# Patient Record
Sex: Female | Born: 1965 | Race: White | Hispanic: No | Marital: Married | Smoking: Never smoker
Health system: Southern US, Community
[De-identification: ages and names within clinical notes are randomized; demographics above are authoritative.]

## PROBLEM LIST (undated history)

## (undated) DIAGNOSIS — N946 Dysmenorrhea, unspecified: Secondary | ICD-10-CM

## (undated) DIAGNOSIS — F988 Other specified behavioral and emotional disorders with onset usually occurring in childhood and adolescence: Secondary | ICD-10-CM

## (undated) DIAGNOSIS — T7840XA Allergy, unspecified, initial encounter: Secondary | ICD-10-CM

## (undated) DIAGNOSIS — F419 Anxiety disorder, unspecified: Secondary | ICD-10-CM

## (undated) HISTORY — DX: Allergy, unspecified, initial encounter: T78.40XA

## (undated) HISTORY — DX: Other specified behavioral and emotional disorders with onset usually occurring in childhood and adolescence: F98.8

## (undated) HISTORY — DX: Anxiety disorder, unspecified: F41.9

## (undated) HISTORY — PX: BREAST BIOPSY: SHX20

## (undated) HISTORY — DX: Dysmenorrhea, unspecified: N94.6

---

## 1997-11-30 ENCOUNTER — Other Ambulatory Visit: Admission: RE | Admit: 1997-11-30 | Discharge: 1997-11-30 | Payer: Self-pay | Admitting: Obstetrics and Gynecology

## 1998-12-20 ENCOUNTER — Other Ambulatory Visit: Admission: RE | Admit: 1998-12-20 | Discharge: 1998-12-20 | Payer: Self-pay | Admitting: Obstetrics and Gynecology

## 1999-12-23 ENCOUNTER — Other Ambulatory Visit: Admission: RE | Admit: 1999-12-23 | Discharge: 1999-12-23 | Payer: Self-pay | Admitting: Obstetrics and Gynecology

## 2001-02-11 ENCOUNTER — Other Ambulatory Visit: Admission: RE | Admit: 2001-02-11 | Discharge: 2001-02-11 | Payer: Self-pay | Admitting: Obstetrics and Gynecology

## 2002-11-04 ENCOUNTER — Other Ambulatory Visit: Admission: RE | Admit: 2002-11-04 | Discharge: 2002-11-04 | Payer: Self-pay | Admitting: Obstetrics and Gynecology

## 2003-12-12 ENCOUNTER — Ambulatory Visit: Payer: Self-pay | Admitting: Internal Medicine

## 2004-03-11 ENCOUNTER — Ambulatory Visit: Payer: Self-pay | Admitting: Internal Medicine

## 2004-03-11 ENCOUNTER — Ambulatory Visit (HOSPITAL_COMMUNITY): Admission: RE | Admit: 2004-03-11 | Discharge: 2004-03-11 | Payer: Self-pay | Admitting: Internal Medicine

## 2004-04-10 ENCOUNTER — Ambulatory Visit: Payer: Self-pay | Admitting: Internal Medicine

## 2004-08-13 ENCOUNTER — Other Ambulatory Visit: Admission: RE | Admit: 2004-08-13 | Discharge: 2004-08-13 | Payer: Self-pay | Admitting: Obstetrics and Gynecology

## 2004-08-14 ENCOUNTER — Ambulatory Visit: Payer: Self-pay | Admitting: Internal Medicine

## 2004-11-08 ENCOUNTER — Ambulatory Visit (HOSPITAL_COMMUNITY): Admission: RE | Admit: 2004-11-08 | Discharge: 2004-11-08 | Payer: Self-pay | Admitting: Obstetrics and Gynecology

## 2004-11-15 ENCOUNTER — Ambulatory Visit: Payer: Self-pay | Admitting: Internal Medicine

## 2005-01-08 ENCOUNTER — Ambulatory Visit: Payer: Self-pay | Admitting: Internal Medicine

## 2005-05-14 ENCOUNTER — Ambulatory Visit: Payer: Self-pay | Admitting: Internal Medicine

## 2005-09-25 ENCOUNTER — Ambulatory Visit: Payer: Self-pay | Admitting: Internal Medicine

## 2005-11-13 ENCOUNTER — Other Ambulatory Visit: Admission: RE | Admit: 2005-11-13 | Discharge: 2005-11-13 | Payer: Self-pay | Admitting: Obstetrics and Gynecology

## 2006-01-13 HISTORY — PX: BREAST BIOPSY: SHX20

## 2006-02-17 ENCOUNTER — Ambulatory Visit: Payer: Self-pay | Admitting: Internal Medicine

## 2006-03-24 ENCOUNTER — Ambulatory Visit (HOSPITAL_COMMUNITY): Admission: RE | Admit: 2006-03-24 | Discharge: 2006-03-24 | Payer: Self-pay | Admitting: Obstetrics and Gynecology

## 2006-03-31 ENCOUNTER — Encounter: Admission: RE | Admit: 2006-03-31 | Discharge: 2006-03-31 | Payer: Self-pay | Admitting: Obstetrics and Gynecology

## 2006-04-01 ENCOUNTER — Encounter: Admission: RE | Admit: 2006-04-01 | Discharge: 2006-04-01 | Payer: Self-pay | Admitting: Obstetrics and Gynecology

## 2006-04-01 ENCOUNTER — Encounter (INDEPENDENT_AMBULATORY_CARE_PROVIDER_SITE_OTHER): Payer: Self-pay | Admitting: *Deleted

## 2006-06-29 ENCOUNTER — Ambulatory Visit: Payer: Self-pay | Admitting: Internal Medicine

## 2006-11-18 ENCOUNTER — Encounter: Payer: Self-pay | Admitting: Internal Medicine

## 2006-11-18 DIAGNOSIS — F988 Other specified behavioral and emotional disorders with onset usually occurring in childhood and adolescence: Secondary | ICD-10-CM | POA: Insufficient documentation

## 2006-11-18 DIAGNOSIS — J309 Allergic rhinitis, unspecified: Secondary | ICD-10-CM | POA: Insufficient documentation

## 2006-12-22 ENCOUNTER — Ambulatory Visit: Payer: Self-pay | Admitting: Internal Medicine

## 2007-03-23 ENCOUNTER — Ambulatory Visit: Payer: Self-pay | Admitting: Internal Medicine

## 2007-07-08 ENCOUNTER — Telehealth (INDEPENDENT_AMBULATORY_CARE_PROVIDER_SITE_OTHER): Payer: Self-pay | Admitting: *Deleted

## 2007-07-23 ENCOUNTER — Ambulatory Visit: Payer: Self-pay | Admitting: Internal Medicine

## 2007-10-19 ENCOUNTER — Ambulatory Visit: Payer: Self-pay | Admitting: Internal Medicine

## 2007-10-20 LAB — CONVERTED CEMR LAB
ALT: 14 units/L (ref 0–35)
AST: 22 units/L (ref 0–37)
Albumin: 4.3 g/dL (ref 3.5–5.2)
Alkaline Phosphatase: 39 units/L (ref 39–117)
BUN: 8 mg/dL (ref 6–23)
Basophils Absolute: 0.1 10*3/uL (ref 0.0–0.1)
Basophils Relative: 1.3 % (ref 0.0–3.0)
Bilirubin, Direct: 0.2 mg/dL (ref 0.0–0.3)
CO2: 31 meq/L (ref 19–32)
Calcium: 9.2 mg/dL (ref 8.4–10.5)
Chloride: 103 meq/L (ref 96–112)
Cholesterol: 201 mg/dL (ref 0–200)
Creatinine, Ser: 0.8 mg/dL (ref 0.4–1.2)
Direct LDL: 89.9 mg/dL
Eosinophils Absolute: 0.1 10*3/uL (ref 0.0–0.7)
Eosinophils Relative: 0.8 % (ref 0.0–5.0)
GFR calc Af Amer: 100 mL/min
GFR calc non Af Amer: 83 mL/min
Glucose, Bld: 105 mg/dL — ABNORMAL HIGH (ref 70–99)
HCT: 37.8 % (ref 36.0–46.0)
HDL: 88.5 mg/dL (ref >39.0)
Hemoglobin: 13.4 g/dL (ref 12.0–15.0)
Lymphocytes Relative: 35.9 % (ref 12.0–46.0)
MCHC: 35.6 g/dL (ref 30.0–36.0)
MCV: 92.4 fL (ref 78.0–100.0)
Monocytes Absolute: 0.4 10*3/uL (ref 0.1–1.0)
Monocytes Relative: 5.7 % (ref 3.0–12.0)
Neutro Abs: 4 10*3/uL (ref 1.4–7.7)
Neutrophils Relative %: 56.3 % (ref 43.0–77.0)
Platelets: 395 10*3/uL (ref 150–400)
Potassium: 4.2 meq/L (ref 3.5–5.1)
RBC: 4.08 M/uL (ref 3.87–5.11)
RDW: 11.3 % — ABNORMAL LOW (ref 11.5–14.6)
Sodium: 139 meq/L (ref 135–145)
TSH: 1.94 microintl units/mL (ref 0.35–5.50)
Total Bilirubin: 0.8 mg/dL (ref 0.3–1.2)
Total CHOL/HDL Ratio: 2.3
Total Protein: 7 g/dL (ref 6.0–8.3)
Triglycerides: 20 mg/dL (ref 0–149)
VLDL: 4 mg/dL (ref 0–40)
WBC: 7.1 10*3/uL (ref 4.5–10.5)

## 2007-10-25 ENCOUNTER — Ambulatory Visit: Payer: Self-pay | Admitting: Internal Medicine

## 2008-03-13 ENCOUNTER — Ambulatory Visit: Payer: Self-pay | Admitting: Internal Medicine

## 2008-03-13 DIAGNOSIS — L719 Rosacea, unspecified: Secondary | ICD-10-CM | POA: Insufficient documentation

## 2008-03-13 DIAGNOSIS — R21 Rash and other nonspecific skin eruption: Secondary | ICD-10-CM | POA: Insufficient documentation

## 2008-06-26 ENCOUNTER — Ambulatory Visit: Payer: Self-pay | Admitting: Internal Medicine

## 2008-06-30 ENCOUNTER — Telehealth: Payer: Self-pay | Admitting: Internal Medicine

## 2008-07-04 ENCOUNTER — Telehealth: Payer: Self-pay | Admitting: Internal Medicine

## 2008-10-24 ENCOUNTER — Ambulatory Visit: Payer: Self-pay | Admitting: Internal Medicine

## 2009-01-13 HISTORY — PX: LASER ABLATION: SHX1947

## 2009-02-05 ENCOUNTER — Ambulatory Visit: Payer: Self-pay | Admitting: Internal Medicine

## 2009-05-09 ENCOUNTER — Ambulatory Visit: Payer: Self-pay | Admitting: Internal Medicine

## 2009-05-10 ENCOUNTER — Ambulatory Visit: Payer: Self-pay | Admitting: Obstetrics and Gynecology

## 2009-05-10 ENCOUNTER — Other Ambulatory Visit: Admission: RE | Admit: 2009-05-10 | Discharge: 2009-05-10 | Payer: Self-pay | Admitting: Obstetrics and Gynecology

## 2009-05-22 ENCOUNTER — Ambulatory Visit: Payer: Self-pay | Admitting: Obstetrics and Gynecology

## 2009-06-08 ENCOUNTER — Ambulatory Visit: Payer: Self-pay | Admitting: Obstetrics and Gynecology

## 2009-06-29 ENCOUNTER — Ambulatory Visit: Payer: Self-pay | Admitting: Obstetrics and Gynecology

## 2009-07-04 ENCOUNTER — Ambulatory Visit: Payer: Self-pay | Admitting: Obstetrics and Gynecology

## 2009-07-04 HISTORY — PX: ENDOMETRIAL ABLATION: SHX621

## 2009-07-19 ENCOUNTER — Ambulatory Visit: Payer: Self-pay | Admitting: Obstetrics and Gynecology

## 2009-09-11 ENCOUNTER — Ambulatory Visit: Payer: Self-pay | Admitting: Internal Medicine

## 2009-09-11 LAB — CONVERTED CEMR LAB
Cholesterol, target level: 200 mg/dL
HDL goal, serum: 40 mg/dL
LDL Goal: 160 mg/dL

## 2009-12-13 ENCOUNTER — Ambulatory Visit: Payer: Self-pay | Admitting: Internal Medicine

## 2010-02-12 NOTE — Assessment & Plan Note (Signed)
Summary: 3 MO ROV /NWS #   Vital Signs:  Patient profile:   45 year old female Weight:      144 pounds Temp:     98.4 degrees F oral Pulse rate:   83 / minute BP sitting:   116 / 72  (left arm)  Vitals Entered By: Tora Perches (February 05, 2009 2:55 PM) CC: f/u Is Patient Diabetic? No   CC:  f/u.  History of Present Illness: The patient presents for a follow up of ADD.   Preventive Screening-Counseling & Management  Alcohol-Tobacco     Smoking Status: never  Current Medications (verified): 1)  Adderall 20 Mg  Tabs (Amphetamine-Dextroamphetamine) .... Take 1 By Mouth Two Times A Day Please Fill On or After 12/24/08 2)  Prozac 20 Mg  Caps (Fluoxetine Hcl) .Marland Kitchen.. 1 By Mouth Once Daily Prn 3)  Tramadol Hcl 50 Mg  Tabs (Tramadol Hcl) .Marland Kitchen.. 1or2 Two Times A Day  Prn 4)  Loratadine 10 Mg  Tabs (Loratadine) .... Once Daily As Needed Allergies 5)  Vitamin D3 1000 Unit  Tabs (Cholecalciferol) .Marland Kitchen.. 1 Qd 6)  Triamcinolone Acetonide 0.5 % Crea (Triamcinolone Acetonide) .... Apply Bid To Affected Area 7)  Clindamycin Phosphate 1 % Lotn (Clindamycin Phosphate) .... Use Two Times A Day On Face 8)  Singulair 10 Mg Tabs (Montelukast Sodium) .Marland Kitchen.. 1 By Mouth Daily  Allergies (verified): No Known Drug Allergies  Past History:  Past Medical History: Last updated: 12/22/2006 Allergic rhinitis Osteoporosis ADD Menstrual cramps  Social History: Last updated: 12/22/2006 Occupation: Insurance Married Former Smoker  Past Surgical History: Denies surgical history  Review of Systems  The patient denies fever, chest pain, syncope, dyspnea on exertion, and abdominal pain.    Physical Exam  General:  Well-developed,well-nourished,in no acute distress; alert,appropriate and cooperative throughout examination Eyes:  No corneal or conjunctival inflammation noted. EOMI. Perrla.  Nose:  External nasal examination shows no deformity or inflammation. Nasal mucosa are pink and moist without  lesions or exudates. Mouth:  WNL Lungs:  Normal respiratory effort, chest expands symmetrically. Lungs are clear to auscultation, no crackles or wheezes. Heart:  Slight tachy Abdomen:  Bowel sounds positive,abdomen soft and non-tender without masses, organomegaly or hernias noted. Msk:  No deformity or scoliosis noted of thoracic or lumbar spine.   Neurologic:  No cranial nerve deficits noted. Station and gait are normal. Plantar reflexes are down-going bilaterally. DTRs are symmetrical throughout. Sensory, motor and coordinative functions appear intact. Skin:  WNL Psych:  Cognition and judgment appear intact. Alert and cooperative with normal attention span and concentration. No apparent delusions, illusions, hallucinations   Impression & Recommendations:  Problem # 1:  ATTENTION DEFICIT DISORDER (ICD-314.00) Assessment Unchanged On prescription therapy   Problem # 2:  MENSTRUAL PAIN (ICD-625.3) Assessment: Comment Only On prescription therapy   Complete Medication List: 1)  Adderall 20 Mg Tabs (Amphetamine-dextroamphetamine) .... Take 1 by mouth two times a day please fill on or after 03/24/09 2)  Prozac 20 Mg Caps (Fluoxetine hcl) .Marland Kitchen.. 1 by mouth once daily prn 3)  Tramadol Hcl 50 Mg Tabs (Tramadol hcl) .Marland Kitchen.. 1or2 two times a day  prn 4)  Loratadine 10 Mg Tabs (Loratadine) .... Once daily as needed allergies 5)  Vitamin D3 1000 Unit Tabs (Cholecalciferol) .Marland Kitchen.. 1 qd 6)  Triamcinolone Acetonide 0.5 % Crea (Triamcinolone acetonide) .... Apply bid to affected area 7)  Clindamycin Phosphate 1 % Lotn (Clindamycin phosphate) .... Use two times a day on face 8)  Singulair  10 Mg Tabs (Montelukast sodium) .Marland Kitchen.. 1 by mouth daily  Patient Instructions: 1)  Please schedule a follow-up appointment in 3 months. Prescriptions: ADDERALL 20 MG  TABS (AMPHETAMINE-DEXTROAMPHETAMINE) take 1 by mouth two times a day Please fill on or after 03/24/09  #60 x 0   Entered and Authorized by:   Tresa Garter MD   Signed by:   Tresa Garter MD on 02/05/2009   Method used:   Print then Give to Patient   RxID:   [id number] ADDERALL 20 MG  TABS (AMPHETAMINE-DEXTROAMPHETAMINE) take 1 by mouth two times a day Please fill on or after 04/24/09  #60 x 0   Entered and Authorized by:   Tresa Garter MD   Signed by:   Tresa Garter MD on 02/05/2009   Method used:   Print then Give to Patient   RxID:   [id number] ADDERALL 20 MG  TABS (AMPHETAMINE-DEXTROAMPHETAMINE) take 1 by mouth two times a day Please fill on or after 02/24/09  #60 x 0   Entered and Authorized by:   Tresa Garter MD   Signed by:   Tresa Garter MD on 02/05/2009   Method used:   Print then Give to Patient   RxID:   [id number] ADDERALL 20 MG  TABS (AMPHETAMINE-DEXTROAMPHETAMINE) take 1 by mouth two times a day Please fill on or after 02/25/08  #60 x 0   Entered and Authorized by:   Tresa Garter MD   Signed by:   Tresa Garter MD on 02/05/2009   Method used:   Print then Give to Patient   RxID:   [id number]

## 2010-02-12 NOTE — Assessment & Plan Note (Signed)
Summary: 3 MO ROV /NWS  #   Vital Signs:  Patient profile:   45 year old female Height:      67 inches Weight:      145.38 pounds BMI:     22.85 O2 Sat:      97 % on Room air Temp:     97.8 degrees F oral Pulse rate:   95 / minute BP sitting:   90 / 56  (left arm)  Vitals Entered By: Lucious Groves (May 09, 2009 9:41 AM)  O2 Flow:  Room air CC: 3 mo rtn ov./kb Is Patient Diabetic? No Pain Assessment Patient in pain? no        CC:  3 mo rtn ov./kb.  History of Present Illness: The patient presents for a follow up of ADD, depression C/o allergies - bad   Current Medications (verified): 1)  Adderall 20 Mg  Tabs (Amphetamine-Dextroamphetamine) .... Take 1 By Mouth Two Times A Day Please Fill On or After 03/24/09 2)  Prozac 20 Mg  Caps (Fluoxetine Hcl) .Marland Kitchen.. 1 By Mouth Once Daily Prn 3)  Tramadol Hcl 50 Mg  Tabs (Tramadol Hcl) .Marland Kitchen.. 1or2 Two Times A Day  Prn 4)  Loratadine 10 Mg  Tabs (Loratadine) .... Once Daily As Needed Allergies 5)  Vitamin D3 1000 Unit  Tabs (Cholecalciferol) .Marland Kitchen.. 1 Qd 6)  Triamcinolone Acetonide 0.5 % Crea (Triamcinolone Acetonide) .... Apply Bid To Affected Area 7)  Clindamycin Phosphate 1 % Lotn (Clindamycin Phosphate) .... Use Two Times A Day On Face 8)  Singulair 10 Mg Tabs (Montelukast Sodium) .Marland Kitchen.. 1 By Mouth Daily  Allergies (verified): No Known Drug Allergies  Past History:  Past Medical History: Last updated: 12/22/2006 Allergic rhinitis Osteoporosis ADD Menstrual cramps  Social History: Last updated: 12/22/2006 Occupation: Insurance Married Former Smoker  Review of Systems  The patient denies chest pain and dyspnea on exertion.    Physical Exam  General:  Well-developed,well-nourished,in no acute distress; alert,appropriate and cooperative throughout examination Nose:  External nasal examination shows no deformity or inflammation. Nasal mucosa are pink and moist without lesions or exudates. Mouth:  WNL Lungs:  Normal  respiratory effort, chest expands symmetrically. Lungs are clear to auscultation, no crackles or wheezes. Heart:  Slight tachy Abdomen:  Bowel sounds positive,abdomen soft and non-tender without masses, organomegaly or hernias noted. Msk:  No deformity or scoliosis noted of thoracic or lumbar spine.   Neurologic:  No cranial nerve deficits noted. Station and gait are normal. Plantar reflexes are down-going bilaterally. DTRs are symmetrical throughout. Sensory, motor and coordinative functions appear intact. Skin:  WNL Psych:  Cognition and judgment appear intact. Alert and cooperative with normal attention span and concentration. No apparent delusions, illusions, hallucinations   Impression & Recommendations:  Problem # 1:  ALLERGIC RHINITIS (ICD-477.9) Assessment Deteriorated  Her updated medication list for this problem includes:    Loratadine 10 Mg Tabs (Loratadine) ..... Once daily as needed allergies  Problem # 2:  ATTENTION DEFICIT DISORDER (ICD-314.00) Assessment: Unchanged Labs w/GYN Rx given  Problem # 3:  MENSTRUAL PAIN (ICD-625.3) Assessment: Unchanged On Rx PRN  Complete Medication List: 1)  Adderall 20 Mg Tabs (Amphetamine-dextroamphetamine) .... Take 1 by mouth two times a day please fill on or after 07/24/09 2)  Prozac 20 Mg Caps (Fluoxetine hcl) .Marland Kitchen.. 1 by mouth once daily prn 3)  Tramadol Hcl 50 Mg Tabs (Tramadol hcl) .Marland Kitchen.. 1or2 two times a day  prn 4)  Loratadine 10 Mg Tabs (Loratadine) .... Once  daily as needed allergies 5)  Vitamin D3 1000 Unit Tabs (Cholecalciferol) .Marland Kitchen.. 1 qd 6)  Triamcinolone Acetonide 0.5 % Crea (Triamcinolone acetonide) .... Apply bid to affected area 7)  Clindamycin Phosphate 1 % Lotn (Clindamycin phosphate) .... Use two times a day on face 8)  Singulair 10 Mg Tabs (Montelukast sodium) .Marland Kitchen.. 1 by mouth daily  Patient Instructions: 1)  Please schedule a follow-up appointment in 3 months. Prescriptions: SINGULAIR 10 MG TABS (MONTELUKAST  SODIUM) 1 by mouth daily  #30 x 6   Entered and Authorized by:   Tresa Garter MD   Signed by:   Tresa Garter MD on 05/09/2009   Method used:   Print then Give to Patient   RxID:   [id number] TRAMADOL HCL 50 MG  TABS (TRAMADOL HCL) 1or2 two times a day  prn  #120 x 3   Entered and Authorized by:   Tresa Garter MD   Signed by:   Tresa Garter MD on 05/09/2009   Method used:   Print then Give to Patient   RxID:   [id number] PROZAC 20 MG  CAPS (FLUOXETINE HCL) 1 by mouth once daily prn  #90 x 0   Entered and Authorized by:   Tresa Garter MD   Signed by:   Tresa Garter MD on 05/09/2009   Method used:   Print then Give to Patient   RxID:   [id number] ADDERALL 20 MG  TABS (AMPHETAMINE-DEXTROAMPHETAMINE) take 1 by mouth two times a day Please fill on or after 07/24/09  #60 x 0   Entered and Authorized by:   Tresa Garter MD   Signed by:   Tresa Garter MD on 05/09/2009   Method used:   Print then Give to Patient   RxID:   [id number] ADDERALL 20 MG  TABS (AMPHETAMINE-DEXTROAMPHETAMINE) take 1 by mouth two times a day Please fill on or after 06/24/09  #60 x 0   Entered and Authorized by:   Tresa Garter MD   Signed by:   Tresa Garter MD on 05/09/2009   Method used:   Print then Give to Patient   RxID:   [id number] ADDERALL 20 MG  TABS (AMPHETAMINE-DEXTROAMPHETAMINE) take 1 by mouth two times a day Please fill on or after 05/24/09  #60 x 0   Entered and Authorized by:   Tresa Garter MD   Signed by:   Tresa Garter MD on 05/09/2009   Method used:   Print then Give to Patient   RxID:   [id number]

## 2010-02-12 NOTE — Assessment & Plan Note (Signed)
Summary: 3 mos f/u #/ cd   Vital Signs:  Patient profile:   45 year old female Height:      67 inches (170.18 cm) Weight:      147.50 pounds (67.05 kg) BMI:     23.19 O2 Sat:      98 % on Room air Temp:     98.4 degrees F (36.89 degrees C) oral Pulse rate:   93 / minute BP sitting:   110 / 70  (left arm) Cuff size:   regular  Vitals Entered By: Lucious Groves CMA (September 11, 2009 9:23 AM)  O2 Flow:  Room air CC: 3 mo fu./kb, Lipid Management Is Patient Diabetic? No Pain Assessment Patient in pain? no        CC:  3 mo fu./kb and Lipid Management.  History of Present Illness: F/u ADD  Lipid Management History:      Negative NCEP/ATP III risk factors include female age less than 36 years old, HDL cholesterol greater than 60, and non-tobacco-user status.    Current Medications (verified): 1)  Adderall 20 Mg  Tabs (Amphetamine-Dextroamphetamine) .... Take 1 By Mouth Two Times A Day Please Fill On or After 07/24/09 2)  Prozac 20 Mg  Caps (Fluoxetine Hcl) .Marland Kitchen.. 1 By Mouth Once Daily Prn 3)  Tramadol Hcl 50 Mg  Tabs (Tramadol Hcl) .Marland Kitchen.. 1or2 Two Times A Day  Prn 4)  Loratadine 10 Mg  Tabs (Loratadine) .... Once Daily As Needed Allergies 5)  Vitamin D3 1000 Unit  Tabs (Cholecalciferol) .Marland Kitchen.. 1 Qd 6)  Triamcinolone Acetonide 0.5 % Crea (Triamcinolone Acetonide) .... Apply Bid To Affected Area 7)  Clindamycin Phosphate 1 % Lotn (Clindamycin Phosphate) .... Use Two Times A Day On Face 8)  Singulair 10 Mg Tabs (Montelukast Sodium) .Marland Kitchen.. 1 By Mouth Daily  Allergies (verified): No Known Drug Allergies  Past History:  Past Medical History: Last updated: 12/22/2006 Allergic rhinitis Osteoporosis ADD Menstrual cramps  Social History: Last updated: 12/22/2006 Occupation: Insurance Married Former Smoker  Past Surgical History: Uterine ablation 2011 Dr Arnette Norris  Physical Exam  General:  Well-developed,well-nourished,in no acute distress; alert,appropriate and cooperative  throughout examination Mouth:  WNL Lungs:  Normal respiratory effort, chest expands symmetrically. Lungs are clear to auscultation, no crackles or wheezes. Heart:  Slight tachy Abdomen:  Bowel sounds positive,abdomen soft and non-tender without masses, organomegaly or hernias noted. Msk:  No deformity or scoliosis noted of thoracic or lumbar spine.   Neurologic:  No cranial nerve deficits noted. Station and gait are normal. Plantar reflexes are down-going bilaterally. DTRs are symmetrical throughout. Sensory, motor and coordinative functions appear intact.   Impression & Recommendations:  Problem # 1:  ATTENTION DEFICIT DISORDER (ICD-314.00) Assessment Unchanged On the regimen of medicine(s) reflected in the chart    Problem # 2:  MENSTRUAL PAIN (ICD-625.3) Assessment: Improved Had ablation and PAP and labs w/Dr Eda Paschal  Complete Medication List: 1)  Adderall 20 Mg Tabs (Amphetamine-dextroamphetamine) .... Take 1 by mouth two times a day please fill on or after 10/24/09 2)  Prozac 20 Mg Caps (Fluoxetine hcl) .Marland Kitchen.. 1 by mouth once daily prn 3)  Tramadol Hcl 50 Mg Tabs (Tramadol hcl) .Marland Kitchen.. 1or2 two times a day  prn 4)  Loratadine 10 Mg Tabs (Loratadine) .... Once daily as needed allergies 5)  Vitamin D3 1000 Unit Tabs (Cholecalciferol) .Marland Kitchen.. 1 qd 6)  Triamcinolone Acetonide 0.5 % Crea (Triamcinolone acetonide) .... Apply bid to affected area 7)  Clindamycin Phosphate 1 % Lotn (Clindamycin  phosphate) .... Use two times a day on face 8)  Singulair 10 Mg Tabs (Montelukast sodium) .Marland Kitchen.. 1 by mouth daily  Lipid Assessment/Plan:      Based on NCEP/ATP III, the patient's risk factor category is "0-1 risk factors".  The patient's lipid goals are as follows: Total cholesterol goal is 200; LDL cholesterol goal is 160; HDL cholesterol goal is 40; Triglyceride goal is 150.     Patient Instructions: 1)  Please schedule a follow-up appointment in 3 months. Prescriptions: ADDERALL 20 MG  TABS  (AMPHETAMINE-DEXTROAMPHETAMINE) take 1 by mouth two times a day Please fill on or after 10/24/09  #60 x 0   Entered and Authorized by:   Tresa Garter MD   Signed by:   Tresa Garter MD on 09/11/2009   Method used:   Print then Give to Patient   RxID:   [id number] ADDERALL 20 MG  TABS (AMPHETAMINE-DEXTROAMPHETAMINE) take 1 by mouth two times a day Please fill on or after 09/24/09  #60 x 0   Entered and Authorized by:   Tresa Garter MD   Signed by:   Tresa Garter MD on 09/11/2009   Method used:   Print then Give to Patient   RxID:   [id number] ADDERALL 20 MG  TABS (AMPHETAMINE-DEXTROAMPHETAMINE) take 1 by mouth two times a day Please fill on or after 08/24/09  #60 x 0   Entered and Authorized by:   Tresa Garter MD   Signed by:   Tresa Garter MD on 09/11/2009   Method used:   Print then Give to Patient   RxID:   [id number]     Contraindications/Deferment of Procedures/Staging:    Test/Procedure: FLU VAX    Reason for deferment: patient declined

## 2010-02-12 NOTE — Assessment & Plan Note (Signed)
Summary: 3 MO ROV /NWS #   Vital Signs:  Patient profile:   45 year old female Height:      67 inches Weight:      150 pounds BMI:     23.58 Temp:     98.5 degrees F oral Pulse rate:   80 / minute Pulse rhythm:   regular Resp:     16 per minute BP sitting:   100 / 70  (left arm) Cuff size:   regular  Vitals Entered By: Lanier Prude, CMA(AAMA) (December 13, 2009 10:13 AM) CC: 3 mo f/u    CC:  3 mo f/u .  History of Present Illness: The patient presents for a follow up of ADD  Current Medications (verified): 1)  Adderall 20 Mg  Tabs (Amphetamine-Dextroamphetamine) .... Take 1 By Mouth Two Times A Day Please Fill On or After 10/24/09 2)  Prozac 20 Mg  Caps (Fluoxetine Hcl) .Marland Kitchen.. 1 By Mouth Once Daily Prn 3)  Tramadol Hcl 50 Mg  Tabs (Tramadol Hcl) .Marland Kitchen.. 1or2 Two Times A Day  Prn 4)  Loratadine 10 Mg  Tabs (Loratadine) .... Once Daily As Needed Allergies 5)  Vitamin D3 1000 Unit  Tabs (Cholecalciferol) .Marland Kitchen.. 1 Qd 6)  Triamcinolone Acetonide 0.5 % Crea (Triamcinolone Acetonide) .... Apply Bid To Affected Area 7)  Clindamycin Phosphate 1 % Lotn (Clindamycin Phosphate) .... Use Two Times A Day On Face 8)  Singulair 10 Mg Tabs (Montelukast Sodium) .Marland Kitchen.. 1 By Mouth Daily  Allergies (verified): No Known Drug Allergies  Past History:  Past Medical History: Last updated: 12/22/2006 Allergic rhinitis Osteoporosis ADD Menstrual cramps  Social History: Last updated: 12/22/2006 Occupation: Insurance Married Former Smoker  Review of Systems  The patient denies fever, chest pain, syncope, dyspnea on exertion, and depression.    Physical Exam  General:  Well-developed,well-nourished,in no acute distress; alert,appropriate and cooperative throughout examination Mouth:  WNL Lungs:  Normal respiratory effort, chest expands symmetrically. Lungs are clear to auscultation, no crackles or wheezes. Heart:  Slight tachy Abdomen:  Bowel sounds positive,abdomen soft and non-tender  without masses, organomegaly or hernias noted. Msk:  No deformity or scoliosis noted of thoracic or lumbar spine.   Neurologic:  No cranial nerve deficits noted. Station and gait are normal. Plantar reflexes are down-going bilaterally. DTRs are symmetrical throughout. Sensory, motor and coordinative functions appear intact. Skin:  WNL Psych:  Cognition and judgment appear intact. Alert and cooperative with normal attention span and concentration. No apparent delusions, illusions, hallucinations   Impression & Recommendations:  Problem # 1:  ATTENTION DEFICIT DISORDER (ICD-314.00) Assessment Unchanged She had well visit and labs w/her GYN  Complete Medication List: 1)  Adderall 20 Mg Tabs (Amphetamine-dextroamphetamine) .... Take 1 by mouth two times a day please fill on or after 01/24/10 2)  Prozac 20 Mg Caps (Fluoxetine hcl) .Marland Kitchen.. 1 by mouth once daily prn 3)  Tramadol Hcl 50 Mg Tabs (Tramadol hcl) .Marland Kitchen.. 1or2 two times a day  prn 4)  Loratadine 10 Mg Tabs (Loratadine) .... Once daily as needed allergies 5)  Vitamin D3 1000 Unit Tabs (Cholecalciferol) .Marland Kitchen.. 1 qd 6)  Triamcinolone Acetonide 0.5 % Crea (Triamcinolone acetonide) .... Apply bid to affected area 7)  Clindamycin Phosphate 1 % Lotn (Clindamycin phosphate) .... Use two times a day on face 8)  Singulair 10 Mg Tabs (Montelukast sodium) .Marland Kitchen.. 1 by mouth daily  Patient Instructions: 1)  Please schedule a follow-up appointment in 3 months. Prescriptions: ADDERALL 20 MG  TABS (  AMPHETAMINE-DEXTROAMPHETAMINE) take 1 by mouth two times a day Please fill on or after 01/24/10  #60 x 0   Entered and Authorized by:   Tresa Garter MD   Signed by:   Tresa Garter MD on 12/13/2009   Method used:   Print then Give to Patient   RxID:   [id number] ADDERALL 20 MG  TABS (AMPHETAMINE-DEXTROAMPHETAMINE) take 1 by mouth two times a day Please fill on or after 12/24/09  #60 x 0   Entered and Authorized by:   Tresa Garter MD    Signed by:   Tresa Garter MD on 12/13/2009   Method used:   Print then Give to Patient   RxID:   [id number] ADDERALL 20 MG  TABS (AMPHETAMINE-DEXTROAMPHETAMINE) take 1 by mouth two times a day Please fill on or after 11/24/09  #60 x 0   Entered and Authorized by:   Tresa Garter MD   Signed by:   Tresa Garter MD on 12/13/2009   Method used:   Print then Give to Patient   RxID:   [id number]    Orders Added: 1)  Est. Patient Level III [76283]

## 2010-03-19 ENCOUNTER — Ambulatory Visit: Payer: Self-pay | Admitting: Internal Medicine

## 2010-04-08 ENCOUNTER — Encounter: Payer: Self-pay | Admitting: Internal Medicine

## 2010-04-08 ENCOUNTER — Ambulatory Visit (INDEPENDENT_AMBULATORY_CARE_PROVIDER_SITE_OTHER): Payer: PRIVATE HEALTH INSURANCE | Admitting: Internal Medicine

## 2010-04-08 DIAGNOSIS — J309 Allergic rhinitis, unspecified: Secondary | ICD-10-CM

## 2010-04-08 DIAGNOSIS — N946 Dysmenorrhea, unspecified: Secondary | ICD-10-CM | POA: Insufficient documentation

## 2010-04-08 DIAGNOSIS — F988 Other specified behavioral and emotional disorders with onset usually occurring in childhood and adolescence: Secondary | ICD-10-CM

## 2010-04-08 MED ORDER — FLUOXETINE HCL 20 MG PO CAPS: 20.0000 mg | ORAL_CAPSULE | Freq: Every day | ORAL | Status: DC

## 2010-04-08 MED ORDER — AMPHETAMINE-DEXTROAMPHETAMINE 20 MG PO TABS: 1.0000 | ORAL_TABLET | Freq: Two times a day (BID) | ORAL | Status: DC

## 2010-04-08 NOTE — Assessment & Plan Note (Signed)
Cont Rx 

## 2010-04-08 NOTE — Progress Notes (Signed)
  Subjective:    Patient ID: Heather Freeman, female    DOB: Aug 05, 1965, 45 y.o.   MRN: [id number]  HPI  F/u ADD, osteopenia, allergies and cramps  Review of Systems  Constitutional: Negative.  Negative for fever, chills, diaphoresis, activity change, appetite change, fatigue and unexpected weight change.  HENT: Negative for hearing loss, ear pain, nosebleeds, congestion, sore throat, facial swelling, rhinorrhea, sneezing, mouth sores, trouble swallowing, neck pain, neck stiffness, postnasal drip, sinus pressure and tinnitus.   Eyes: Negative for pain, discharge, redness, itching and visual disturbance.  Respiratory: Negative for cough, chest tightness, shortness of breath, wheezing and stridor.   Cardiovascular: Negative for chest pain, palpitations and leg swelling.       Tachycardia  Gastrointestinal: Negative for nausea, diarrhea, constipation, blood in stool, abdominal distention, anal bleeding and rectal pain.  Genitourinary: Negative for dysuria, urgency, frequency, hematuria, flank pain, vaginal bleeding, vaginal discharge, difficulty urinating, genital sores and pelvic pain.  Musculoskeletal: Negative for back pain, joint swelling, arthralgias and gait problem.  Skin: Negative.  Negative for rash.  Neurological: Negative for dizziness, tremors, seizures, syncope, speech difficulty, weakness, numbness and headaches.  Hematological: Negative for adenopathy. Does not bruise/bleed easily.  Psychiatric/Behavioral: Negative for suicidal ideas, behavioral problems, sleep disturbance, dysphoric mood and decreased concentration. The patient is not nervous/anxious.        Objective:   Physical Exam  Constitutional: She appears well-developed and well-nourished. No distress.  HENT:  Head: Normocephalic.  Right Ear: External ear normal.  Left Ear: External ear normal.  Nose: Nose normal.  Mouth/Throat: Oropharynx is clear and moist.  Eyes: Conjunctivae are normal. Pupils are equal,  round, and reactive to light. Right eye exhibits no discharge. Left eye exhibits no discharge.  Neck: Normal range of motion. Neck supple. No JVD present. No tracheal deviation present. No thyromegaly present.  Cardiovascular: Normal rate, regular rhythm and normal heart sounds.   Pulmonary/Chest: No stridor. No respiratory distress. She has no wheezes.  Abdominal: Soft. Bowel sounds are normal. She exhibits no distension and no mass. There is no tenderness. There is no rebound and no guarding.  Musculoskeletal: She exhibits no edema and no tenderness.  Lymphadenopathy:    She has no cervical adenopathy.  Neurological: She displays normal reflexes. No cranial nerve deficit. She exhibits normal muscle tone. Coordination normal.  Skin: No rash noted. No erythema.  Psychiatric: Her behavior is normal. Judgment and thought content normal. She does not express impulsivity or inappropriate judgment. She does not exhibit a depressed mood.          Assessment & Plan:  MENSTRUAL PAIN Cont Rx  ALLERGIC RHINITIS On Loratidine 10 mg daily  ATTENTION DEFICIT DISORDER Meds filled x 3 months    BP Readings from Last 3 Encounters:  04/08/10 104/70  12/13/09 100/70  09/11/09 110/70   Wt Readings from Last 3 Encounters:  04/08/10 153 lb (69.4 kg)  12/13/09 150 lb (68.04 kg)  09/11/09 147 lb 8 oz (66.906 kg)   .fu

## 2010-04-08 NOTE — Assessment & Plan Note (Signed)
On Loratidine 10 mg daily

## 2010-04-08 NOTE — Assessment & Plan Note (Signed)
Meds filled x 3 months

## 2010-07-09 ENCOUNTER — Ambulatory Visit (INDEPENDENT_AMBULATORY_CARE_PROVIDER_SITE_OTHER): Payer: PRIVATE HEALTH INSURANCE | Admitting: Internal Medicine

## 2010-07-09 ENCOUNTER — Encounter: Payer: Self-pay | Admitting: Internal Medicine

## 2010-07-09 DIAGNOSIS — S0003XA Contusion of scalp, initial encounter: Secondary | ICD-10-CM

## 2010-07-09 DIAGNOSIS — S0083XA Contusion of other part of head, initial encounter: Secondary | ICD-10-CM | POA: Insufficient documentation

## 2010-07-09 DIAGNOSIS — L719 Rosacea, unspecified: Secondary | ICD-10-CM

## 2010-07-09 DIAGNOSIS — H0019 Chalazion unspecified eye, unspecified eyelid: Secondary | ICD-10-CM

## 2010-07-09 DIAGNOSIS — N946 Dysmenorrhea, unspecified: Secondary | ICD-10-CM

## 2010-07-09 DIAGNOSIS — F988 Other specified behavioral and emotional disorders with onset usually occurring in childhood and adolescence: Secondary | ICD-10-CM

## 2010-07-09 DIAGNOSIS — Z23 Encounter for immunization: Secondary | ICD-10-CM

## 2010-07-09 DIAGNOSIS — H0011 Chalazion right upper eyelid: Secondary | ICD-10-CM | POA: Insufficient documentation

## 2010-07-09 MED ORDER — NEOMYCIN-POLYMYXIN-HC 3.5-10000-1 OT SOLN: 3.0000 [drp] | Freq: Three times a day (TID) | OTIC | Status: AC

## 2010-07-09 MED ORDER — AMPHETAMINE-DEXTROAMPHETAMINE 20 MG PO TABS: 1.0000 | ORAL_TABLET | Freq: Two times a day (BID) | ORAL | Status: DC

## 2010-07-09 NOTE — Assessment & Plan Note (Signed)
On Rx 

## 2010-07-09 NOTE — Assessment & Plan Note (Signed)
Use heat 

## 2010-07-09 NOTE — Assessment & Plan Note (Signed)
Doing well 

## 2010-07-09 NOTE — Assessment & Plan Note (Signed)
ophth consult

## 2010-07-09 NOTE — Progress Notes (Signed)
  Subjective:    Patient ID: Heather Freeman, female    DOB: 26-Jan-1965, 45 y.o.   MRN: [id number]  HPI  The patient is here for a wellness exam. The patient has been doing well overall without major physical or psychological issues going on lately. C/o R eyelid lesion C/o hit L jaw with a branch  Review of Systems  Constitutional: Negative for chills, activity change, appetite change, fatigue and unexpected weight change.  HENT: Negative for congestion, mouth sores and sinus pressure.   Eyes: Negative for visual disturbance.  Respiratory: Negative for cough and chest tightness.   Gastrointestinal: Negative for nausea and abdominal pain.  Genitourinary: Negative for frequency, difficulty urinating and vaginal pain.  Musculoskeletal: Negative for back pain and gait problem.  Skin: Negative for pallor and rash.  Neurological: Negative for dizziness, tremors, weakness, numbness and headaches.  Psychiatric/Behavioral: Negative for confusion and sleep disturbance.       Objective:   Physical Exam  Constitutional: She appears well-developed and well-nourished. No distress.  HENT:  Head: Normocephalic.  Right Ear: External ear normal.  Left Ear: External ear normal.  Nose: Nose normal.  Mouth/Throat: Oropharynx is clear and moist.  Eyes: Conjunctivae are normal. Pupils are equal, round, and reactive to light. Right eye exhibits no discharge. Left eye exhibits no discharge.    Neck: Normal range of motion. Neck supple. No JVD present. No tracheal deviation present. No thyromegaly present.  Cardiovascular: Normal rate, regular rhythm and normal heart sounds.   Pulmonary/Chest: No stridor. No respiratory distress. She has no wheezes.  Abdominal: Soft. Bowel sounds are normal. She exhibits no distension and no mass. There is no tenderness. There is no rebound and no guarding.  Musculoskeletal: She exhibits no edema and no tenderness.  Lymphadenopathy:    She has no cervical adenopathy.    Neurological: She displays normal reflexes. No cranial nerve deficit. She exhibits normal muscle tone. Coordination normal.  Skin: No rash noted. No erythema.  Psychiatric: She has a normal mood and affect. Her behavior is normal. Judgment and thought content normal.   1 cm hematoma at L jaw angle 1 mm chalazion R upper eyelid       Assessment & Plan:

## 2010-07-09 NOTE — Assessment & Plan Note (Signed)
On Rx. Some better

## 2010-07-10 NOTE — Progress Notes (Signed)
Addended by: Merrilyn Puma on: 07/10/2010 11:58 AM   Modules accepted: Orders

## 2010-10-09 ENCOUNTER — Encounter: Payer: Self-pay | Admitting: Internal Medicine

## 2010-10-09 ENCOUNTER — Ambulatory Visit (INDEPENDENT_AMBULATORY_CARE_PROVIDER_SITE_OTHER): Payer: PRIVATE HEALTH INSURANCE | Admitting: Internal Medicine

## 2010-10-09 DIAGNOSIS — F988 Other specified behavioral and emotional disorders with onset usually occurring in childhood and adolescence: Secondary | ICD-10-CM

## 2010-10-09 DIAGNOSIS — N946 Dysmenorrhea, unspecified: Secondary | ICD-10-CM

## 2010-10-09 DIAGNOSIS — J309 Allergic rhinitis, unspecified: Secondary | ICD-10-CM

## 2010-10-09 MED ORDER — MONTELUKAST SODIUM 10 MG PO TABS: 10.0000 mg | ORAL_TABLET | Freq: Every day | ORAL | Status: DC

## 2010-10-09 MED ORDER — AMPHETAMINE-DEXTROAMPHETAMINE 20 MG PO TABS: 1.0000 | ORAL_TABLET | Freq: Two times a day (BID) | ORAL | Status: DC

## 2010-10-09 NOTE — Assessment & Plan Note (Signed)
Continue with current prescription therapy as reflected on the Med list.  

## 2010-10-09 NOTE — Progress Notes (Signed)
  Subjective:    Patient ID: Heather Freeman, female    DOB: 1965-03-29, 45 y.o.   MRN: [id number]  HPI   The patient is here to follow up on chronic ADD, occasional menstrual symptoms controlled well after ablation C/o allergies  Review of Systems  Constitutional: Negative for chills, activity change, appetite change, fatigue and unexpected weight change.  HENT: Negative for congestion, mouth sores and sinus pressure.   Eyes: Negative for visual disturbance.  Respiratory: Negative for cough and chest tightness.   Gastrointestinal: Negative for nausea and abdominal pain.  Genitourinary: Negative for frequency, difficulty urinating and vaginal pain.  Musculoskeletal: Negative for back pain and gait problem.  Skin: Negative for pallor and rash.  Neurological: Negative for dizziness, tremors, weakness, numbness and headaches.  Psychiatric/Behavioral: Negative for confusion, sleep disturbance and decreased concentration.       Objective:   Physical Exam  Constitutional: She appears well-developed and well-nourished. No distress.  HENT:  Head: Normocephalic.  Right Ear: External ear normal.  Left Ear: External ear normal.  Nose: Nose normal.  Mouth/Throat: Oropharynx is clear and moist.  Eyes: Conjunctivae are normal. Pupils are equal, round, and reactive to light. Right eye exhibits no discharge. Left eye exhibits no discharge.  Neck: Normal range of motion. Neck supple. No JVD present. No tracheal deviation present. No thyromegaly present.  Cardiovascular: Normal rate, regular rhythm and normal heart sounds.   Pulmonary/Chest: No stridor. No respiratory distress. She has no wheezes.  Abdominal: Soft. Bowel sounds are normal. She exhibits no distension and no mass. There is no tenderness. There is no rebound and no guarding.  Musculoskeletal: She exhibits no edema and no tenderness.  Lymphadenopathy:    She has no cervical adenopathy.  Neurological: She displays normal reflexes.  No cranial nerve deficit. She exhibits normal muscle tone. Coordination normal.  Skin: No rash noted. No erythema.  Psychiatric: She has a normal mood and affect. Her behavior is normal. Judgment and thought content normal.          Assessment & Plan:

## 2010-10-09 NOTE — Assessment & Plan Note (Addendum)
Continue with current prescription therapy as reflected on the Med list. Singulair.

## 2011-01-22 ENCOUNTER — Ambulatory Visit (INDEPENDENT_AMBULATORY_CARE_PROVIDER_SITE_OTHER): Payer: PRIVATE HEALTH INSURANCE | Admitting: Internal Medicine

## 2011-01-22 ENCOUNTER — Encounter: Payer: Self-pay | Admitting: Internal Medicine

## 2011-01-22 VITALS — BP 108/66 | HR 92 | Temp 99.7°F | Resp 16 | Wt 157.0 lb

## 2011-01-22 DIAGNOSIS — F988 Other specified behavioral and emotional disorders with onset usually occurring in childhood and adolescence: Secondary | ICD-10-CM

## 2011-01-22 DIAGNOSIS — Z0000 Encounter for general adult medical examination without abnormal findings: Secondary | ICD-10-CM

## 2011-01-22 MED ORDER — AMPHETAMINE-DEXTROAMPHETAMINE 20 MG PO TABS: 1.0000 | ORAL_TABLET | Freq: Two times a day (BID) | ORAL | Status: DC

## 2011-01-22 NOTE — Progress Notes (Signed)
  Subjective:    Patient ID: Heather Freeman, female    DOB: Sep 25, 1965, 46 y.o.   MRN: [id number]  HPI  F/u ADD  Review of Systems  Constitutional: Negative for chills and diaphoresis.  Eyes: Negative for visual disturbance.  Respiratory: Negative for cough.   Cardiovascular: Negative for chest pain, palpitations and leg swelling.  Psychiatric/Behavioral: Negative for behavioral problems and sleep disturbance. The patient is not nervous/anxious.        Objective:   Physical Exam  Constitutional: She appears well-developed. No distress.  HENT:  Head: Normocephalic.  Right Ear: External ear normal.  Left Ear: External ear normal.  Nose: Nose normal.  Mouth/Throat: Oropharynx is clear and moist.  Eyes: Conjunctivae are normal. Pupils are equal, round, and reactive to light. Right eye exhibits no discharge. Left eye exhibits no discharge.  Neck: Normal range of motion. Neck supple. No JVD present. No tracheal deviation present. No thyromegaly present.  Cardiovascular: Normal rate, regular rhythm and normal heart sounds.   Pulmonary/Chest: No stridor. No respiratory distress. She has no wheezes.  Abdominal: Soft. Bowel sounds are normal. She exhibits no distension and no mass. There is no tenderness. There is no rebound and no guarding.  Musculoskeletal: She exhibits no edema and no tenderness.  Lymphadenopathy:    She has no cervical adenopathy.  Neurological: She displays normal reflexes. No cranial nerve deficit. She exhibits normal muscle tone. Coordination normal.  Skin: No rash noted. No erythema.  Psychiatric: She has a normal mood and affect. Her behavior is normal. Judgment and thought content normal.          Assessment & Plan:

## 2011-01-22 NOTE — Assessment & Plan Note (Signed)
Continue with current prescription therapy as reflected on the Med list.  

## 2011-01-23 ENCOUNTER — Ambulatory Visit: Payer: PRIVATE HEALTH INSURANCE | Admitting: Internal Medicine

## 2011-04-23 ENCOUNTER — Other Ambulatory Visit (INDEPENDENT_AMBULATORY_CARE_PROVIDER_SITE_OTHER): Payer: PRIVATE HEALTH INSURANCE

## 2011-04-23 DIAGNOSIS — Z0000 Encounter for general adult medical examination without abnormal findings: Secondary | ICD-10-CM

## 2011-04-23 DIAGNOSIS — F988 Other specified behavioral and emotional disorders with onset usually occurring in childhood and adolescence: Secondary | ICD-10-CM

## 2011-04-23 LAB — COMPREHENSIVE METABOLIC PANEL
CO2: 27 mEq/L (ref 19–32)
Calcium: 8.8 mg/dL (ref 8.4–10.5)
Creatinine, Ser: 0.7 mg/dL (ref 0.4–1.2)
GFR: 101.69 mL/min (ref >60.00)
Glucose, Bld: 85 mg/dL (ref 70–99)
Total Bilirubin: 0.9 mg/dL (ref 0.3–1.2)
Total Protein: 7.2 g/dL (ref 6.0–8.3)

## 2011-04-23 LAB — URINALYSIS, ROUTINE W REFLEX MICROSCOPIC
Bilirubin Urine: NEGATIVE
Leukocytes, UA: NEGATIVE
Nitrite: NEGATIVE
Urobilinogen, UA: 0.2 (ref 0.0–1.0)
pH: 6 (ref 5.0–8.0)

## 2011-04-23 LAB — CBC WITH DIFFERENTIAL/PLATELET
Basophils Absolute: 0 10*3/uL (ref 0.0–0.1)
Eosinophils Absolute: 0 10*3/uL (ref 0.0–0.7)
Lymphocytes Relative: 30.4 % (ref 12.0–46.0)
MCHC: 33.8 g/dL (ref 30.0–36.0)
Neutrophils Relative %: 63.5 % (ref 43.0–77.0)
Platelets: 334 10*3/uL (ref 150.0–400.0)
RDW: 12.4 % (ref 11.5–14.6)

## 2011-04-23 LAB — LIPID PANEL
HDL: 90.7 mg/dL (ref >39.00)
Triglycerides: 26 mg/dL (ref 0.0–149.0)

## 2011-04-25 ENCOUNTER — Encounter: Payer: Self-pay | Admitting: Internal Medicine

## 2011-04-25 ENCOUNTER — Ambulatory Visit (INDEPENDENT_AMBULATORY_CARE_PROVIDER_SITE_OTHER): Payer: PRIVATE HEALTH INSURANCE | Admitting: Internal Medicine

## 2011-04-25 VITALS — BP 118/70 | HR 84 | Temp 98.3°F | Resp 16 | Wt 158.0 lb

## 2011-04-25 DIAGNOSIS — N946 Dysmenorrhea, unspecified: Secondary | ICD-10-CM

## 2011-04-25 MED ORDER — AMPHETAMINE-DEXTROAMPHETAMINE 20 MG PO TABS: 1.0000 | ORAL_TABLET | Freq: Two times a day (BID) | ORAL | Status: DC

## 2011-04-25 MED ORDER — FLUOXETINE HCL 20 MG PO CAPS: 20.0000 mg | ORAL_CAPSULE | Freq: Every day | ORAL | Status: DC

## 2011-04-25 MED ORDER — TRIAMCINOLONE ACETONIDE 0.5 % EX CREA: 1.0000 "application " | TOPICAL_CREAM | Freq: Two times a day (BID) | CUTANEOUS | Status: DC

## 2011-04-25 NOTE — Progress Notes (Signed)
Patient ID: Heather Freeman, female   DOB: [birth year], 46 y.o.   MRN: [id number]  Subjective:    Patient ID: Heather Freeman, female    DOB: 1966/01/05, 46 y.o.   MRN: [id number]  HPI  F/u ADD C/o itching and rash C/o hot flashes  BP Readings from Last 3 Encounters:  04/25/11 118/70  01/22/11 108/66  10/09/10 102/62   Wt Readings from Last 3 Encounters:  04/25/11 158 lb (71.668 kg)  01/22/11 157 lb (71.215 kg)  10/09/10 152 lb (68.947 kg)      Review of Systems  Constitutional: Negative for chills and diaphoresis.  Eyes: Negative for visual disturbance.  Respiratory: Negative for cough.   Cardiovascular: Negative for chest pain, palpitations and leg swelling.  Psychiatric/Behavioral: Negative for behavioral problems and sleep disturbance. The patient is not nervous/anxious.        Objective:   Physical Exam  Constitutional: She appears well-developed. No distress.  HENT:  Head: Normocephalic.  Right Ear: External ear normal.  Left Ear: External ear normal.  Nose: Nose normal.  Mouth/Throat: Oropharynx is clear and moist.  Eyes: Conjunctivae are normal. Pupils are equal, round, and reactive to light. Right eye exhibits no discharge. Left eye exhibits no discharge.  Neck: Normal range of motion. Neck supple. No JVD present. No tracheal deviation present. No thyromegaly present.  Cardiovascular: Normal rate, regular rhythm and normal heart sounds.   Pulmonary/Chest: No stridor. No respiratory distress. She has no wheezes.  Abdominal: Soft. Bowel sounds are normal. She exhibits no distension and no mass. There is no tenderness. There is no rebound and no guarding.  Musculoskeletal: She exhibits no edema and no tenderness.  Lymphadenopathy:    She has no cervical adenopathy.  Neurological: She displays normal reflexes. No cranial nerve deficit. She exhibits normal muscle tone. Coordination normal.  Skin: Rash (UE and LEs excoriations) noted. No erythema.  Psychiatric:  She has a normal mood and affect. Her behavior is normal. Judgment and thought content normal.  papules and excoriations on UE and LE B  Lab Results  Component Value Date   WBC 7.3 04/23/2011   HGB 12.8 04/23/2011   HCT 37.8 04/23/2011   PLT 334.0 04/23/2011   GLUCOSE 85 04/23/2011   CHOL 194 04/23/2011   TRIG 26.0 04/23/2011   HDL 90.70 04/23/2011   LDLDIRECT 89.9 10/19/2007   LDLCALC 98 04/23/2011   ALT 17 04/23/2011   AST 26 04/23/2011   NA 136 04/23/2011   K 3.5 04/23/2011   CL 100 04/23/2011   CREATININE 0.7 04/23/2011   BUN 10 04/23/2011   CO2 27 04/23/2011   TSH 0.95 04/23/2011         Assessment & Plan:

## 2011-08-01 ENCOUNTER — Ambulatory Visit (INDEPENDENT_AMBULATORY_CARE_PROVIDER_SITE_OTHER): Payer: PRIVATE HEALTH INSURANCE | Admitting: Internal Medicine

## 2011-08-01 ENCOUNTER — Encounter: Payer: Self-pay | Admitting: Internal Medicine

## 2011-08-01 VITALS — BP 108/70 | HR 84 | Temp 98.3°F | Resp 16 | Wt 151.0 lb

## 2011-08-01 DIAGNOSIS — N946 Dysmenorrhea, unspecified: Secondary | ICD-10-CM

## 2011-08-01 DIAGNOSIS — J309 Allergic rhinitis, unspecified: Secondary | ICD-10-CM

## 2011-08-01 DIAGNOSIS — F988 Other specified behavioral and emotional disorders with onset usually occurring in childhood and adolescence: Secondary | ICD-10-CM

## 2011-08-01 MED ORDER — AMPHETAMINE-DEXTROAMPHETAMINE 20 MG PO TABS: 1.0000 | ORAL_TABLET | Freq: Two times a day (BID) | ORAL | Status: DC

## 2011-08-01 MED ORDER — TRAMADOL HCL 50 MG PO TABS: 50.0000 mg | ORAL_TABLET | Freq: Two times a day (BID) | ORAL | Status: DC | PRN

## 2011-08-01 NOTE — Progress Notes (Signed)
   Subjective:    Patient ID: Heather Freeman, female    DOB: 07/24/1965, 46 y.o.   MRN: [id number]  HPI  F/u ADD, cramps   BP Readings from Last 3 Encounters:  08/01/11 108/70  04/25/11 118/70  01/22/11 108/66   Wt Readings from Last 3 Encounters:  08/01/11 151 lb (68.493 kg)  04/25/11 158 lb (71.668 kg)  01/22/11 157 lb (71.215 kg)      Review of Systems  Constitutional: Negative for chills and diaphoresis.  Eyes: Negative for visual disturbance.  Respiratory: Negative for cough.   Cardiovascular: Negative for chest pain, palpitations and leg swelling.  Psychiatric/Behavioral: Negative for behavioral problems and disturbed wake/sleep cycle. The patient is not nervous/anxious.        Objective:   Physical Exam  Constitutional: She appears well-developed. No distress.  HENT:  Head: Normocephalic.  Right Ear: External ear normal.  Left Ear: External ear normal.  Nose: Nose normal.  Mouth/Throat: Oropharynx is clear and moist.  Eyes: Conjunctivae are normal. Pupils are equal, round, and reactive to light. Right eye exhibits no discharge. Left eye exhibits no discharge.  Neck: Normal range of motion. Neck supple. No JVD present. No tracheal deviation present. No thyromegaly present.  Cardiovascular: Normal rate, regular rhythm and normal heart sounds.   Pulmonary/Chest: No stridor. No respiratory distress. She has no wheezes.  Abdominal: Soft. Bowel sounds are normal. She exhibits no distension and no mass. There is no tenderness. There is no rebound and no guarding.  Musculoskeletal: She exhibits no edema and no tenderness.  Lymphadenopathy:    She has no cervical adenopathy.  Neurological: She displays normal reflexes. No cranial nerve deficit. She exhibits normal muscle tone. Coordination normal.  Skin: Rash (UE and LEs excoriations) noted. No erythema.  Psychiatric: She has a normal mood and affect. Her behavior is normal. Judgment and thought content normal.     Lab Results  Component Value Date   WBC 7.3 04/23/2011   HGB 12.8 04/23/2011   HCT 37.8 04/23/2011   PLT 334.0 04/23/2011   GLUCOSE 85 04/23/2011   CHOL 194 04/23/2011   TRIG 26.0 04/23/2011   HDL 90.70 04/23/2011   LDLDIRECT 89.9 10/19/2007   LDLCALC 98 04/23/2011   ALT 17 04/23/2011   AST 26 04/23/2011   NA 136 04/23/2011   K 3.5 04/23/2011   CL 100 04/23/2011   CREATININE 0.7 04/23/2011   BUN 10 04/23/2011   CO2 27 04/23/2011   TSH 0.95 04/23/2011         Assessment & Plan:

## 2011-08-02 NOTE — Assessment & Plan Note (Signed)
Continue with current prescription therapy as reflected on the Med list.  

## 2011-11-12 ENCOUNTER — Ambulatory Visit (INDEPENDENT_AMBULATORY_CARE_PROVIDER_SITE_OTHER): Payer: PRIVATE HEALTH INSURANCE | Admitting: Internal Medicine

## 2011-11-12 ENCOUNTER — Encounter: Payer: Self-pay | Admitting: Internal Medicine

## 2011-11-12 VITALS — BP 92/60 | HR 80 | Temp 98.1°F | Resp 16 | Wt 152.0 lb

## 2011-11-12 DIAGNOSIS — N946 Dysmenorrhea, unspecified: Secondary | ICD-10-CM

## 2011-11-12 DIAGNOSIS — R21 Rash and other nonspecific skin eruption: Secondary | ICD-10-CM

## 2011-11-12 DIAGNOSIS — F988 Other specified behavioral and emotional disorders with onset usually occurring in childhood and adolescence: Secondary | ICD-10-CM

## 2011-11-12 DIAGNOSIS — L719 Rosacea, unspecified: Secondary | ICD-10-CM

## 2011-11-12 MED ORDER — MONTELUKAST SODIUM 10 MG PO TABS: 10.0000 mg | ORAL_TABLET | Freq: Every day | ORAL | Status: DC

## 2011-11-12 MED ORDER — AMPHETAMINE-DEXTROAMPHETAMINE 20 MG PO TABS: 1.0000 | ORAL_TABLET | Freq: Two times a day (BID) | ORAL | Status: DC

## 2011-11-12 MED ORDER — TRIAMCINOLONE ACETONIDE 0.5 % EX CREA: 1.0000 "application " | TOPICAL_CREAM | Freq: Three times a day (TID) | CUTANEOUS | Status: DC

## 2011-11-12 NOTE — Assessment & Plan Note (Signed)
L eye eyelids 10/13 Triamc cream bid

## 2011-11-12 NOTE — Assessment & Plan Note (Signed)
Continue with current prescription therapy as reflected on the Med list.  

## 2011-11-23 ENCOUNTER — Encounter: Payer: Self-pay | Admitting: Internal Medicine

## 2011-11-23 NOTE — Progress Notes (Signed)
   Subjective:    Patient ID: Heather Freeman, female    DOB: September 22, 1965, 46 y.o.   MRN: [id number]  HPI  F/u ADD F/u cramps F/u hot flashes  BP Readings from Last 3 Encounters:  11/12/11 92/60  08/01/11 108/70  04/25/11 118/70   Wt Readings from Last 3 Encounters:  11/12/11 152 lb (68.947 kg)  08/01/11 151 lb (68.493 kg)  04/25/11 158 lb (71.668 kg)      Review of Systems  Constitutional: Negative for chills and diaphoresis.  Eyes: Negative for visual disturbance.  Respiratory: Negative for cough.   Cardiovascular: Negative for chest pain, palpitations and leg swelling.  Psychiatric/Behavioral: Negative for behavioral problems and sleep disturbance. The patient is not nervous/anxious.        Objective:   Physical Exam  Constitutional: She appears well-developed. No distress.  HENT:  Head: Normocephalic.  Right Ear: External ear normal.  Left Ear: External ear normal.  Nose: Nose normal.  Mouth/Throat: Oropharynx is clear and moist.  Eyes: Conjunctivae normal are normal. Pupils are equal, round, and reactive to light. Right eye exhibits no discharge. Left eye exhibits no discharge.  Neck: Normal range of motion. Neck supple. No JVD present. No tracheal deviation present. No thyromegaly present.  Cardiovascular: Normal rate, regular rhythm and normal heart sounds.   Pulmonary/Chest: No stridor. No respiratory distress. She has no wheezes.  Abdominal: Soft. Bowel sounds are normal. She exhibits no distension and no mass. There is no tenderness. There is no rebound and no guarding.  Musculoskeletal: She exhibits no edema and no tenderness.  Lymphadenopathy:    She has no cervical adenopathy.  Neurological: She displays normal reflexes. No cranial nerve deficit. She exhibits normal muscle tone. Coordination normal.  Skin: No rash noted. No erythema.  Psychiatric: She has a normal mood and affect. Her behavior is normal. Judgment and thought content normal.    Lab  Results  Component Value Date   WBC 7.3 04/23/2011   HGB 12.8 04/23/2011   HCT 37.8 04/23/2011   PLT 334.0 04/23/2011   GLUCOSE 85 04/23/2011   CHOL 194 04/23/2011   TRIG 26.0 04/23/2011   HDL 90.70 04/23/2011   LDLDIRECT 89.9 10/19/2007   LDLCALC 98 04/23/2011   ALT 17 04/23/2011   AST 26 04/23/2011   NA 136 04/23/2011   K 3.5 04/23/2011   CL 100 04/23/2011   CREATININE 0.7 04/23/2011   BUN 10 04/23/2011   CO2 27 04/23/2011   TSH 0.95 04/23/2011         Assessment & Plan:

## 2012-02-18 ENCOUNTER — Encounter: Payer: Self-pay | Admitting: Internal Medicine

## 2012-02-18 ENCOUNTER — Ambulatory Visit (INDEPENDENT_AMBULATORY_CARE_PROVIDER_SITE_OTHER): Payer: BC Managed Care – PPO | Admitting: Internal Medicine

## 2012-02-18 VITALS — BP 130/86 | HR 80 | Temp 97.2°F | Resp 16 | Wt 155.0 lb

## 2012-02-18 DIAGNOSIS — F988 Other specified behavioral and emotional disorders with onset usually occurring in childhood and adolescence: Secondary | ICD-10-CM

## 2012-02-18 MED ORDER — AMPHETAMINE-DEXTROAMPHETAMINE 20 MG PO TABS: 1.0000 | ORAL_TABLET | Freq: Two times a day (BID) | ORAL | Status: DC

## 2012-02-18 NOTE — Progress Notes (Signed)
   Subjective:    HPI  F/u ADD F/u cramps F/u hot flashes  BP Readings from Last 3 Encounters:  02/18/12 130/86  11/12/11 92/60  08/01/11 108/70   Wt Readings from Last 3 Encounters:  02/18/12 155 lb (70.308 kg)  11/12/11 152 lb (68.947 kg)  08/01/11 151 lb (68.493 kg)      Review of Systems  Constitutional: Negative for chills and diaphoresis.  Eyes: Negative for visual disturbance.  Respiratory: Negative for cough.   Cardiovascular: Negative for chest pain, palpitations and leg swelling.  Psychiatric/Behavioral: Negative for behavioral problems and sleep disturbance. The patient is not nervous/anxious.        Objective:   Physical Exam  Constitutional: She appears well-developed. No distress.  HENT:  Head: Normocephalic.  Right Ear: External ear normal.  Left Ear: External ear normal.  Nose: Nose normal.  Mouth/Throat: Oropharynx is clear and moist.  Eyes: Conjunctivae normal are normal. Pupils are equal, round, and reactive to light. Right eye exhibits no discharge. Left eye exhibits no discharge.  Neck: Normal range of motion. Neck supple. No JVD present. No tracheal deviation present. No thyromegaly present.  Cardiovascular: Normal rate, regular rhythm and normal heart sounds.   Pulmonary/Chest: No stridor. No respiratory distress. She has no wheezes.  Abdominal: Soft. Bowel sounds are normal. She exhibits no distension and no mass. There is no tenderness. There is no rebound and no guarding.  Musculoskeletal: She exhibits no edema and no tenderness.  Lymphadenopathy:    She has no cervical adenopathy.  Neurological: She displays normal reflexes. No cranial nerve deficit. She exhibits normal muscle tone. Coordination normal.  Skin: No rash noted. No erythema.  Psychiatric: She has a normal mood and affect. Her behavior is normal. Judgment and thought content normal.    Lab Results  Component Value Date   WBC 7.3 04/23/2011   HGB 12.8 04/23/2011   HCT 37.8  04/23/2011   PLT 334.0 04/23/2011   GLUCOSE 85 04/23/2011   CHOL 194 04/23/2011   TRIG 26.0 04/23/2011   HDL 90.70 04/23/2011   LDLDIRECT 89.9 10/19/2007   LDLCALC 98 04/23/2011   ALT 17 04/23/2011   AST 26 04/23/2011   NA 136 04/23/2011   K 3.5 04/23/2011   CL 100 04/23/2011   CREATININE 0.7 04/23/2011   BUN 10 04/23/2011   CO2 27 04/23/2011   TSH 0.95 04/23/2011         Assessment & Plan:

## 2012-02-18 NOTE — Assessment & Plan Note (Signed)
Potential benefits of a long term amphetamines  use as well as potential risks  and complications were explained to the patient and were aknowledged. Continue with current prescription therapy as reflected on the Med list.  

## 2012-05-07 ENCOUNTER — Ambulatory Visit (INDEPENDENT_AMBULATORY_CARE_PROVIDER_SITE_OTHER): Payer: BC Managed Care – PPO | Admitting: Internal Medicine

## 2012-05-07 ENCOUNTER — Encounter: Payer: Self-pay | Admitting: Internal Medicine

## 2012-05-07 VITALS — BP 118/86 | HR 80 | Temp 98.6°F | Resp 16 | Wt 154.0 lb

## 2012-05-07 DIAGNOSIS — J309 Allergic rhinitis, unspecified: Secondary | ICD-10-CM

## 2012-05-07 DIAGNOSIS — F988 Other specified behavioral and emotional disorders with onset usually occurring in childhood and adolescence: Secondary | ICD-10-CM

## 2012-05-07 DIAGNOSIS — N946 Dysmenorrhea, unspecified: Secondary | ICD-10-CM

## 2012-05-07 MED ORDER — AMPHETAMINE-DEXTROAMPHETAMINE 20 MG PO TABS: 1.0000 | ORAL_TABLET | Freq: Two times a day (BID) | ORAL | Status: DC

## 2012-05-07 MED ORDER — FLUOXETINE HCL 20 MG PO CAPS: 20.0000 mg | ORAL_CAPSULE | Freq: Every day | ORAL | Status: DC

## 2012-05-07 NOTE — Assessment & Plan Note (Signed)
Continue with current prescription therapy as reflected on the Med list.  

## 2012-05-07 NOTE — Progress Notes (Signed)
   Subjective:    HPI  F/u ADD F/u cramps F/u hot flashes  BP Readings from Last 3 Encounters:  05/07/12 118/86  02/18/12 130/86  11/12/11 92/60   Wt Readings from Last 3 Encounters:  05/07/12 154 lb (69.854 kg)  02/18/12 155 lb (70.308 kg)  11/12/11 152 lb (68.947 kg)      Review of Systems  Constitutional: Negative for chills and diaphoresis.  Eyes: Negative for visual disturbance.  Respiratory: Negative for cough.   Cardiovascular: Negative for chest pain, palpitations and leg swelling.  Psychiatric/Behavioral: Negative for behavioral problems and sleep disturbance. The patient is not nervous/anxious.        Objective:   Physical Exam  Constitutional: She appears well-developed. No distress.  HENT:  Head: Normocephalic.  Right Ear: External ear normal.  Left Ear: External ear normal.  Nose: Nose normal.  Mouth/Throat: Oropharynx is clear and moist.  Eyes: Conjunctivae are normal. Pupils are equal, round, and reactive to light. Right eye exhibits no discharge. Left eye exhibits no discharge.  Neck: Normal range of motion. Neck supple. No JVD present. No tracheal deviation present. No thyromegaly present.  Cardiovascular: Normal rate, regular rhythm and normal heart sounds.   Pulmonary/Chest: No stridor. No respiratory distress. She has no wheezes.  Abdominal: Soft. Bowel sounds are normal. She exhibits no distension and no mass. There is no tenderness. There is no rebound and no guarding.  Musculoskeletal: She exhibits no edema and no tenderness.  Lymphadenopathy:    She has no cervical adenopathy.  Neurological: She displays normal reflexes. No cranial nerve deficit. She exhibits normal muscle tone. Coordination normal.  Skin: No rash noted. No erythema.  Psychiatric: She has a normal mood and affect. Her behavior is normal. Judgment and thought content normal.    Lab Results  Component Value Date   WBC 7.3 04/23/2011   HGB 12.8 04/23/2011   HCT 37.8  04/23/2011   PLT 334.0 04/23/2011   GLUCOSE 85 04/23/2011   CHOL 194 04/23/2011   TRIG 26.0 04/23/2011   HDL 90.70 04/23/2011   LDLDIRECT 89.9 10/19/2007   LDLCALC 98 04/23/2011   ALT 17 04/23/2011   AST 26 04/23/2011   NA 136 04/23/2011   K 3.5 04/23/2011   CL 100 04/23/2011   CREATININE 0.7 04/23/2011   BUN 10 04/23/2011   CO2 27 04/23/2011   TSH 0.95 04/23/2011         Assessment & Plan:

## 2012-05-18 ENCOUNTER — Ambulatory Visit: Payer: BC Managed Care – PPO | Admitting: Internal Medicine

## 2012-07-23 ENCOUNTER — Other Ambulatory Visit: Payer: Self-pay

## 2012-07-23 DIAGNOSIS — Z1231 Encounter for screening mammogram for malignant neoplasm of breast: Secondary | ICD-10-CM

## 2012-08-06 ENCOUNTER — Ambulatory Visit (INDEPENDENT_AMBULATORY_CARE_PROVIDER_SITE_OTHER): Payer: BC Managed Care – PPO | Admitting: Internal Medicine

## 2012-08-06 ENCOUNTER — Encounter: Payer: Self-pay | Admitting: Internal Medicine

## 2012-08-06 VITALS — BP 114/80 | HR 80 | Temp 98.0°F | Resp 16 | Wt 154.0 lb

## 2012-08-06 DIAGNOSIS — N946 Dysmenorrhea, unspecified: Secondary | ICD-10-CM

## 2012-08-06 DIAGNOSIS — Z0000 Encounter for general adult medical examination without abnormal findings: Secondary | ICD-10-CM

## 2012-08-06 DIAGNOSIS — F988 Other specified behavioral and emotional disorders with onset usually occurring in childhood and adolescence: Secondary | ICD-10-CM

## 2012-08-06 MED ORDER — AMPHETAMINE-DEXTROAMPHETAMINE 20 MG PO TABS: 1.0000 | ORAL_TABLET | Freq: Two times a day (BID) | ORAL | Status: DC

## 2012-08-06 NOTE — Progress Notes (Signed)
   Subjective:    HPI  F/u ADD F/u cramps F/u hot flashes  BP Readings from Last 3 Encounters:  08/06/12 114/80  05/07/12 118/86  02/18/12 130/86   Wt Readings from Last 3 Encounters:  08/06/12 154 lb (69.854 kg)  05/07/12 154 lb (69.854 kg)  02/18/12 155 lb (70.308 kg)      Review of Systems  Constitutional: Negative for chills and diaphoresis.  Eyes: Negative for visual disturbance.  Respiratory: Negative for cough.   Cardiovascular: Negative for chest pain, palpitations and leg swelling.  Psychiatric/Behavioral: Negative for behavioral problems and sleep disturbance. The patient is not nervous/anxious.        Objective:   Physical Exam  Constitutional: She appears well-developed. No distress.  HENT:  Head: Normocephalic.  Right Ear: External ear normal.  Left Ear: External ear normal.  Nose: Nose normal.  Mouth/Throat: Oropharynx is clear and moist.  Eyes: Conjunctivae are normal. Pupils are equal, round, and reactive to light. Right eye exhibits no discharge. Left eye exhibits no discharge.  Neck: Normal range of motion. Neck supple. No JVD present. No tracheal deviation present. No thyromegaly present.  Cardiovascular: Normal rate, regular rhythm and normal heart sounds.   Pulmonary/Chest: No stridor. No respiratory distress. She has no wheezes.  Abdominal: Soft. Bowel sounds are normal. She exhibits no distension and no mass. There is no tenderness. There is no rebound and no guarding.  Musculoskeletal: She exhibits no edema and no tenderness.  Lymphadenopathy:    She has no cervical adenopathy.  Neurological: She displays normal reflexes. No cranial nerve deficit. She exhibits normal muscle tone. Coordination normal.  Skin: No rash noted. No erythema.  Psychiatric: She has a normal mood and affect. Her behavior is normal. Judgment and thought content normal.    Lab Results  Component Value Date   WBC 7.3 04/23/2011   HGB 12.8 04/23/2011   HCT 37.8  04/23/2011   PLT 334.0 04/23/2011   GLUCOSE 85 04/23/2011   CHOL 194 04/23/2011   TRIG 26.0 04/23/2011   HDL 90.70 04/23/2011   LDLDIRECT 89.9 10/19/2007   LDLCALC 98 04/23/2011   ALT 17 04/23/2011   AST 26 04/23/2011   NA 136 04/23/2011   K 3.5 04/23/2011   CL 100 04/23/2011   CREATININE 0.7 04/23/2011   BUN 10 04/23/2011   CO2 27 04/23/2011   TSH 0.95 04/23/2011         Assessment & Plan:

## 2012-08-06 NOTE — Assessment & Plan Note (Signed)
See meds Labs w/gyn

## 2012-08-08 NOTE — Assessment & Plan Note (Signed)
Continue with current prescription therapy as reflected on the Med list.  

## 2012-08-12 ENCOUNTER — Ambulatory Visit
Admission: RE | Admit: 2012-08-12 | Discharge: 2012-08-12 | Disposition: A | Discharge status: Home / Self Care | Payer: BC Managed Care – PPO | Source: Ambulatory Visit

## 2012-08-12 DIAGNOSIS — Z1231 Encounter for screening mammogram for malignant neoplasm of breast: Secondary | ICD-10-CM

## 2012-08-16 ENCOUNTER — Other Ambulatory Visit (HOSPITAL_COMMUNITY)
Admission: RE | Admit: 2012-08-16 | Discharge: 2012-08-16 | Disposition: A | Discharge status: Home / Self Care | Payer: BC Managed Care – PPO | Source: Ambulatory Visit | Attending: Gynecology | Admitting: Gynecology

## 2012-08-16 ENCOUNTER — Encounter: Payer: Self-pay | Admitting: Gynecology

## 2012-08-16 ENCOUNTER — Ambulatory Visit (INDEPENDENT_AMBULATORY_CARE_PROVIDER_SITE_OTHER): Payer: BC Managed Care – PPO | Admitting: Gynecology

## 2012-08-16 VITALS — BP 124/78 | Ht 64.75 in | Wt 150.0 lb

## 2012-08-16 DIAGNOSIS — Z01419 Encounter for gynecological examination (general) (routine) without abnormal findings: Secondary | ICD-10-CM

## 2012-08-16 DIAGNOSIS — N951 Menopausal and female climacteric states: Secondary | ICD-10-CM

## 2012-08-16 DIAGNOSIS — Z1151 Encounter for screening for human papillomavirus (HPV): Secondary | ICD-10-CM | POA: Insufficient documentation

## 2012-08-16 DIAGNOSIS — Z1159 Encounter for screening for other viral diseases: Secondary | ICD-10-CM

## 2012-08-16 NOTE — Addendum Note (Signed)
Addended by: Bertram Savin A on: 08/16/2012 03:42 PM   Modules accepted: Orders

## 2012-08-16 NOTE — Patient Instructions (Signed)
Hormone Therapy At menopause, your body begins making less estrogen and progesterone hormones. This causes the body to stop having menstrual periods. This is because estrogen and progesterone hormones control your periods and menstrual cycle. A lack of estrogen may cause symptoms such as:  Hot flushes (or hot flashes).  Vaginal dryness.  Dry skin.  Loss of sex drive.  Risk of bone loss (osteoporosis). When this happens, you may choose to take hormone therapy to get back the estrogen lost during menopause. When the hormone estrogen is given alone, it is usually referred to as ET (Estrogen Therapy). When the hormone progestin is combined with estrogen, it is generally called HT (Hormone Therapy). This was formerly known as hormone replacement therapy (HRT). Your caregiver can help you make a decision on what will be best for you. The decision to use HT seems to change often as new studies are done. Many studies do not agree on the benefits of hormone replacement therapy. LIKELY BENEFITS OF HT INCLUDE PROTECTION FROM:  Hot Flushes (also called hot flashes) - A hot flush is a sudden feeling of heat that spreads over the face and body. The skin may redden like a blush. It is connected with sweats and sleep disturbance. Women going through menopause may have hot flushes a few times a month or several times per day depending on the woman.  Osteoporosis (bone loss)- Estrogen helps guard against bone loss. After menopause, a woman's bones slowly lose calcium and become weak and brittle. As a result, bones are more likely to break. The hip, wrist, and spine are affected most often. Hormone therapy can help slow bone loss after menopause. Weight bearing exercise and taking calcium with vitamin D also can help prevent bone loss. There are also medications that your caregiver can prescribe that can help prevent osteoporosis.  Vaginal Dryness - Loss of estrogen causes changes in the vagina. Its lining may  become thin and dry. These changes can cause pain and bleeding during sexual intercourse. Dryness can also lead to infections. This can cause burning and itching. (Vaginal estrogen treatment can help relieve pain, itching, and dryness.)  Urinary Tract Infections are more common after menopause because of lack of estrogen. Some women also develop urinary incontinence because of low estrogen levels in the vagina and bladder.  Possible other benefits of estrogen include a positive effect on mood and short-term memory in women. RISKS AND COMPLICATIONS  Using estrogen alone without progesterone causes the lining of the uterus to grow. This increases the risk of lining of the uterus (endometrial) cancer. Your caregiver should give another hormone called progestin if you have a uterus.  Women who take combined (estrogen and progestin) HT appear to have an increased risk of breast cancer. The risk appears to be small, but increases throughout the time that HT is taken.  Combined therapy also makes the breast tissue slightly denser which makes it harder to read mammograms (breast X-rays).  Combined, estrogen and progesterone therapy can be taken together every day, in which case there may be spotting of blood. HT therapy can be taken cyclically in which case you will have menstrual periods. Cyclically means HT is taken for a set amount of days, then not taken, then this process is repeated.  HT may increase the risk of stroke, heart attack, breast cancer and forming blood clots in your leg.  Transdermal estrogen (estrogen that is absorbed through the skin with a patch or a cream) may have more positive results with:    Cholesterol.  Blood pressure.  Blood clots. Having the following conditions may indicate you should not have HT:  Endometrial cancer.  Liver disease.  Breast cancer.  Heart disease.  History of blood clots.  Stroke. TREATMENT   If you choose to take HT and have a uterus,  usually estrogen and progestin are prescribed.  Your caregiver will help you decide the best way to take the medications.  Possible ways to take estrogen include:  Pills.  Patches.  Gels.  Sprays.  Vaginal estrogen cream, rings and tablets.  It is best to take the lowest dose possible that will help your symptoms and take them for the shortest period of time that you can.  Hormone therapy can help relieve some of the problems (symptoms) that affect women at menopause. Before making a decision about HT, talk to your caregiver about what is best for you. Be well informed and comfortable with your decisions. HOME CARE INSTRUCTIONS   Follow your caregivers advice when taking the medications.  A Pap test is done to screen for cervical cancer.  The first Pap test should be done at age 21.  Between ages 21 and 29, Pap tests are repeated every 2 years.  Beginning at age 30, you are advised to have a Pap test every 3 years as long as your past 3 Pap tests have been normal.  Some women have medical problems that increase the chance of getting cervical cancer. Talk to your caregiver about these problems. It is especially important to talk to your caregiver if a new problem develops soon after your last Pap test. In these cases, your caregiver may recommend more frequent screening and Pap tests.  The above recommendations are the same for women who have or have not gotten the vaccine for HPV (Human Papillomavirus).  If you had a hysterectomy for a problem that was not a cancer or a condition that could lead to cancer, then you no longer need Pap tests. However, even if you no longer need a Pap test, a regular exam is a good idea to make sure no other problems are starting.   If you are between ages 65 and 70, and you have had normal Pap tests going back 10 years, you no longer need Pap tests. However, even if you no longer need a Pap test, a regular exam is a good idea to make sure no  other problems are starting.   If you have had past treatment for cervical cancer or a condition that could lead to cancer, you need Pap tests and screening for cancer for at least 20 years after your treatment.  If Pap tests have been discontinued, risk factors (such as a new sexual partner) need to be re-assessed to determine if screening should be resumed.  Some women may need screenings more often if they are at high risk for cervical cancer.  Get mammograms done as per the advice of your caregiver. SEEK IMMEDIATE MEDICAL CARE IF:  You develop abnormal vaginal bleeding.  You have pain or swelling in your legs, shortness of breath, or chest pain.  You develop dizziness or headaches.  You have lumps or changes in your breasts or armpits.  You have slurred speech.  You develop weakness or numbness of your arms or legs.  You have pain, burning, or bleeding when urinating.  You develop abdominal pain. Document Released: 09/28/2002 Document Revised: 03/24/2011 Document Reviewed: 01/16/2010 Exit[employer] Patient Information 2014 [employer]. Perimenopause Perimenopause is the time when   your body begins to move into the menopause (no menstrual period for 12 straight months). It is a natural process. Perimenopause can begin 2 to 8 years before the menopause and usually lasts for one year after the menopause. During this time, your ovaries may or may not produce an egg. The ovaries vary in their production of estrogen and progesterone hormones each month. This can cause irregular menstrual periods, difficulty in getting pregnant, vaginal bleeding between periods and uncomfortable symptoms. CAUSES  Irregular production of the ovarian hormones, estrogen and progesterone, and not ovulating every month.  Other causes include:  Tumor of the pituitary gland in the brain.  Medical disease that affects the ovaries.  Radiation treatment.  Chemotherapy.  Unknown causes.  Heavy  smoking and excessive alcohol intake can bring on perimenopause sooner. SYMPTOMS   Hot flashes.  Night sweats.  Irregular menstrual periods.  Decrease sex drive.  Vaginal dryness.  Headaches.  Mood swings.  Depression.  Memory problems.  Irritability.  Tiredness.  Weight gain.  Trouble getting pregnant.  The beginning of losing bone cells (osteoporosis).  The beginning of hardening of the arteries (atherosclerosis). DIAGNOSIS  Your caregiver will make a diagnosis by analyzing your age, menstrual history and your symptoms. They will do a physical exam noting any changes in your body, especially your female organs. Female hormone tests may or may not be helpful depending on the amount and when you produce the female hormones. However, other hormone tests may be helpful (ex. thyroid hormone) to rule out other problems. TREATMENT  The decision to treat during the perimenopause should be made by you and your caregiver depending on how the symptoms are affecting you and your life style. There are various treatments available such as:  Treating individual symptoms with a specific medication for that symptom (ex. tranquilizer for depression).  Herbal medications that can help specific symptoms.  Counseling.  Group therapy.  No treatment. HOME CARE INSTRUCTIONS   Before seeing your caregiver, make a list of your menstrual periods (when the occur, how heavy they are, how long between periods and how long they last), your symptoms and when they started.  Take the medication as recommended by your caregiver.  Sleep and rest.  Exercise.  Eat a diet that contains calcium (good for your bones) and soy (acts like estrogen hormone).  Do not smoke.  Avoid alcoholic beverages.  Taking vitamin E may help in certain cases.  Take calcium and vitamin D supplements to help prevent bone loss.  Group therapy is sometimes helpful.  Acupuncture may help in some cases. SEEK  MEDICAL CARE IF:   You have any of the above and want to know if it is perimenopause.  You want advice and treatment for any of your symptoms mentioned above.  You need a referral to a specialist (gynecologist, psychiatrist or psychologist). SEEK IMMEDIATE MEDICAL CARE IF:   You have vaginal bleeding.  Your period lasts longer than 8 days.  You periods are recurring sooner than 21 days.  You have bleeding after intercourse.  You have severe depression.  You have pain when you urinate.  You have severe headaches.  You develop vision problems. Document Released: 02/07/2004 Document Revised: 03/24/2011 Document Reviewed: 10/28/2007 Exit[employer] Patient Information 2014 [employer].  

## 2012-08-16 NOTE — Progress Notes (Signed)
Heather Freeman [birth year] [id number]   History:    47 y.o.  for annual gyn exam who has not been seen in the office was 2011. Patient had questions in reference to the menopause. Her primary physician Dr. Posey Rea had started her on Prozac for PMDD in the past which she takes 2 weeks on and 2 weeks off which has helped some of her irritability during the time of her cycle. Patient states that occasionally she has skipped menses. She denies any nipple discharge or any visual disturbances or headaches. Her PCP has drawn her lab work and she scheduled her for labs October this year. 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 She also had endometrial ablation via her option technique in 2011. She had a normal bone density study in 2006. Patient also has had a previous tubal sterilization procedure.   Past medical history,surgical history, family history and social history were all reviewed and documented in the EPIC chart.  Gynecologic History Patient's last menstrual period was 06/13/2012. Contraception: tubal ligation Last Pap: 2011 low-grade squamous intraepithelial lesion. Results were: low-grade squamous intraepithelial lesion Last mammogram: 2014. Results were: normal but dense  Obstetric History OB History   Grav Para Term Preterm Abortions TAB SAB Ect Mult Living   2 2        2      # Outc Date GA Lbr Len/2nd Wgt Sex Del Anes PTL Lv   1 PAR            2 PAR                ROS: A ROS was performed and pertinent positives and negatives are included in the history.  GENERAL: No fevers or chills. HEENT: No change in vision, no earache, sore throat or sinus congestion. NECK: No pain or stiffness. CARDIOVASCULAR: No chest pain or pressure. No palpitations. PULMONARY: No shortness of breath, cough or wheeze. GASTROINTESTINAL: No abdominal pain, nausea, vomiting or diarrhea, melena or bright red  blood per rectum. GENITOURINARY: No urinary frequency, urgency, hesitancy or dysuria. MUSCULOSKELETAL: No joint or muscle pain, no back pain, no recent trauma. DERMATOLOGIC: No rash, no itching, no lesions. ENDOCRINE: No polyuria, polydipsia, no heat or cold intolerance. No recent change in weight. HEMATOLOGICAL: No anemia or easy bruising or bleeding. NEUROLOGIC: No headache, seizures, numbness, tingling or weakness. PSYCHIATRIC: No depression, no loss of interest in normal activity or change in sleep pattern.     Exam: chaperone present  BP 124/78  Ht 5' 4.75" (1.645 m)  Wt 150 lb (68.04 kg)  BMI 25.14 kg/m2  LMP 06/13/2012  Body mass index is 25.14 kg/(m^2).  General appearance : Well developed well nourished female. No acute distress HEENT: Neck supple, trachea midline, no carotid bruits, no thyroidmegaly Lungs: Clear to auscultation, no rhonchi or wheezes, or rib retractions  Heart: Regular rate and rhythm, no murmurs or gallops Breast:Examined in sitting and supine position were symmetrical in appearance, no palpable masses or tenderness,  no skin retraction, no nipple inversion, no nipple discharge, no skin discoloration, no axillary or supraclavicular lymphadenopathy Abdomen: no palpable masses or tenderness, no rebound or guarding Extremities: no edema or skin discoloration or tenderness  Pelvic:  Bartholin, Urethra, Skene Glands: Within normal limits             Vagina: No gross lesions or discharge  Cervix: No gross lesions or discharge  Uterus  anteverted, normal size,  shape and consistency, non-tender and mobile  Adnexa  Without masses or tenderness  Anus and perineum  normal   Rectovaginal  normal sphincter tone without palpated masses or tenderness             Hemoccult that indicated     Assessment/Plan:  47 y.o. female for annual exam we will have her blood drawn by her primary physician in October this year. But due to her perimenopausal like symptoms we are going  to check her [facility] today.   New CDC guidelines is recommending patients be tested once in her lifetime for hepatitis C antibody who were born between [birth year] through 1967. This was discussed with the patient today and has agreed to be tested today.  Urinalysis and Pap smear was done today. The new guidelines were discussed as well. Patient was reminded to do her monthly breast exam. She will need colonoscopy next year. Literature formation of the perimenopause and menopause as well as hormone replacement therapy was provided. Patient's Tdap vaccine as of today.    Ok Edwards MD, 3:34 PM 08/16/2012

## 2012-08-17 ENCOUNTER — Encounter: Payer: Self-pay | Admitting: Obstetrics and Gynecology

## 2012-08-17 ENCOUNTER — Other Ambulatory Visit: Payer: Self-pay | Admitting: Gynecology

## 2012-08-17 DIAGNOSIS — R3129 Other microscopic hematuria: Secondary | ICD-10-CM

## 2012-08-17 LAB — URINALYSIS W MICROSCOPIC + REFLEX CULTURE
Glucose, UA: NEGATIVE mg/dL
Leukocytes, UA: NEGATIVE
Nitrite: NEGATIVE
Protein, ur: NEGATIVE mg/dL
Urobilinogen, UA: 0.2 mg/dL (ref 0.0–1.0)

## 2012-08-17 LAB — HEPATITIS C ANTIBODY: HCV Ab: NEGATIVE

## 2012-08-17 LAB — FOLLICLE STIMULATING HORMONE: FSH: 27.6 m[IU]/mL

## 2012-11-05 ENCOUNTER — Encounter: Payer: BC Managed Care – PPO | Admitting: Internal Medicine

## 2012-11-09 ENCOUNTER — Other Ambulatory Visit (INDEPENDENT_AMBULATORY_CARE_PROVIDER_SITE_OTHER): Payer: BC Managed Care – PPO

## 2012-11-09 DIAGNOSIS — Z0000 Encounter for general adult medical examination without abnormal findings: Secondary | ICD-10-CM

## 2012-11-09 LAB — URINALYSIS
Bilirubin Urine: NEGATIVE
Leukocytes, UA: NEGATIVE
Nitrite: NEGATIVE
Specific Gravity, Urine: <=1.005 (ref 1.000–1.030)
Urobilinogen, UA: 0.2 (ref 0.0–1.0)
pH: 7.5 (ref 5.0–8.0)

## 2012-11-09 LAB — HEPATIC FUNCTION PANEL
ALT: 16 U/L (ref 0–35)
AST: 26 U/L (ref 0–37)
Albumin: 4.4 g/dL (ref 3.5–5.2)
Alkaline Phosphatase: 40 U/L (ref 39–117)
Bilirubin, Direct: 0.1 mg/dL (ref 0.0–0.3)
Total Bilirubin: 1 mg/dL (ref 0.3–1.2)
Total Protein: 7.7 g/dL (ref 6.0–8.3)

## 2012-11-09 LAB — BASIC METABOLIC PANEL
BUN: 11 mg/dL (ref 6–23)
CO2: 29 mEq/L (ref 19–32)
Calcium: 9.1 mg/dL (ref 8.4–10.5)
Chloride: 100 mEq/L (ref 96–112)
Creatinine, Ser: 0.7 mg/dL (ref 0.4–1.2)
GFR: 102.83 mL/min (ref >60.00)
Glucose, Bld: 99 mg/dL (ref 70–99)
Potassium: 3.7 mEq/L (ref 3.5–5.1)
Sodium: 136 mEq/L (ref 135–145)

## 2012-11-09 LAB — CBC WITH DIFFERENTIAL/PLATELET
Basophils Absolute: 0 10*3/uL (ref 0.0–0.1)
Eosinophils Absolute: 0.1 10*3/uL (ref 0.0–0.7)
Hemoglobin: 13.7 g/dL (ref 12.0–15.0)
Lymphocytes Relative: 31.4 % (ref 12.0–46.0)
MCHC: 34.7 g/dL (ref 30.0–36.0)
Neutro Abs: 4.7 10*3/uL (ref 1.4–7.7)
Neutrophils Relative %: 62.6 % (ref 43.0–77.0)
RDW: 12.1 % (ref 11.5–14.6)

## 2012-11-09 LAB — LIPID PANEL
Cholesterol: 210 mg/dL — ABNORMAL HIGH (ref 0–200)
Total CHOL/HDL Ratio: 2
Triglycerides: 38 mg/dL (ref 0.0–149.0)

## 2012-11-09 LAB — TSH: TSH: 1.73 u[IU]/mL (ref 0.35–5.50)

## 2012-11-09 LAB — LDL CHOLESTEROL, DIRECT: Direct LDL: 101.3 mg/dL

## 2012-11-15 ENCOUNTER — Encounter: Payer: Self-pay | Admitting: Internal Medicine

## 2012-11-15 ENCOUNTER — Ambulatory Visit (INDEPENDENT_AMBULATORY_CARE_PROVIDER_SITE_OTHER): Payer: BC Managed Care – PPO | Admitting: Internal Medicine

## 2012-11-15 VITALS — BP 114/78 | HR 80 | Temp 97.8°F | Resp 16 | Wt 154.0 lb

## 2012-11-15 DIAGNOSIS — N946 Dysmenorrhea, unspecified: Secondary | ICD-10-CM

## 2012-11-15 DIAGNOSIS — Z23 Encounter for immunization: Secondary | ICD-10-CM

## 2012-11-15 DIAGNOSIS — F988 Other specified behavioral and emotional disorders with onset usually occurring in childhood and adolescence: Secondary | ICD-10-CM

## 2012-11-15 MED ORDER — AMPHETAMINE-DEXTROAMPHETAMINE 20 MG PO TABS: 1.0000 | ORAL_TABLET | Freq: Two times a day (BID) | ORAL | Status: DC

## 2012-11-15 NOTE — Progress Notes (Signed)
   Subjective:    HPI  F/u ADD F/u cramps F/u hot flashes  BP Readings from Last 3 Encounters:  11/15/12 114/78  08/16/12 124/78  08/06/12 114/80   Wt Readings from Last 3 Encounters:  11/15/12 154 lb (69.854 kg)  08/16/12 150 lb (68.04 kg)  08/06/12 154 lb (69.854 kg)      Review of Systems  Constitutional: Negative for chills and diaphoresis.  Eyes: Negative for visual disturbance.  Respiratory: Negative for cough.   Cardiovascular: Negative for chest pain, palpitations and leg swelling.  Psychiatric/Behavioral: Negative for behavioral problems and sleep disturbance. The patient is not nervous/anxious.        Objective:   Physical Exam  Constitutional: She appears well-developed. No distress.  HENT:  Head: Normocephalic.  Right Ear: External ear normal.  Left Ear: External ear normal.  Nose: Nose normal.  Mouth/Throat: Oropharynx is clear and moist.  Eyes: Conjunctivae are normal. Pupils are equal, round, and reactive to light. Right eye exhibits no discharge. Left eye exhibits no discharge.  Neck: Normal range of motion. Neck supple. No JVD present. No tracheal deviation present. No thyromegaly present.  Cardiovascular: Normal rate, regular rhythm and normal heart sounds.   Pulmonary/Chest: No stridor. No respiratory distress. She has no wheezes.  Abdominal: Soft. Bowel sounds are normal. She exhibits no distension and no mass. There is no tenderness. There is no rebound and no guarding.  Musculoskeletal: She exhibits no edema and no tenderness.  Lymphadenopathy:    She has no cervical adenopathy.  Neurological: She displays normal reflexes. No cranial nerve deficit. She exhibits normal muscle tone. Coordination normal.  Skin: No rash noted. No erythema.  Psychiatric: She has a normal mood and affect. Her behavior is normal. Judgment and thought content normal.    Lab Results  Component Value Date   WBC 7.5 11/09/2012   HGB 13.7 11/09/2012   HCT 39.4  11/09/2012   PLT 383.0 11/09/2012   GLUCOSE 99 11/09/2012   CHOL 210* 11/09/2012   TRIG 38.0 11/09/2012   HDL 99.10 11/09/2012   LDLDIRECT 101.3 11/09/2012   LDLCALC 98 04/23/2011   ALT 16 11/09/2012   AST 26 11/09/2012   NA 136 11/09/2012   K 3.7 11/09/2012   CL 100 11/09/2012   CREATININE 0.7 11/09/2012   BUN 11 11/09/2012   CO2 29 11/09/2012   TSH 1.73 11/09/2012         Assessment & Plan:

## 2012-11-15 NOTE — Patient Instructions (Signed)
   Milk free trial (no milk, ice cream, cheese and yogurt) for 4-6 weeks. OK to use almond, coconut, rice milk. "Almond breeze" brand tastes good.  

## 2012-11-15 NOTE — Assessment & Plan Note (Signed)
Continue with current prescription therapy as reflected on the Med list.  

## 2013-02-15 ENCOUNTER — Ambulatory Visit (INDEPENDENT_AMBULATORY_CARE_PROVIDER_SITE_OTHER): Payer: BC Managed Care – PPO | Admitting: Internal Medicine

## 2013-02-15 ENCOUNTER — Encounter: Payer: Self-pay | Admitting: Internal Medicine

## 2013-02-15 VITALS — BP 140/80 | HR 72 | Temp 98.6°F | Resp 16 | Wt 154.0 lb

## 2013-02-15 DIAGNOSIS — M25519 Pain in unspecified shoulder: Secondary | ICD-10-CM

## 2013-02-15 MED ORDER — AMPHETAMINE-DEXTROAMPHETAMINE 20 MG PO TABS: 20.0000 mg | ORAL_TABLET | Freq: Two times a day (BID) | ORAL | Status: DC

## 2013-02-15 MED ORDER — MELOXICAM 15 MG PO TABS: 15.0000 mg | ORAL_TABLET | Freq: Every day | ORAL | Status: DC

## 2013-02-15 NOTE — Progress Notes (Signed)
   Subjective:    HPI  F/u ADD F/u cramps F/u hot flashes C/o R shoulder pain after serving a tennis ball  BP Readings from Last 3 Encounters:  02/15/13 140/80  11/15/12 114/78  08/16/12 124/78   Wt Readings from Last 3 Encounters:  02/15/13 154 lb (69.854 kg)  11/15/12 154 lb (69.854 kg)  08/16/12 150 lb (68.04 kg)      Review of Systems  Constitutional: Negative for chills and diaphoresis.  Eyes: Negative for visual disturbance.  Respiratory: Negative for cough.   Cardiovascular: Negative for chest pain, palpitations and leg swelling.  Psychiatric/Behavioral: Negative for behavioral problems and sleep disturbance. The patient is not nervous/anxious.        Objective:   Physical Exam  Constitutional: She appears well-developed. No distress.  HENT:  Head: Normocephalic.  Right Ear: External ear normal.  Left Ear: External ear normal.  Nose: Nose normal.  Mouth/Throat: Oropharynx is clear and moist.  Eyes: Conjunctivae are normal. Pupils are equal, round, and reactive to light. Right eye exhibits no discharge. Left eye exhibits no discharge.  Neck: Normal range of motion. Neck supple. No JVD present. No tracheal deviation present. No thyromegaly present.  Cardiovascular: Normal rate, regular rhythm and normal heart sounds.   Pulmonary/Chest: No stridor. No respiratory distress. She has no wheezes.  Abdominal: Soft. Bowel sounds are normal. She exhibits no distension and no mass. There is no tenderness. There is no rebound and no guarding.  Musculoskeletal: She exhibits no edema and no tenderness.  Lymphadenopathy:    She has no cervical adenopathy.  Neurological: She displays normal reflexes. No cranial nerve deficit. She exhibits normal muscle tone. Coordination normal.  Skin: No rash noted. No erythema.  Psychiatric: She has a normal mood and affect. Her behavior is normal. Judgment and thought content normal.  R shoulder is tender w/ROM  Lab Results   Component Value Date   WBC 7.5 11/09/2012   HGB 13.7 11/09/2012   HCT 39.4 11/09/2012   PLT 383.0 11/09/2012   GLUCOSE 99 11/09/2012   CHOL 210* 11/09/2012   TRIG 38.0 11/09/2012   HDL 99.10 11/09/2012   LDLDIRECT 101.3 11/09/2012   LDLCALC 98 04/23/2011   ALT 16 11/09/2012   AST 26 11/09/2012   NA 136 11/09/2012   K 3.7 11/09/2012   CL 100 11/09/2012   CREATININE 0.7 11/09/2012   BUN 11 11/09/2012   CO2 29 11/09/2012   TSH 1.73 11/09/2012         Assessment & Plan:

## 2013-02-15 NOTE — Progress Notes (Deleted)
Pre visit review using our clinic review tool, if applicable. No additional management support is needed unless otherwise documented below in the visit note. 

## 2013-02-15 NOTE — Assessment & Plan Note (Signed)
R 1/15 after threw a ball

## 2013-02-18 ENCOUNTER — Encounter: Payer: Self-pay | Admitting: Family Medicine

## 2013-02-18 ENCOUNTER — Ambulatory Visit (INDEPENDENT_AMBULATORY_CARE_PROVIDER_SITE_OTHER): Payer: BC Managed Care – PPO

## 2013-02-18 ENCOUNTER — Ambulatory Visit (INDEPENDENT_AMBULATORY_CARE_PROVIDER_SITE_OTHER): Payer: BC Managed Care – PPO | Admitting: Family Medicine

## 2013-02-18 VITALS — BP 122/80 | HR 85 | Temp 97.9°F | Wt 160.2 lb

## 2013-02-18 DIAGNOSIS — M25511 Pain in right shoulder: Secondary | ICD-10-CM

## 2013-02-18 DIAGNOSIS — IMO0002 Reserved for concepts with insufficient information to code with codable children: Secondary | ICD-10-CM

## 2013-02-18 DIAGNOSIS — M25519 Pain in unspecified shoulder: Secondary | ICD-10-CM

## 2013-02-18 DIAGNOSIS — S43429A Sprain of unspecified rotator cuff capsule, initial encounter: Secondary | ICD-10-CM

## 2013-02-18 DIAGNOSIS — M7550 Bursitis of unspecified shoulder: Secondary | ICD-10-CM | POA: Insufficient documentation

## 2013-02-18 DIAGNOSIS — M75101 Unspecified rotator cuff tear or rupture of right shoulder, not specified as traumatic: Secondary | ICD-10-CM

## 2013-02-18 DIAGNOSIS — M75100 Unspecified rotator cuff tear or rupture of unspecified shoulder, not specified as traumatic: Secondary | ICD-10-CM

## 2013-02-18 NOTE — Progress Notes (Signed)
[facility] [address] [place], [place] [address] Phone: [phone] Subjective:    I'm seeing this patient by the request  of:  Sonda Primes, MD   CC: right shoulder pain  Heather Freeman is a 48 y.o. female coming in with complaint of right shoulder pain. Patient states that she's had this pain for approximately 1 month. Patient remembers doing some repetitive motion with yard work and states that this could have contributed. Patient did not hear a pop but she states that she felt a pop at one time when throwing a tennis ball with her dog as well during this time. Patient denies any radiation of pain, any numbness, or any weakness in the upper [place]. Patient states though that this pain is starting to seem to get worse and wakes her up at night. Describes it as a dull aching sensation with a sharp pain when she tries to abductor her arm. Patient rates the severity is 6/10. Denies any true injury to the shoulder ever. Patient was given meloxicam as well as tramadol by primary care provider with minimal improvement.     Past medical history, social, surgical and family history all reviewed in electronic medical record.   Review of Systems: No headache, visual changes, nausea, vomiting, diarrhea, constipation, dizziness, abdominal pain, skin rash, fevers, chills, night sweats, weight loss, swollen lymph nodes, body aches, joint swelling, muscle aches, chest pain, shortness of breath, mood changes.   Objective Blood pressure 122/80, pulse 85, temperature 97.9 F (36.6 C), temperature source Oral, weight 160 lb 3.2 oz (72.666 kg), SpO2 98.00%.  General: No apparent distress alert and oriented x3 mood and affect normal, dressed appropriately.  HEENT: Pupils equal, extraocular movements intact  Respiratory: Patient's speak in full sentences and does not appear short of breath  Cardiovascular: No lower extremity edema, non tender, no erythema    Skin: Warm dry intact with no signs of infection or rash on extremities or on axial skeleton.  Abdomen: Soft nontender  Neuro: Cranial nerves II through XII are intact, neurovascularly intact in all extremities with 2+ DTRs and 2+ pulses.  Lymph: No lymphadenopathy of posterior or anterior cervical chain or axillae bilaterally.  Gait normal with good balance and coordination.  MSK: Non tender with full range of motion and good stability and symmetric strength and tone of  elbows, wrist, knee and ankles bilaterally.  Shoulder: Right Inspection reveals no abnormalities, atrophy or asymmetry. Palpation is normal with no tenderness over AC joint or bicipital groove. ROM is full in all planes. Rotator cuff strength normal throughout. signs of impingement with negative Neer and Hawkin's tests, negative empty can sign. Speeds and Yergason's tests normal. No labral pathology noted with negative Obrien's, negative clunk and good stability. Normal scapular function observed. No painful arc and no drop arm sign. No apprehension sign   MSK US performed of: Right shoulder This study was ordered, performed, and interpreted by Terrilee Files D.O.  Shoulder:   Supraspinatus:  Appears normal on long and transverse views,  bursal bulge seen with shoulder abduction on impingement view. Pictures taken Infraspinatus:  Appears normal on long and transverse views. Subscapularis:  Appears normal on long and transverse views. Teres Minor:  Appears normal on long and transverse views. AC joint:  Capsule undistended, no geyser sign. Glenohumeral Joint:  Appears normal without effusion. Glenoid Labrum:  Intact without visualized tears. Biceps Tendon:  Appears normal on long and transverse views, no fraying of tendon,  tendon located in intertubercular groove, no subluxation with shoulder internal or external rotation. No increased power doppler signal.  Impression: Subacromial bursitis  Procedure: Real-time  Ultrasound Guided Injection of right glenohumeral joint Device: GE Logiq E  Ultrasound guided injection is preferred based studies that show increased duration, increased effect, greater accuracy, decreased procedural pain, increased response rate with ultrasound guided versus blind injection.  Verbal informed consent obtained.  Time-out conducted.  Noted no overlying erythema, induration, or other signs of local infection.  Skin prepped in a sterile fashion.  Local anesthesia: Topical Ethyl chloride.  With sterile technique and under real time ultrasound guidance:  Joint visualized.  23g 1  inch needle inserted posterior approach. Pictures taken for needle placement. Patient did have injection of 2 cc of 1% lidocaine, 2 cc of 0.5% Marcaine, and 1.0 cc of Kenalog 40 mg/dL. Completed without difficulty  Pain immediately resolved suggesting accurate placement of the medication.  Advised to call if fevers/chills, erythema, induration, drainage, or persistent bleeding.  Images permanently stored and available for review in the ultrasound unit.  Impression: Technically successful ultrasound guided injection.     Impression and Recommendations:     This case required medical decision making of moderate complexity.

## 2013-02-18 NOTE — Patient Instructions (Addendum)
Good to meet you  Try exercises most days of the week Ice 20 minutes 2 times daily.  Meloxicam daily for 10 days.  Vitamin D 2000 IU daily.  Tumeric 500mg  twice daily.  Tramadol at night if needed Come back in 3-4 weeks.

## 2013-02-18 NOTE — Assessment & Plan Note (Deleted)
Patient does have tear what appeared to be on ultrasound as well as testing. Patient is compensating well with good strength. We discussed different treatment options, prognosis, as well as rehabilitation for either of the treatment options. Patient decided he would like conservative approach but with an injection. Patient was given a steroid injection today as stated above. Home exercise program given with theraband.  Discussed icing protocol and over-the-counter anti-inflammatories that could be beneficial. Prescription given as stated in the electronic medical record. Patient was also sent to formal physical therapy. Will come back again in 3-4 weeks for further evaluation. 

## 2013-02-18 NOTE — Progress Notes (Signed)
Pre visit review using our clinic review tool, if applicable. No additional management support is needed unless otherwise documented below in the visit note. 

## 2013-02-18 NOTE — Assessment & Plan Note (Signed)
Patient was given an injection today and tolerated it well with improved range of motion and decrease pain immediately. Home exercise program given. This is continuing meloxicam daily for another 10 days. Tramadol as needed Icing protocol Return to clinic again in 3 weeks for further evaluation.

## 2013-03-11 ENCOUNTER — Encounter: Payer: Self-pay | Admitting: Family Medicine

## 2013-03-11 ENCOUNTER — Other Ambulatory Visit (INDEPENDENT_AMBULATORY_CARE_PROVIDER_SITE_OTHER): Payer: BC Managed Care – PPO

## 2013-03-11 ENCOUNTER — Ambulatory Visit (INDEPENDENT_AMBULATORY_CARE_PROVIDER_SITE_OTHER): Payer: BC Managed Care – PPO | Admitting: Family Medicine

## 2013-03-11 VITALS — BP 122/68 | HR 84 | Temp 97.8°F | Resp 16 | Wt 155.0 lb

## 2013-03-11 DIAGNOSIS — IMO0002 Reserved for concepts with insufficient information to code with codable children: Secondary | ICD-10-CM

## 2013-03-11 DIAGNOSIS — M7550 Bursitis of unspecified shoulder: Secondary | ICD-10-CM

## 2013-03-11 DIAGNOSIS — M19019 Primary osteoarthritis, unspecified shoulder: Secondary | ICD-10-CM

## 2013-03-11 DIAGNOSIS — M75100 Unspecified rotator cuff tear or rupture of unspecified shoulder, not specified as traumatic: Secondary | ICD-10-CM

## 2013-03-11 DIAGNOSIS — M25519 Pain in unspecified shoulder: Secondary | ICD-10-CM

## 2013-03-11 MED ORDER — NITROGLYCERIN 0.2 MG/HR TD PT24: MEDICATED_PATCH | TRANSDERMAL | Status: DC

## 2013-03-11 NOTE — Assessment & Plan Note (Signed)
Patient had injection today to see if this was the diagnostic as well as therapeutic. Patient states that it did help significantly. Hopefully this is more of a a.c. joint problem. Patient will try exercises now only one time a day 4 times a week. We discussed continuing the icing protocol. Patient and will come back in 3-4 weeks. His continuing and pain I would like to get x-rays of the shoulder and we need to consider formal physical therapy. I would be concerned that patient's a.c. 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

## 2013-03-11 NOTE — Progress Notes (Signed)
Pre visit review using our clinic review tool, if applicable. No additional management support is needed unless otherwise documented below in the visit note. 

## 2013-03-11 NOTE — Assessment & Plan Note (Signed)
Trace swelling still noted to did not think that this is the problem. Patient will try and nitroglycerin patch and was given a prescription for this if not better in 48 hours of the a.c. joint injection. Patient will follow up in 3 weeks

## 2013-03-11 NOTE — Progress Notes (Signed)
Heather Freeman D.O. [facility] [address] [place], [place] [address] Phone: [phone](336) [phone] Subjective:    CC: right shoulder pain follow up  BJY:NWGNFAOZHYHPI:Subjective Heather BernKathryn O Freeman is a 48 y.o. female coming in with complaint of right shoulder pain follow up. Patient was seen previously for right shoulder pain and was diagnosed with a subacromial bursitis and did have an injection. Patient was given home exercise program, anti-inflammatories, as well as an icing protocol. Patient states she is still in some pain mostly on the anterior superior aspect of the shoulder. Patient notices it more when she reaches across her body. Denies any weakness. Overall she did say she is about 10-15% better. Patient has been doing the exercises twice daily thinking that this would be helpful. Stop the meloxicam. No nighttime pain. Worse when sitting. No neck pain.     Past medical history, social, surgical and family history all reviewed in electronic medical record.   Review of Systems: No headache, visual changes, nausea, vomiting, diarrhea, constipation, dizziness, abdominal pain, skin rash, fevers, chills, night sweats, weight loss, swollen lymph nodes, body aches, joint swelling, muscle aches, chest pain, shortness of breath, mood changes.   Objective Blood pressure 122/68, pulse 84, temperature 97.8 F (36.6 C), temperature source Oral, resp. rate 16, weight 155 lb (70.308 kg), SpO2 99.00%.  General: No apparent distress alert and oriented x3 mood and affect normal, dressed appropriately.  HEENT: Pupils equal, extraocular movements intact  Respiratory: Patient's speak in full sentences and does not appear short of breath  Cardiovascular: No lower extremity edema, non tender, no erythema  Skin: Warm dry intact with no signs of infection or rash on extremities or on axial skeleton.  Abdomen: Soft nontender  Neuro: Cranial nerves II through XII are intact, neurovascularly intact in all extremities with  2+ DTRs and 2+ pulses.  Lymph: No lymphadenopathy of posterior or anterior cervical chain or axillae bilaterally.  Gait normal with good balance and coordination.  MSK: Non tender with full range of motion and good stability and symmetric strength and tone of  elbows, wrist, knee and ankles bilaterally.  Shoulder: Right Inspection reveals no abnormalities, atrophy or asymmetry. Palpation is normal with no tenderness over AC joint or bicipital groove. ROM is full in all planes. Rotator cuff strength normal throughout. signs of impingement with negative Neer and Hawkin's tests, negative empty can sign. Positive crossover sign Speeds and Yergason's tests normal. No labral pathology noted with negative Obrien's, negative clunk and good stability. Normal scapular function observed. No painful arc and no drop arm sign. No apprehension sign  MSK US performed of: Right shoulder This study was ordered, performed, and interpreted by Terrilee FilesZach Freeman D.O.  Shoulder:   Supraspinatus:  Appears normal on long and transverse views, no bursal bulge seen with shoulder abduction on impingement view. Significant underlying osteoarthritic changes of the a.c. to Infraspinatus:  Appears normal on long and transverse views. Subscapularis:  Appears normal on long and transverse views. Teres Minor:  Appears normal on long and transverse views. 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 Glenohumeral Joint:  Appears normal without effusion. Glenoid Labrum:  Intact without visualized tears. Biceps Tendon:  Appears normal on long and transverse views, no fraying of tendon, tendon located in intertubercular groove, no subluxation with shoulder internal or external rotation. No increased power doppler signal.  After verbal consent patient was prepped with alcohol swabs and with a 27-gauge 1-1/2 inch needle was injected with 1 cc of 0.5% Marcaine and  1 cc of Kenalog 40 mg/dL into the right a.c. joint under ultrasound  guidance. Patient tolerated the procedure very well. Post injection instructions given.    Impression and Recommendations:     This case required medical decision making of moderate complexity.

## 2013-03-11 NOTE — Patient Instructions (Addendum)
It is good to see you.  Continue the exercises 4 times a week for the next 6 weeks.   Nitroglycerin Protocol   Apply 1/4 nitroglycerin patch to affected area daily.  Change position of patch within the affected area every 24 hours.  You may experience a headache during the first 1-2 weeks of using the patch, these should subside.  If you experience headaches after beginning nitroglycerin patch treatment, you may take your preferred over the counter pain reliever.  Another side effect of the nitroglycerin patch is skin irritation or rash related to patch adhesive.  Please notify our office if you develop more severe headaches or rash, and stop the patch.  Tendon healing with nitroglycerin patch may require 12 to 24 weeks depending on the extent of injury.  Men should not use if taking Viagra, Cialis, or Levitra.   Do not use if you have migraines or rosacea.  Com back in 3-4 weeks.

## 2013-03-22 ENCOUNTER — Other Ambulatory Visit: Payer: Self-pay | Admitting: Internal Medicine

## 2013-04-01 ENCOUNTER — Ambulatory Visit (INDEPENDENT_AMBULATORY_CARE_PROVIDER_SITE_OTHER)
Admission: RE | Admit: 2013-04-01 | Discharge: 2013-04-01 | Disposition: A | Discharge status: Home / Self Care | Payer: BC Managed Care – PPO | Source: Ambulatory Visit | Attending: Family Medicine | Admitting: Family Medicine

## 2013-04-01 ENCOUNTER — Ambulatory Visit (INDEPENDENT_AMBULATORY_CARE_PROVIDER_SITE_OTHER): Payer: BC Managed Care – PPO | Admitting: Family Medicine

## 2013-04-01 VITALS — BP 126/72 | HR 84

## 2013-04-01 DIAGNOSIS — M25519 Pain in unspecified shoulder: Secondary | ICD-10-CM

## 2013-04-01 DIAGNOSIS — M25511 Pain in right shoulder: Secondary | ICD-10-CM

## 2013-04-01 NOTE — Patient Instructions (Signed)
Good to see you Ice before bed  Continue the patch Work on range of movement.  Vitamin D is great Tramadol before bed is fine.  Xray downstairs today Call me if you want MRI/MRA and I will order it to rule out labral tear.  Come back in 3 weeks or 1-2 days after MRI and we will discuss findings.

## 2013-04-02 ENCOUNTER — Encounter: Payer: Self-pay | Admitting: Family Medicine

## 2013-04-02 NOTE — Assessment & Plan Note (Addendum)
Spent greater than 25 minutes with patient face-to-face and had greater than 50% of counseling including as described above in assessment and plan. Did discuss at great length. Signed do believe that patient likely has unfortunately a labral tear that continues to give her some trouble. I cannot see it on the ultrasound and good evidence. Patient's x-rays are normal with no bony abnormalities. I do think that we may want to consider an MRI for further evaluation. We discussed even if this is a labral tear she may not benefit significantly from surgical repair but she would like to have that option. Patient is going to try to continue current therapy including a nitroglycerin patches for now. Patient will call if she has any worsening pain and we will move forward with an MR arthrogram to rule out labral tear of the shoulder. Depending on findings this will further delineate management. Patient will come back one to 2 days after the MRI if this is ordered otherwise she'll followup in 3-4 weeks.

## 2013-04-02 NOTE — Progress Notes (Signed)
  Heather ScaleZach Freeman D.O. [facility] [address] [place], [place] [address] Phone: [phone](336) [phone] Subjective:    CC: right shoulder pain follow up  NFA:OZHYQMVHQIHPI:Subjective Heather BernKathryn O Freeman is a 48 y.o. female coming in with complaint of right shoulder pain follow up. Patient loosing previously and has had a subacromial injection as well as a a.c. joint injection previously. Patient states that she has gotten full range of motion but from time to time she can still have a dull aching sensation in the shoulder. Patient denies any neck pain or any radiation of pain down the arm. Patient states she is able to do all activities of daily living but would like to be pain free. Patient denies any nighttime awakening. Patient is no longer needing to take any medications other than tramadol time to time.     Past medical history, social, surgical and family history all reviewed in electronic medical record.   Review of Systems: No headache, visual changes, nausea, vomiting, diarrhea, constipation, dizziness, abdominal pain, skin rash, fevers, chills, night sweats, weight loss, swollen lymph nodes, body aches, joint swelling, muscle aches, chest pain, shortness of breath, mood changes.   Objective Blood pressure 126/72, pulse 84, SpO2 97.00%.  General: No apparent distress alert and oriented x3 mood and affect normal, dressed appropriately.  HEENT: Pupils equal, extraocular movements intact  Respiratory: Patient's speak in full sentences and does not appear short of breath  Cardiovascular: No lower extremity edema, non tender, no erythema  Skin: Warm dry intact with no signs of infection or rash on extremities or on axial skeleton.  Abdomen: Soft nontender  Neuro: Cranial nerves II through XII are intact, neurovascularly intact in all extremities with 2+ DTRs and 2+ pulses.  Lymph: No lymphadenopathy of posterior or anterior cervical chain or axillae bilaterally.  Gait normal with good balance and  coordination.  MSK: Non tender with full range of motion and good stability and symmetric strength and tone of  elbows, wrist, knee and ankles bilaterally.  Shoulder: Right Inspection reveals no abnormalities, atrophy or asymmetry. Palpation is normal with no tenderness over AC joint or bicipital groove. ROM is full in all planes. Rotator cuff strength normal throughout. signs of impingement with positive Neer and Hawkin's tests, negative empty can sign. Positive crossover sign Speeds and Yergason's tests normal. No labral pathology noted with negative Obrien's, negative clunk and good stability. Normal scapular function observed. No painful arc and no drop arm sign. No apprehension sign  X-rays were ordered reviewed and interpreted by me today. X-rays show that patient has minimal degenerative changes of the a.c. joint but otherwise unremarkable.   Impression and Recommendations:     This case required medical decision making of moderate complexity.

## 2013-04-04 ENCOUNTER — Encounter: Payer: Self-pay | Admitting: Family Medicine

## 2013-04-04 DIAGNOSIS — M25519 Pain in unspecified shoulder: Secondary | ICD-10-CM

## 2013-04-05 NOTE — Addendum Note (Signed)
Addended by: Edwena FeltyARSON, Ladarius Seubert T on: 04/05/2013 10:25 AM   Modules accepted: Orders

## 2013-04-13 ENCOUNTER — Other Ambulatory Visit: Payer: Self-pay | Admitting: *Deleted

## 2013-04-13 DIAGNOSIS — M25519 Pain in unspecified shoulder: Secondary | ICD-10-CM

## 2013-04-13 HISTORY — PX: SHOULDER SURGERY: SHX246

## 2013-04-21 ENCOUNTER — Telehealth: Payer: Self-pay | Admitting: Internal Medicine

## 2013-04-21 MED ORDER — AZITHROMYCIN 250 MG PO TABS: ORAL_TABLET | ORAL | Status: DC

## 2013-04-21 MED ORDER — PROMETHAZINE-CODEINE 6.25-10 MG/5ML PO SYRP: 5.0000 mL | ORAL_SOLUTION | ORAL | Status: DC | PRN

## 2013-04-21 NOTE — Telephone Encounter (Signed)
Bad URI: Prom-cod Zithromax  Thx

## 2013-04-22 ENCOUNTER — Ambulatory Visit
Admission: RE | Admit: 2013-04-22 | Discharge: 2013-04-22 | Disposition: A | Discharge status: Home / Self Care | Payer: BC Managed Care – PPO | Source: Ambulatory Visit | Attending: Family Medicine | Admitting: Family Medicine

## 2013-04-22 DIAGNOSIS — M25519 Pain in unspecified shoulder: Secondary | ICD-10-CM

## 2013-04-22 MED ORDER — IOHEXOL 180 MG/ML  SOLN
15.0000 mL | Freq: Once | INTRAMUSCULAR | Status: AC | PRN
  Administered 2013-04-22: 15 mL via INTRA_ARTICULAR

## 2013-04-25 ENCOUNTER — Encounter: Payer: Self-pay | Admitting: Family Medicine

## 2013-04-27 ENCOUNTER — Encounter: Payer: Self-pay | Admitting: Family Medicine

## 2013-04-27 ENCOUNTER — Ambulatory Visit (INDEPENDENT_AMBULATORY_CARE_PROVIDER_SITE_OTHER): Payer: BC Managed Care – PPO | Admitting: Family Medicine

## 2013-04-27 VITALS — BP 140/84 | HR 105

## 2013-04-27 DIAGNOSIS — M75100 Unspecified rotator cuff tear or rupture of unspecified shoulder, not specified as traumatic: Secondary | ICD-10-CM

## 2013-04-27 DIAGNOSIS — S43429A Sprain of unspecified rotator cuff capsule, initial encounter: Secondary | ICD-10-CM

## 2013-04-27 NOTE — Patient Instructions (Signed)
Good to see you You have a full thickness tear and I would recommend surgery.  I am here if you need me Dr. Ave Filterhandler on Monday 20th @ 9 am.

## 2013-04-27 NOTE — Progress Notes (Signed)
  Tawana ScaleZach Porfiria Freeman D.O. [facility] [address] [place], [place] [address] Phone: [phone](336) [phone] Subjective:    CC: right shoulder pain follow up  FIE:PPIRJJOACZHPI:Subjective Heather BernKathryn O Freeman is a 48 y.o. female coming in with complaint of right shoulder pain follow up. Patient was seen previously and did not respond very well to any conservative measures including intra-articular steroid injections, physical therapy, as well as medications. Patient continued to have pain and noticed some mild weakness of the upper extremity the patient was sent for an MRI. Patient is here for further evaluation and to discuss MRI results.  MRI was reviewed by me today in patient does have what appears to be a full-thickness near complete tear of the supraspinatus tendon of his right shoulder but does have some mild retraction.     Past medical history, social, surgical and family history all reviewed in electronic medical record.   Review of Systems: No headache, visual changes, nausea, vomiting, diarrhea, constipation, dizziness, abdominal pain, skin rash, fevers, chills, night sweats, weight loss, swollen lymph nodes, body aches, joint swelling, muscle aches, chest pain, shortness of breath, mood changes.   Objective Blood pressure 140/84, pulse 105, SpO2 99.00%.  General: No apparent distress alert and oriented x3 mood and affect normal, dressed appropriately.  HEENT: Pupils equal, extraocular movements intact  Respiratory: Patient's speak in full sentences and does not appear short of breath  Cardiovascular: No lower extremity edema, non tender, no erythema  Skin: Warm dry intact with no signs of infection or rash on extremities or on axial skeleton.  Abdomen: Soft nontender  Neuro: Cranial nerves II through XII are intact, neurovascularly intact in all extremities with 2+ DTRs and 2+ pulses.  Lymph: No lymphadenopathy of posterior or anterior cervical chain or axillae bilaterally.  Gait normal with good  balance and coordination.  MSK: Non tender with full range of motion and good stability and symmetric strength and tone of  elbows, wrist, knee and ankles bilaterally.  Shoulder: Right Inspection reveals no abnormalities, atrophy or asymmetry. Palpation is normal with no tenderness over AC joint or bicipital groove. ROM is full in all planes passively actively patient has forward flexion to 170, internal rotation to sacrum, external rotation of 25. Rotator cuff strength 4-5 compared to 5 out of 5 on the contralateral side signs of impingement with positive Neer and Hawkin's tests, negative empty can sign. Positive crossover sign Speeds and Yergason's tests normal. No labral pathology noted with negative Obrien's, negative clunk and good stability. Normal scapular function observed. No painful arc and no drop arm sign. No apprehension sign Contralateral shoulder is unremarkable.   Impression and Recommendations:     This case required medical decision making of moderate complexity.

## 2013-04-27 NOTE — Assessment & Plan Note (Signed)
Patient does have right-sided tear with full-thickness tear of the supraspinatus. Patient has failed all conservative measures including intra-articular injection as well as formal physical therapy without any significant improvement. Patient does have some mild retraction I think surgical intervention would be the next best step. [facility] Patient has an appointment Friday. Discussed with patient at great length. Spent greater than 25 minutes with patient face-to-face and had greater than 50% of counseling including as described above in assessment and plan.

## 2013-05-17 ENCOUNTER — Ambulatory Visit: Payer: BC Managed Care – PPO | Admitting: Internal Medicine

## 2013-06-14 ENCOUNTER — Ambulatory Visit (INDEPENDENT_AMBULATORY_CARE_PROVIDER_SITE_OTHER): Payer: BC Managed Care – PPO | Admitting: Internal Medicine

## 2013-06-14 ENCOUNTER — Encounter: Payer: Self-pay | Admitting: Internal Medicine

## 2013-06-14 VITALS — BP 110/74 | HR 72 | Temp 98.1°F | Resp 16

## 2013-06-14 DIAGNOSIS — S43429A Sprain of unspecified rotator cuff capsule, initial encounter: Secondary | ICD-10-CM

## 2013-06-14 DIAGNOSIS — F988 Other specified behavioral and emotional disorders with onset usually occurring in childhood and adolescence: Secondary | ICD-10-CM

## 2013-06-14 DIAGNOSIS — N946 Dysmenorrhea, unspecified: Secondary | ICD-10-CM

## 2013-06-14 DIAGNOSIS — M75100 Unspecified rotator cuff tear or rupture of unspecified shoulder, not specified as traumatic: Secondary | ICD-10-CM

## 2013-06-14 MED ORDER — TRAMADOL HCL 50 MG PO TABS: 50.0000 mg | ORAL_TABLET | Freq: Two times a day (BID) | ORAL | Status: DC | PRN

## 2013-06-14 MED ORDER — AMPHETAMINE-DEXTROAMPHETAMINE 20 MG PO TABS: 20.0000 mg | ORAL_TABLET | Freq: Two times a day (BID) | ORAL | Status: DC

## 2013-06-14 NOTE — Assessment & Plan Note (Signed)
Dr Ave Filter repaired

## 2013-06-14 NOTE — Assessment & Plan Note (Signed)
Continue with current prn Tramadol prescription therapy as reflected on the Med list.

## 2013-06-14 NOTE — Progress Notes (Signed)
Pre visit review using our clinic review tool, if applicable. No additional management support is needed unless otherwise documented below in the visit note. 

## 2013-06-14 NOTE — Progress Notes (Signed)
Patient ID: Heather Freeman, female   DOB: [birth year], 48 y.o.   MRN: [id number]   Subjective:    HPI  F/u ADD F/u cramps F/u hot flashes F/u R shoulder pain after serving a tennis ball - had to have surgery -- Dr Ave Filter  BP Readings from Last 3 Encounters:  06/14/13 110/74  04/27/13 140/84  04/01/13 126/72   Wt Readings from Last 3 Encounters:  03/11/13 155 lb (70.308 kg)  02/18/13 160 lb 3.2 oz (72.666 kg)  02/15/13 154 lb (69.854 kg)      Review of Systems  Constitutional: Negative for chills and diaphoresis.  Eyes: Negative for visual disturbance.  Respiratory: Negative for cough.   Cardiovascular: Negative for chest pain, palpitations and leg swelling.  Psychiatric/Behavioral: Negative for behavioral problems and sleep disturbance. The patient is not nervous/anxious.        Objective:   Physical Exam  Constitutional: She appears well-developed. No distress.  HENT:  Head: Normocephalic.  Right Ear: External ear normal.  Left Ear: External ear normal.  Nose: Nose normal.  Mouth/Throat: Oropharynx is clear and moist.  Eyes: Conjunctivae are normal. Pupils are equal, round, and reactive to light. Right eye exhibits no discharge. Left eye exhibits no discharge.  Neck: Normal range of motion. Neck supple. No JVD present. No tracheal deviation present. No thyromegaly present.  Cardiovascular: Normal rate, regular rhythm and normal heart sounds.   Pulmonary/Chest: No stridor. No respiratory distress. She has no wheezes.  Abdominal: Soft. Bowel sounds are normal. She exhibits no distension and no mass. There is no tenderness. There is no rebound and no guarding.  Musculoskeletal: She exhibits no edema and no tenderness.  Lymphadenopathy:    She has no cervical adenopathy.  Neurological: She displays normal reflexes. No cranial nerve deficit. She exhibits normal muscle tone. Coordination normal.  Skin: No rash noted. No erythema.  Psychiatric: She has a normal mood  and affect. Her behavior is normal. Judgment and thought content normal.  R shoulder is tender w/ROM - better post-op  Lab Results  Component Value Date   WBC 7.5 11/09/2012   HGB 13.7 11/09/2012   HCT 39.4 11/09/2012   PLT 383.0 11/09/2012   GLUCOSE 99 11/09/2012   CHOL 210* 11/09/2012   TRIG 38.0 11/09/2012   HDL 99.10 11/09/2012   LDLDIRECT 101.3 11/09/2012   LDLCALC 98 04/23/2011   ALT 16 11/09/2012   AST 26 11/09/2012   NA 136 11/09/2012   K 3.7 11/09/2012   CL 100 11/09/2012   CREATININE 0.7 11/09/2012   BUN 11 11/09/2012   CO2 29 11/09/2012   TSH 1.73 11/09/2012         Assessment & Plan:

## 2013-06-14 NOTE — Assessment & Plan Note (Signed)
Continue with current prescription therapy as reflected on the Med list.  

## 2013-08-17 ENCOUNTER — Ambulatory Visit (INDEPENDENT_AMBULATORY_CARE_PROVIDER_SITE_OTHER): Payer: BC Managed Care – PPO | Admitting: Gynecology

## 2013-08-17 ENCOUNTER — Encounter: Payer: Self-pay | Admitting: Gynecology

## 2013-08-17 VITALS — BP 118/76 | Ht 65.0 in | Wt 146.6 lb

## 2013-08-17 DIAGNOSIS — Z780 Asymptomatic menopausal state: Secondary | ICD-10-CM

## 2013-08-17 DIAGNOSIS — R51 Headache: Secondary | ICD-10-CM

## 2013-08-17 DIAGNOSIS — N951 Menopausal and female climacteric states: Secondary | ICD-10-CM

## 2013-08-17 DIAGNOSIS — Z01419 Encounter for gynecological examination (general) (routine) without abnormal findings: Secondary | ICD-10-CM

## 2013-08-17 MED ORDER — SUMATRIPTAN SUCCINATE 50 MG PO TABS: ORAL_TABLET | ORAL | Status: DC

## 2013-08-17 NOTE — Patient Instructions (Addendum)
Sumatriptan tablets What is this medicine? SUMATRIPTAN (soo ma TRIP tan) is used to treat migraines with or without aura. An aura is a strange feeling or visual disturbance that warns you of an attack. It is not used to prevent migraines. This medicine may be used for other purposes; ask your health care provider or pharmacist if you have questions. COMMON BRAND NAME(S): Imitrex What should I tell my health care provider before I take this medicine? They need to know if you have any of these conditions: -bowel disease or colitis -diabetes -family history of heart disease -fast or irregular heart beat -heart or blood vessel disease, angina (chest pain), or previous heart attack -high blood pressure -high cholesterol -history of stroke, transient ischemic attacks (TIAs or mini-strokes), or intracranial bleeding -kidney or liver disease -overweight -poor circulation -postmenopausal or surgical removal of uterus and ovaries -Raynaud's disease -seizure disorder -an unusual or allergic reaction to sumatriptan, other medicines, foods, dyes, or preservatives -pregnant or trying to get pregnant -breast-feeding How should I use this medicine? Take this medicine by mouth with a glass of water. Follow the directions on the prescription label. This medicine is taken at the first symptoms of a migraine. It is not for everyday use. If your migraine headache returns after one dose, you can take another dose as directed. You must leave at least 2 hours between doses, and do not take more than 100 mg as a single dose. Do not take more than 200 mg total in any 24 hour period. If there is no improvement at all after the first dose, do not take a second dose without talking to your doctor or health care professional. Do not take your medicine more often than directed. Talk to your pediatrician regarding the use of this medicine in children. Special care may be needed. Overdosage: If you think you have taken  too much of this medicine contact a poison control center or emergency room at once. NOTE: This medicine is only for you. Do not share this medicine with others. What if I miss a dose? This does not apply; this medicine is not for regular use. What may interact with this medicine? Do not take this medicine with any of the following medicines: -amphetamine or cocaine -dihydroergotamine, ergotamine, ergoloid mesylates, methysergide, or ergot-type medication - do not take within 24 hours of taking sumatriptan -feverfew -MAOIs like Carbex, Eldepryl, Marplan, Nardil, and Parnate - do not take sumatriptan within 2 weeks of stopping MAOI therapy -other migraine medicines like almotriptan, eletriptan, naratriptan, rizatriptan, zolmitriptan - do not take within 24 hours of taking sumatriptan -tryptophan This medicine may also interact with the following medications: -lithium -medicines for mental depression, anxiety or mood problems -medicines for weight loss such as dexfenfluramine, dextroamphetamine, fenfluramine, or sibutramine -St. John's wort This list may not describe all possible interactions. Give your health care provider a list of all the medicines, herbs, non-prescription drugs, or dietary supplements you use. Also tell them if you smoke, drink alcohol, or use illegal drugs. Some items may interact with your medicine. What should I watch for while using this medicine? Only take this medicine for a migraine headache. Take it if you get warning symptoms or at the start of a migraine attack. It is not for regular use to prevent migraine attacks. You may get drowsy or dizzy. Do not drive, use machinery, or do anything that needs mental alertness until you know how this medicine affects you. To reduce dizzy or fainting spells, do not  sit or stand up quickly, especially if you are an older patient. Alcohol can increase drowsiness, dizziness and flushing. Avoid alcoholic drinks. Smoking cigarettes  may increase the risk of heart-related side effects from using this medicine. If you take migraine medicines for 10 or more days a month, your migraines may get worse. Keep a diary of headache days and medicine use. Contact your healthcare professional if your migraine attacks occur more frequently. What side effects may I notice from receiving this medicine? Side effects that you should report to your doctor or health care professional as soon as possible: -allergic reactions like skin rash, itching or hives, swelling of the face, lips, or tongue -fast, slow, or irregular heart beat -hallucinations -increased or decreased blood pressure -seizures -severe stomach pain and cramping, bloody diarrhea -signs and symptoms of a blood clot such as breathing problems; changes in vision; chest pain; severe, sudden headache; pain, swelling, warmth in the leg; trouble speaking; sudden numbness or weakness of the face, arm or leg -tingling, pain, or numbness in the face, hands or feet Side effects that usually do not require medical attention (report to your doctor or health care professional if they continue or are bothersome): -drowsiness -feeling warm, flushing, or redness of the face -headache -muscle cramps, pain -nausea, vomiting -unusually weak or tired This list may not describe all possible side effects. Call your doctor for medical advice about side effects. You may report side effects to FDA at 1-800-FDA-1088. Where should I keep my medicine? Keep out of the reach of children. Store at room temperature between 2 and 30 degrees C (36 and 86 degrees F). Throw away any unused medicine after the expiration date. NOTE: This sheet is a summary. It may not cover all possible information. If you have questions about this medicine, talk to your doctor, pharmacist, or health care provider.  2015, [employer]. (2012-08-31 10:12:47) Migraine Headache A migraine headache is an intense,  throbbing pain on one or both sides of your head. A migraine can last for 30 minutes to several hours. CAUSES  The exact cause of a migraine headache is not always known. However, a migraine may be caused when nerves in the brain become irritated and release chemicals that cause inflammation. This causes pain. Certain things may also trigger migraines, such as:  Alcohol.  Smoking.  Stress.  Menstruation.  Aged cheeses.  Foods or drinks that contain nitrates, glutamate, aspartame, or tyramine.  Lack of sleep.  Chocolate.  Caffeine.  Hunger.  Physical exertion.  Fatigue.  Medicines used to treat chest pain (nitroglycerine), birth control pills, estrogen, and some blood pressure medicines. SIGNS AND SYMPTOMS  Pain on one or both sides of your head.  Pulsating or throbbing pain.  Severe pain that prevents daily activities.  Pain that is aggravated by any physical activity.  Nausea, vomiting, or both.  Dizziness.  Pain with exposure to bright lights, loud noises, or activity.  General sensitivity to bright lights, loud noises, or smells. Before you get a migraine, you may get warning signs that a migraine is coming (aura). An aura may include:  Seeing flashing lights.  Seeing bright spots, halos, or zigzag lines.  Having tunnel vision or blurred vision.  Having feelings of numbness or tingling.  Having trouble talking.  Having muscle weakness. DIAGNOSIS  A migraine headache is often diagnosed based on:  Symptoms.  Physical exam.  A CT scan or MRI of your head. These imaging tests cannot diagnose migraines, but they can help  rule out other causes of headaches. TREATMENT Medicines may be given for pain and nausea. Medicines can also be given to help prevent recurrent migraines.  HOME CARE INSTRUCTIONS  Only take over-the-counter or prescription medicines for pain or discomfort as directed by your health care provider. The use of long-term narcotics is  not recommended.  Lie down in a dark, quiet room when you have a migraine.  Keep a journal to find out what may trigger your migraine headaches. For example, write down:  What you eat and drink.  How much sleep you get.  Any change to your diet or medicines.  Limit alcohol consumption.  Quit smoking if you smoke.  Get 7-9 hours of sleep, or as recommended by your health care provider.  Limit stress.  Keep lights dim if bright lights bother you and make your migraines worse. SEEK IMMEDIATE MEDICAL CARE IF:   Your migraine becomes severe.  You have a fever.  You have a stiff neck.  You have vision loss.  You have muscular weakness or loss of muscle control.  You start losing your balance or have trouble walking.  You feel faint or pass out.  You have severe symptoms that are different from your first symptoms. MAKE SURE YOU:   Understand these instructions.  Will watch your condition.  Will get help right away if you are not doing well or get worse. Document Released: 12/30/2004 Document Revised: 05/16/2013 Document Reviewed: 09/06/2012 [facility] Patient Information 2015 [place], [place]. This information is not intended to replace advice given to you by your health care provider. Make sure you discuss any questions you have with your health care provider. hrt Hormone Therapy At menopause, your body begins making less estrogen and progesterone hormones. This causes the body to stop having menstrual periods. This is because estrogen and progesterone hormones control your periods and menstrual cycle. A lack of estrogen may cause symptoms such as:  Hot flushes (or hot flashes).  Vaginal dryness.  Dry skin.  Loss of sex drive.  Risk of bone loss (osteoporosis). When this happens, you may choose to take hormone therapy to get back the estrogen lost during menopause. When the hormone estrogen is given alone, it is usually referred to as ET (Estrogen Therapy). When  the hormone progestin is combined with estrogen, it is generally called HT (Hormone Therapy). This was formerly known as hormone replacement therapy (HRT). Your caregiver can help you make a decision on what will be best for you. The decision to use HT seems to change often as new studies are done. Many studies do not agree on the benefits of hormone replacement therapy. LIKELY BENEFITS OF HT INCLUDE PROTECTION FROM:  Hot Flushes (also called hot flashes) - A hot flush is a sudden feeling of heat that spreads over the face and body. The skin may redden like a blush. It is connected with sweats and sleep disturbance. Women going through menopause may have hot flushes a few times a month or several times per day depending on the woman.  Osteoporosis (bone loss)- Estrogen helps guard against bone loss. After menopause, a woman's bones slowly lose calcium and become weak and brittle. As a result, bones are more likely to break. The hip, wrist, and spine are affected most often. Hormone therapy can help slow bone loss after menopause. Weight bearing exercise and taking calcium with vitamin D also can help prevent bone loss. There are also medications that your caregiver can prescribe that can help prevent osteoporosis.  Vaginal Dryness - Loss of estrogen causes changes in the vagina. Its lining may become thin and dry. These changes can cause pain and bleeding during sexual intercourse. Dryness can also lead to infections. This can cause burning and itching. (Vaginal estrogen treatment can help relieve pain, itching, and dryness.)  Urinary Tract Infections are more common after menopause because of lack of estrogen. Some women also develop urinary incontinence because of low estrogen levels in the vagina and bladder.  Possible other benefits of estrogen include a positive effect on mood and short-term memory in women. RISKS AND COMPLICATIONS  Using estrogen alone without progesterone causes the lining of  the uterus to grow. This increases the risk of lining of the uterus (endometrial) cancer. Your caregiver should give another hormone called progestin if you have a uterus.  Women who take combined (estrogen and progestin) HT appear to have an increased risk of breast cancer. The risk appears to be small, but increases throughout the time that HT is taken.  Combined therapy also makes the breast tissue slightly denser which makes it harder to read mammograms (breast X-rays).  Combined, estrogen and progesterone therapy can be taken together every day, in which case there may be spotting of blood. HT therapy can be taken cyclically in which case you will have menstrual periods. Cyclically means HT is taken for a set amount of days, then not taken, then this process is repeated.  HT may increase the risk of stroke, heart attack, breast cancer and forming blood clots in your leg.  Transdermal estrogen (estrogen that is absorbed through the skin with a patch or a cream) may have more positive results with:  Cholesterol.  Blood pressure.  Blood clots. Having the following conditions may indicate you should not have HT:  Endometrial cancer.  Liver disease.  Breast cancer.  Heart disease.  History of blood clots.  Stroke. TREATMENT   If you choose to take HT and have a uterus, usually estrogen and progestin are prescribed.  Your caregiver will help you decide the best way to take the medications.  Possible ways to take estrogen include:  Pills.  Patches.  Gels.  Sprays.  Vaginal estrogen cream, rings and tablets.  It is best to take the lowest dose possible that will help your symptoms and take them for the shortest period of time that you can.  Hormone therapy can help relieve some of the problems (symptoms) that affect women at menopause. Before making a decision about HT, talk to your caregiver about what is best for you. Be well informed and comfortable with your  decisions. HOME CARE INSTRUCTIONS   Follow your caregivers advice when taking the medications.  A Pap test is done to screen for cervical cancer.  The first Pap test should be done at age 64.  Between ages 70 and 46, Pap tests are repeated every 2 years.  Beginning at age 58, you are advised to have a Pap test every 3 years as long as your past 3 Pap tests have been normal.  Some women have medical problems that increase the chance of getting cervical cancer. Talk to your caregiver about these problems. It is especially important to talk to your caregiver if a new problem develops soon after your last Pap test. In these cases, your caregiver may recommend more frequent screening and Pap tests.  The above recommendations are the same for women who have or have not gotten the vaccine for HPV (Human Papillomavirus).  If you  had a hysterectomy for a problem that was not a cancer or a condition that could lead to cancer, then you no longer need Pap tests. However, even if you no longer need a Pap test, a regular exam is a good idea to make sure no other problems are starting.   If you are between ages 62 and 10, and you have had normal Pap tests going back 10 years, you no longer need Pap tests. However, even if you no longer need a Pap test, a regular exam is a good idea to make sure no other problems are starting.   If you have had past treatment for cervical cancer or a condition that could lead to cancer, you need Pap tests and screening for cancer for at least 20 years after your treatment.  If Pap tests have been discontinued, risk factors (such as a new sexual partner) need to be re-assessed to determine if screening should be resumed.  Some women may need screenings more often if they are at high risk for cervical cancer.  Get mammograms done as per the advice of your caregiver. SEEK IMMEDIATE MEDICAL CARE IF:  You develop abnormal vaginal bleeding.  You have pain or swelling  in your legs, shortness of breath, or chest pain.  You develop dizziness or headaches.  You have lumps or changes in your breasts or armpits.  You have slurred speech.  You develop weakness or numbness of your arms or legs.  You have pain, burning, or bleeding when urinating.  You develop abdominal pain. Document Released: 09/28/2002 Document Revised: 03/24/2011 Document Reviewed: 01/16/2010 [facility] Patient Information 2015 [place], [place]. This information is not intended to replace advice given to you by your health care provider. Make sure you discuss any questions you have with your health care provider. Menopause Menopause is the normal time of life when menstrual periods stop completely. Menopause is complete when you have missed 12 consecutive menstrual periods. It usually occurs between the ages of 48 years and 55 years. Very rarely does a woman develop menopause before the age of 40 years. At menopause, your ovaries stop producing the female hormones estrogen and progesterone. This can cause undesirable symptoms and also affect your health. Sometimes the symptoms may occur 4-5 years before the menopause begins. There is no relationship between menopause and:  Oral contraceptives.  Number of children you had.  Race.  The age your menstrual periods started (menarche). Heavy smokers and very thin women may develop menopause earlier in life. CAUSES  The ovaries stop producing the female hormones estrogen and progesterone.  Other causes include:  Surgery to remove both ovaries.  The ovaries stop functioning for no known reason.  Tumors of the pituitary gland in the brain.  Medical disease that affects the ovaries and hormone production.  Radiation treatment to the abdomen or pelvis.  Chemotherapy that affects the ovaries. SYMPTOMS   Hot flashes.  Night sweats.  Decrease in sex drive.  Vaginal dryness and thinning of the vagina causing painful  intercourse.  Dryness of the skin and developing wrinkles.  Headaches.  Tiredness.  Irritability.  Memory problems.  Weight gain.  Bladder infections.  Hair growth of the face and chest.  Infertility. More serious symptoms include:  Loss of bone (osteoporosis) causing breaks (fractures).  Depression.  Hardening and narrowing of the arteries (atherosclerosis) causing heart attacks and strokes. DIAGNOSIS   When the menstrual periods have stopped for 12 straight months.  Physical exam.  Hormone studies of the  blood. TREATMENT  There are many treatment choices and nearly as many questions about them. The decisions to treat or not to treat menopausal changes is an individual choice made with your health care provider. Your health care provider can discuss the treatments with you. Together, you can decide which treatment will work best for you. Your treatment choices may include:   Hormone therapy (estrogen and progesterone).  Non-hormonal medicines.  Treating the individual symptoms with medicine (for example antidepressants for depression).  Herbal medicines that may help specific symptoms.  Counseling by a psychiatrist or psychologist.  Group therapy.  Lifestyle changes including:  Eating healthy.  Regular exercise.  Limiting caffeine and alcohol.  Stress management and meditation.  No treatment. HOME CARE INSTRUCTIONS   Take the medicine your health care provider gives you as directed.  Get plenty of sleep and rest.  Exercise regularly.  Eat a diet that contains calcium (good for the bones) and soy products (acts like estrogen hormone).  Avoid alcoholic beverages.  Do not smoke.  If you have hot flashes, dress in layers.  Take supplements, calcium, and vitamin D to strengthen bones.  You can use over-the-counter lubricants or moisturizers for vaginal dryness.  Group therapy is sometimes very helpful.  Acupuncture may be helpful in some  cases. SEEK MEDICAL CARE IF:   You are not sure you are in menopause.  You are having menopausal symptoms and need advice and treatment.  You are still having menstrual periods after age 66 years.  You have pain with intercourse.  Menopause is complete (no menstrual period for 12 months) and you develop vaginal bleeding.  You need a referral to a specialist (gynecologist, psychiatrist, or psychologist) for treatment. SEEK IMMEDIATE MEDICAL CARE IF:   You have severe depression.  You have excessive vaginal bleeding.  You fell and think you have a broken bone.  You have pain when you urinate.  You develop leg or chest pain.  You have a fast pounding heart beat (palpitations).  You have severe headaches.  You develop vision problems.  You feel a lump in your breast.  You have abdominal pain or severe indigestion. Document Released: 03/22/2003 Document Revised: 09/01/2012 Document Reviewed: 07/29/2012 [facility] Patient Information 2015 [place], [place]. This information is not intended to replace advice given to you by your health care provider. Make sure you discuss any questions you have with your health care provider.

## 2013-08-17 NOTE — Progress Notes (Signed)
Heather Freeman January 03, 1966 [id number]   History:    48 y.o.  For annual exam who's only complaint is occasional migraines. Patient has not had a menstrual cycle now in one year. Patient had an [facility] in August of 2014 in the menopausal range. Patient denies any vasomotor symptoms and on no hormone replacement therapy. Review of patient's records indicated that back in 2011 she had a colposcopic directed biopsy and low-grade squamous intraepithelial lesion was noted and subsequent Pap smears have been normal. 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  Past medical history,surgical history, family history and social history were all reviewed and documented in the EPIC chart.  Gynecologic History No LMP recorded. Patient is not currently having periods (Reason: Perimenopausal). Contraception: post menopausal status Last Pap: 2014. Results were: normal Last mammogram: 2014. Results were: normal  Obstetric History OB History  Gravida Para Term Preterm AB SAB TAB Ectopic Multiple Living  2 2        2     # Outcome Date GA Lbr Len/2nd Weight Sex Delivery Anes PTL Lv  2 PAR           1 PAR                ROS: A ROS was performed and pertinent positives and negatives are included in the history.  GENERAL: No fevers or chills. HEENT: No change in vision, no earache, sore throat or sinus congestion. NECK: No pain or stiffness. CARDIOVASCULAR: No chest pain or pressure. No palpitations. PULMONARY: No shortness of breath, cough or wheeze. GASTROINTESTINAL: No abdominal pain, nausea, vomiting or diarrhea, melena or bright red blood per rectum. GENITOURINARY: No urinary frequency, urgency, hesitancy or dysuria. MUSCULOSKELETAL: No joint or muscle pain, no back pain, no recent trauma. DERMATOLOGIC: No rash, no itching, no lesions. ENDOCRINE: No polyuria, polydipsia, no heat or cold intolerance. No recent change in weight.  HEMATOLOGICAL: No anemia or easy bruising or bleeding. NEUROLOGIC: No headache, seizures, numbness, tingling or weakness. PSYCHIATRIC: No depression, no loss of interest in normal activity or change in sleep pattern.     Exam: chaperone present  BP 118/76  Ht 5\' 5"  (1.651 m)  Wt 146 lb 9.6 oz (66.497 kg)  BMI 24.40 kg/m2  Body mass index is 24.4 kg/(m^2).  General appearance : Well developed well nourished female. No acute distress HEENT: Neck supple, trachea midline, no carotid bruits, no thyroidmegaly Lungs: Clear to auscultation, no rhonchi or wheezes, or rib retractions  Heart: Regular rate and rhythm, no murmurs or gallops Breast:Examined in sitting and supine position were symmetrical in appearance, no palpable masses or tenderness,  no skin retraction, no nipple inversion, no nipple discharge, no skin discoloration, no axillary or supraclavicular lymphadenopathy Abdomen: no palpable masses or tenderness, no rebound or guarding Extremities: no edema or skin discoloration or tenderness  Pelvic:  Bartholin, Urethra, Skene Glands: Within normal limits             Vagina: No gross lesions or discharge  Cervix: No gross lesions or discharge  Uterus  anteverted, normal size, shape and consistency, non-tender and mobile  Adnexa  Without masses or tenderness  Anus and perineum  normal   Rectovaginal  normal sphincter tone without palpated masses or tenderness             Hemoccult colonoscopy will be scheduled this year     Assessment/Plan:  48 y.o.  female for annual exam menopausal with no visible or symptoms not on HRT. Pap smear not done in accordance to the new guidelines. The patient was reminded to schedule her mammogram as well as for colonoscopy. We discussed importance of calcium vitamin D and regular exercise for osteoporosis prevention. Next year we will do a bone density study. For her migraine headache she will be prescribed Imitrex 50 mg.  Note: This dictation was  prepared with  Dragon/digital dictation along withSmart phrase technology. Any transcriptional errors that result from this process are unintentional.   Ok EdwardsFERNANDEZ,JUAN H MD, 2:37 PM 08/17/2013

## 2013-09-27 ENCOUNTER — Ambulatory Visit (INDEPENDENT_AMBULATORY_CARE_PROVIDER_SITE_OTHER): Payer: BC Managed Care – PPO | Admitting: Internal Medicine

## 2013-09-27 ENCOUNTER — Encounter: Payer: Self-pay | Admitting: Internal Medicine

## 2013-09-27 VITALS — BP 120/80 | HR 72 | Temp 98.5°F | Resp 16 | Wt 147.0 lb

## 2013-09-27 DIAGNOSIS — Z23 Encounter for immunization: Secondary | ICD-10-CM

## 2013-09-27 DIAGNOSIS — F988 Other specified behavioral and emotional disorders with onset usually occurring in childhood and adolescence: Secondary | ICD-10-CM

## 2013-09-27 DIAGNOSIS — S43429A Sprain of unspecified rotator cuff capsule, initial encounter: Secondary | ICD-10-CM

## 2013-09-27 DIAGNOSIS — M75101 Unspecified rotator cuff tear or rupture of right shoulder, not specified as traumatic: Secondary | ICD-10-CM

## 2013-09-27 MED ORDER — AMPHETAMINE-DEXTROAMPHETAMINE 20 MG PO TABS: 20.0000 mg | ORAL_TABLET | Freq: Two times a day (BID) | ORAL | Status: DC

## 2013-09-27 MED ORDER — SUMATRIPTAN SUCCINATE 50 MG PO TABS: ORAL_TABLET | ORAL | Status: DC

## 2013-09-27 MED ORDER — TRIAMCINOLONE ACETONIDE 0.5 % EX CREA: 1.0000 "application " | TOPICAL_CREAM | Freq: Three times a day (TID) | CUTANEOUS | Status: DC

## 2013-09-27 NOTE — Progress Notes (Signed)
Pre visit review using our clinic review tool, if applicable. No additional management support is needed unless otherwise documented below in the visit note. 

## 2013-09-27 NOTE — Assessment & Plan Note (Signed)
Doing well 

## 2013-09-27 NOTE — Progress Notes (Signed)
   Subjective:    HPI  F/u ADD F/u cramps F/u hot flashes F/u R shoulder pain after serving a tennis ball - had surgery by Dr Ave Filter - doing great!  BP Readings from Last 3 Encounters:  09/27/13 120/80  08/17/13 118/76  06/14/13 110/74   Wt Readings from Last 3 Encounters:  09/27/13 147 lb (66.679 kg)  08/17/13 146 lb 9.6 oz (66.497 kg)  03/11/13 155 lb (70.308 kg)      Review of Systems  Constitutional: Negative for chills and diaphoresis.  Eyes: Negative for visual disturbance.  Respiratory: Negative for cough.   Cardiovascular: Negative for chest pain, palpitations and leg swelling.  Psychiatric/Behavioral: Negative for behavioral problems and sleep disturbance. The patient is not nervous/anxious.        Objective:   Physical Exam  Constitutional: She appears well-developed. No distress.  HENT:  Head: Normocephalic.  Right Ear: External ear normal.  Left Ear: External ear normal.  Nose: Nose normal.  Mouth/Throat: Oropharynx is clear and moist.  Eyes: Conjunctivae are normal. Pupils are equal, round, and reactive to light. Right eye exhibits no discharge. Left eye exhibits no discharge.  Neck: Normal range of motion. Neck supple. No JVD present. No tracheal deviation present. No thyromegaly present.  Cardiovascular: Normal rate, regular rhythm and normal heart sounds.   Pulmonary/Chest: No stridor. No respiratory distress. She has no wheezes.  Abdominal: Soft. Bowel sounds are normal. She exhibits no distension and no mass. There is no tenderness. There is no rebound and no guarding.  Musculoskeletal: She exhibits no edema and no tenderness.  Lymphadenopathy:    She has no cervical adenopathy.  Neurological: She displays normal reflexes. No cranial nerve deficit. She exhibits normal muscle tone. Coordination normal.  Skin: No rash noted. No erythema.  Psychiatric: She has a normal mood and affect. Her behavior is normal. Judgment and thought content normal.   R shoulder is not tender   Lab Results  Component Value Date   WBC 7.5 11/09/2012   HGB 13.7 11/09/2012   HCT 39.4 11/09/2012   PLT 383.0 11/09/2012   GLUCOSE 99 11/09/2012   CHOL 210* 11/09/2012   TRIG 38.0 11/09/2012   HDL 99.10 11/09/2012   LDLDIRECT 101.3 11/09/2012   LDLCALC 98 04/23/2011   ALT 16 11/09/2012   AST 26 11/09/2012   NA 136 11/09/2012   K 3.7 11/09/2012   CL 100 11/09/2012   CREATININE 0.7 11/09/2012   BUN 11 11/09/2012   CO2 29 11/09/2012   TSH 1.73 11/09/2012         Assessment & Plan:

## 2013-09-27 NOTE — Assessment & Plan Note (Signed)
Continue with current prescription therapy as reflected on the Med list.  

## 2013-10-04 LAB — COLOGUARD: Cologuard: NEGATIVE

## 2013-10-19 ENCOUNTER — Encounter: Payer: Self-pay | Admitting: Internal Medicine

## 2013-11-14 ENCOUNTER — Encounter: Payer: Self-pay | Admitting: Internal Medicine

## 2013-12-02 ENCOUNTER — Other Ambulatory Visit: Payer: Self-pay

## 2013-12-02 DIAGNOSIS — Z1231 Encounter for screening mammogram for malignant neoplasm of breast: Secondary | ICD-10-CM

## 2013-12-12 ENCOUNTER — Encounter: Payer: Self-pay | Admitting: Internal Medicine

## 2013-12-12 ENCOUNTER — Ambulatory Visit (INDEPENDENT_AMBULATORY_CARE_PROVIDER_SITE_OTHER): Payer: BC Managed Care – PPO | Admitting: Internal Medicine

## 2013-12-12 VITALS — BP 130/84 | HR 85 | Temp 98.1°F | Wt 145.0 lb

## 2013-12-12 DIAGNOSIS — F909 Attention-deficit hyperactivity disorder, unspecified type: Secondary | ICD-10-CM

## 2013-12-12 DIAGNOSIS — F988 Other specified behavioral and emotional disorders with onset usually occurring in childhood and adolescence: Secondary | ICD-10-CM

## 2013-12-12 DIAGNOSIS — M7021 Olecranon bursitis, right elbow: Secondary | ICD-10-CM

## 2013-12-12 DIAGNOSIS — M7030 Other bursitis of elbow, unspecified elbow: Secondary | ICD-10-CM | POA: Insufficient documentation

## 2013-12-12 MED ORDER — METHYLPREDNISOLONE ACETATE 20 MG/ML IJ SUSP
10.0000 mg | Freq: Once | INTRAMUSCULAR | Status: AC
  Administered 2013-12-12: 10 mg via INTRA_ARTICULAR

## 2013-12-12 MED ORDER — AMPHETAMINE-DEXTROAMPHETAMINE 20 MG PO TABS: 20.0000 mg | ORAL_TABLET | Freq: Two times a day (BID) | ORAL | Status: DC

## 2013-12-12 NOTE — Patient Instructions (Addendum)
Postprocedure instructions :    A Band-Aid should be left on for 12 hours. Injection therapy is not a cure itself. It is used in conjunction with other modalities. You can use nonsteroidal anti-inflammatories like ibuprofen , hot and cold compresses. Rest is recommended in the next 24 hours. You need to report immediately  if fever, chills or any signs of infection develop.  Aleve 1 twice a day x 1-2 weeks

## 2013-12-12 NOTE — Progress Notes (Signed)
Subjective:    HPI  C/o R elbow swelling x 2 weeks  F/u ADD F/u cramps F/u hot flashes F/u R shoulder pain after serving a tennis ball - had surgery by Dr Ave Filterhandler - doing great!  BP Readings from Last 3 Encounters:  12/12/13 130/84  09/27/13 120/80  08/17/13 118/76   Wt Readings from Last 3 Encounters:  12/12/13 145 lb (65.772 kg)  09/27/13 147 lb (66.679 kg)  08/17/13 146 lb 9.6 oz (66.497 kg)      Review of Systems  Constitutional: Negative for chills and diaphoresis.  Eyes: Negative for visual disturbance.  Respiratory: Negative for cough.   Cardiovascular: Negative for chest pain, palpitations and leg swelling.  Psychiatric/Behavioral: Negative for behavioral problems and sleep disturbance. The patient is not nervous/anxious.        Objective:   Physical Exam  Constitutional: She appears well-developed. No distress.  HENT:  Head: Normocephalic.  Right Ear: External ear normal.  Left Ear: External ear normal.  Nose: Nose normal.  Mouth/Throat: Oropharynx is clear and moist.  Eyes: Conjunctivae are normal. Pupils are equal, round, and reactive to light. Right eye exhibits no discharge. Left eye exhibits no discharge.  Neck: Normal range of motion. Neck supple. No JVD present. No tracheal deviation present. No thyromegaly present.  Cardiovascular: Normal rate, regular rhythm and normal heart sounds.   Pulmonary/Chest: No stridor. No respiratory distress. She has no wheezes.  Abdominal: Soft. Bowel sounds are normal. She exhibits no distension and no mass. There is no tenderness. There is no rebound and no guarding.  Musculoskeletal: She exhibits no edema or tenderness.  Lymphadenopathy:    She has no cervical adenopathy.  Neurological: She displays normal reflexes. No cranial nerve deficit. She exhibits normal muscle tone. Coordination normal.  Skin: No rash noted. No erythema.  Psychiatric: She has a normal mood and affect. Her behavior is normal. Judgment  and thought content normal.   R elbow w/bursitis Rash - bug bites R shoulder is not tender   Lab Results  Component Value Date   WBC 7.5 11/09/2012   HGB 13.7 11/09/2012   HCT 39.4 11/09/2012   PLT 383.0 11/09/2012   GLUCOSE 99 11/09/2012   CHOL 210* 11/09/2012   TRIG 38.0 11/09/2012   HDL 99.10 11/09/2012   LDLDIRECT 101.3 11/09/2012   LDLCALC 98 04/23/2011   ALT 16 11/09/2012   AST 26 11/09/2012   NA 136 11/09/2012   K 3.7 11/09/2012   CL 100 11/09/2012   CREATININE 0.7 11/09/2012   BUN 11 11/09/2012   CO2 29 11/09/2012   TSH 1.73 11/09/2012   Options to treat were discussed. She elected aspiration/steroids   Procedure Note :    Procedure : Elbow  bursa aspiration and steroid injection R   Indication: Bursitis with refractory  chronic pain R.   Risks including unsuccessful procedure , bleeding, infection, bruising, skin atrophy and others were explained to the patient in detail as well as the benefits. Informed consent was obtained and signed.   Tthe patient was placed in a comfortable position. Skin was prepped with Betadine and alcohol  and anesthetized with a cooling spray. Then, a 3 cc syringe with a 1 inch long 25-gauge needle was used for a skin over bursa injection 1 mL of 2% lidocaine. 21 gauge 1 inch needle on a 10 ml syringe was used to aspirate 10 ml of reddish sero-sanguinous fluid (discarded). Then, 20 mg of Depo-Medrol and 1 cc Lido was injected in  the bursa .  Band-Aid was applied. ACE wrap.   Tolerated well. Complications: None. Good pain relief following the procedure.           Assessment & Plan:

## 2013-12-12 NOTE — Assessment & Plan Note (Signed)
See Procedure 

## 2013-12-12 NOTE — Progress Notes (Signed)
Pre visit review using our clinic review tool, if applicable. No additional management support is needed unless otherwise documented below in the visit note. 

## 2013-12-13 NOTE — Assessment & Plan Note (Addendum)
Continue with current prescription therapy as reflected on the Med list.  Potential benefits of a long term amphetamines  use as well as potential risks  and complications were explained to the patient and were aknowledged.  

## 2013-12-26 ENCOUNTER — Ambulatory Visit
Admission: RE | Admit: 2013-12-26 | Discharge: 2013-12-26 | Disposition: A | Discharge status: Home / Self Care | Payer: BC Managed Care – PPO | Source: Ambulatory Visit

## 2013-12-26 ENCOUNTER — Encounter: Payer: Self-pay | Admitting: Internal Medicine

## 2013-12-26 DIAGNOSIS — Z1231 Encounter for screening mammogram for malignant neoplasm of breast: Secondary | ICD-10-CM

## 2013-12-27 ENCOUNTER — Ambulatory Visit: Payer: BC Managed Care – PPO | Admitting: Internal Medicine

## 2014-02-08 ENCOUNTER — Encounter: Payer: Self-pay | Admitting: Internal Medicine

## 2014-02-08 ENCOUNTER — Other Ambulatory Visit: Payer: Self-pay | Admitting: Internal Medicine

## 2014-02-10 NOTE — Telephone Encounter (Signed)
PA for adderral was started and denied.  Calling plan to appeal and to confirm drug name.  Appeal approved. Sent pt message via my chart.

## 2014-04-24 ENCOUNTER — Ambulatory Visit (INDEPENDENT_AMBULATORY_CARE_PROVIDER_SITE_OTHER): Payer: 59 | Admitting: Internal Medicine

## 2014-04-24 ENCOUNTER — Telehealth: Payer: Self-pay | Admitting: Internal Medicine

## 2014-04-24 ENCOUNTER — Other Ambulatory Visit: Payer: Self-pay | Admitting: Internal Medicine

## 2014-04-24 ENCOUNTER — Encounter: Payer: Self-pay | Admitting: Internal Medicine

## 2014-04-24 VITALS — BP 110/82 | HR 88 | Wt 152.0 lb

## 2014-04-24 DIAGNOSIS — F909 Attention-deficit hyperactivity disorder, unspecified type: Secondary | ICD-10-CM

## 2014-04-24 DIAGNOSIS — R21 Rash and other nonspecific skin eruption: Secondary | ICD-10-CM

## 2014-04-24 DIAGNOSIS — M7021 Olecranon bursitis, right elbow: Secondary | ICD-10-CM

## 2014-04-24 DIAGNOSIS — F988 Other specified behavioral and emotional disorders with onset usually occurring in childhood and adolescence: Secondary | ICD-10-CM

## 2014-04-24 MED ORDER — AMPHETAMINE-DEXTROAMPHETAMINE 20 MG PO TABS: 20.0000 mg | ORAL_TABLET | Freq: Two times a day (BID) | ORAL | Status: DC

## 2014-04-24 MED ORDER — SUMATRIPTAN SUCCINATE 50 MG PO TABS: ORAL_TABLET | ORAL | Status: DC

## 2014-04-24 MED ORDER — TRIAMCINOLONE ACETONIDE 0.5 % EX CREA
1.0000 | TOPICAL_CREAM | Freq: Three times a day (TID) | CUTANEOUS | Status: DC
Start: 2014-04-24 — End: 2014-07-26

## 2014-04-24 NOTE — Progress Notes (Signed)
Pre visit review using our clinic review tool, if applicable. No additional management support is needed unless otherwise documented below in the visit note. 

## 2014-04-24 NOTE — Assessment & Plan Note (Signed)
Resolved

## 2014-04-24 NOTE — Telephone Encounter (Signed)
Please call patient. She didn't receive this months prescription for amphetamine-dextroamphetamine (ADDERALL) 20 MG tablet [[id number]][[id number]].

## 2014-04-24 NOTE — Telephone Encounter (Signed)
Did you give her 3 Rxs this morning?

## 2014-04-24 NOTE — Assessment & Plan Note (Signed)
Adderall

## 2014-04-24 NOTE — Telephone Encounter (Signed)
There was 2 on 1 on the first page. Pls re-issue if Olegario MessierKathy doesn't have it Thx

## 2014-04-24 NOTE — Progress Notes (Signed)
   Subjective:    HPI  F/u ADD F/u cramps F/u hot flashes F/u R shoulder pain after serving a tennis ball - had surgery by Dr Ave Filterhandler - doing great!  BP Readings from Last 3 Encounters:  04/24/14 110/82  12/12/13 130/84  09/27/13 120/80   Wt Readings from Last 3 Encounters:  04/24/14 152 lb (68.947 kg)  12/12/13 145 lb (65.772 kg)  09/27/13 147 lb (66.679 kg)      Review of Systems  Constitutional: Negative for chills and diaphoresis.  Eyes: Negative for visual disturbance.  Respiratory: Negative for cough.   Cardiovascular: Negative for chest pain, palpitations and leg swelling.  Psychiatric/Behavioral: Negative for behavioral problems and sleep disturbance. The patient is not nervous/anxious.        Objective:   Physical Exam  Constitutional: She appears well-developed. No distress.  HENT:  Head: Normocephalic.  Right Ear: External ear normal.  Left Ear: External ear normal.  Nose: Nose normal.  Mouth/Throat: Oropharynx is clear and moist.  Eyes: Conjunctivae are normal. Pupils are equal, round, and reactive to light. Right eye exhibits no discharge. Left eye exhibits no discharge.  Neck: Normal range of motion. Neck supple. No JVD present. No tracheal deviation present. No thyromegaly present.  Cardiovascular: Normal rate, regular rhythm and normal heart sounds.   Pulmonary/Chest: No stridor. No respiratory distress. She has no wheezes.  Abdominal: Soft. Bowel sounds are normal. She exhibits no distension and no mass. There is no tenderness. There is no rebound and no guarding.  Musculoskeletal: She exhibits no edema or tenderness.  Lymphadenopathy:    She has no cervical adenopathy.  Neurological: She displays normal reflexes. No cranial nerve deficit. She exhibits normal muscle tone. Coordination normal.  Skin: No rash noted. No erythema.  Psychiatric: She has a normal mood and affect. Her behavior is normal. Judgment and thought content normal.   R  shoulder is not tender   Lab Results  Component Value Date   WBC 7.5 11/09/2012   HGB 13.7 11/09/2012   HCT 39.4 11/09/2012   PLT 383.0 11/09/2012   GLUCOSE 99 11/09/2012   CHOL 210* 11/09/2012   TRIG 38.0 11/09/2012   HDL 99.10 11/09/2012   LDLDIRECT 101.3 11/09/2012   LDLCALC 98 04/23/2011   ALT 16 11/09/2012   AST 26 11/09/2012   NA 136 11/09/2012   K 3.7 11/09/2012   CL 100 11/09/2012   CREATININE 0.7 11/09/2012   BUN 11 11/09/2012   CO2 29 11/09/2012   TSH 1.73 11/09/2012       Assessment & Plan:

## 2014-04-25 ENCOUNTER — Other Ambulatory Visit: Payer: Self-pay | Admitting: Internal Medicine

## 2014-04-25 DIAGNOSIS — F988 Other specified behavioral and emotional disorders with onset usually occurring in childhood and adolescence: Secondary | ICD-10-CM

## 2014-04-25 MED ORDER — AMPHETAMINE-DEXTROAMPHETAMINE 20 MG PO TABS: 20.0000 mg | ORAL_TABLET | Freq: Two times a day (BID) | ORAL | Status: DC

## 2014-04-25 NOTE — Telephone Encounter (Signed)
Called pt ask her md response below she stated that he gave her 3 rx's but they are dated to fill on May 11th, June 11th, and July 11th. She stated that she need one to fill yesterday for April. Inform pt md printed another rx but will have to change because he stated to fill on 05/09/10. Reprinted script for April inform pt she can pick up today...Heather Freeman/lmb

## 2014-07-26 ENCOUNTER — Encounter: Payer: Self-pay | Admitting: Internal Medicine

## 2014-07-26 ENCOUNTER — Ambulatory Visit (INDEPENDENT_AMBULATORY_CARE_PROVIDER_SITE_OTHER): Payer: 59 | Admitting: Internal Medicine

## 2014-07-26 VITALS — BP 110/68 | HR 85 | Wt 149.0 lb

## 2014-07-26 DIAGNOSIS — N946 Dysmenorrhea, unspecified: Secondary | ICD-10-CM | POA: Diagnosis not present

## 2014-07-26 DIAGNOSIS — Z0000 Encounter for general adult medical examination without abnormal findings: Secondary | ICD-10-CM | POA: Diagnosis not present

## 2014-07-26 DIAGNOSIS — R21 Rash and other nonspecific skin eruption: Secondary | ICD-10-CM | POA: Diagnosis not present

## 2014-07-26 DIAGNOSIS — F909 Attention-deficit hyperactivity disorder, unspecified type: Secondary | ICD-10-CM | POA: Diagnosis not present

## 2014-07-26 DIAGNOSIS — F988 Other specified behavioral and emotional disorders with onset usually occurring in childhood and adolescence: Secondary | ICD-10-CM

## 2014-07-26 MED ORDER — AMPHETAMINE-DEXTROAMPHETAMINE 20 MG PO TABS: 20.0000 mg | ORAL_TABLET | Freq: Two times a day (BID) | ORAL | Status: DC

## 2014-07-26 MED ORDER — TRIAMCINOLONE ACETONIDE 0.5 % EX CREA
1.0000 | TOPICAL_CREAM | Freq: Three times a day (TID) | CUTANEOUS | Status: DC
Start: 2014-07-26 — End: 2015-01-30

## 2014-07-26 MED ORDER — FLUOXETINE HCL 20 MG PO CAPS: 20.0000 mg | ORAL_CAPSULE | Freq: Every day | ORAL | Status: DC

## 2014-07-26 MED ORDER — MONTELUKAST SODIUM 10 MG PO TABS: 10.0000 mg | ORAL_TABLET | Freq: Every day | ORAL | Status: DC

## 2014-07-26 NOTE — Assessment & Plan Note (Signed)
Kenalog

## 2014-07-26 NOTE — Progress Notes (Signed)
Pre visit review using our clinic review tool, if applicable. No additional management support is needed unless otherwise documented below in the visit note. 

## 2014-07-26 NOTE — Progress Notes (Signed)
Subjective:  Patient ID: Heather Freeman, female    DOB: 07/20/1965  Age: 49 y.o. MRN: [id number]  CC: No chief complaint on file.   HPI NIKIE CID presents for ADD    Outpatient Prescriptions Prior to Visit  Medication Sig Dispense Refill  . Cholecalciferol (VITAMIN D3) 1000 UNITS tablet Take 1,000 Units by mouth daily.      . clindamycin (CLEOCIN T) 1 % lotion Apply 1 application topically 2 (two) times daily. On face.     . loratadine (CLARITIN) 10 MG tablet Take 10 mg by mouth daily as needed. For allergies.     . nitroGLYCERIN (NITRODUR - DOSED IN MG/24 HR) 0.2 mg/hr patch 1/4 patch daily 30 patch 1  . SUMAtriptan (IMITREX) 50 MG tablet Take one tablet at onset of headache may repeat a second dose in 2 hours. Not to exceed 2 tablets in 24 hours 10 tablet 3  . traMADol (ULTRAM) 50 MG tablet Take 1-2 tablets (50-100 mg total) by mouth 2 (two) times daily as needed. 100 tablet 2  . amphetamine-dextroamphetamine (ADDERALL) 20 MG tablet Take 1 tablet (20 mg total) by mouth 2 (two) times daily. Please fill on or after 07/24/14 60 tablet 0  . amphetamine-dextroamphetamine (ADDERALL) 20 MG tablet Take 1 tablet (20 mg total) by mouth 2 (two) times daily with breakfast and lunch. 60 tablet 0  . FLUoxetine (PROZAC) 20 MG capsule Take 1 capsule (20 mg total) by mouth daily. 100 capsule 3  . montelukast (SINGULAIR) 10 MG tablet TAKE ONE TABLET BY MOUTH EVERY DAY 90 tablet 1  . triamcinolone cream (KENALOG) 0.5 % Apply 1 application topically 3 (three) times daily. To affected area. 60 g 2   No facility-administered medications prior to visit.    ROS Review of Systems  Constitutional: Negative for chills, activity change, appetite change, fatigue and unexpected weight change.  HENT: Negative for congestion, mouth sores and sinus pressure.   Eyes: Negative for visual disturbance.  Respiratory: Negative for cough and chest tightness.   Gastrointestinal: Negative for nausea and  abdominal pain.  Genitourinary: Negative for dysuria, frequency, difficulty urinating and vaginal pain.  Musculoskeletal: Negative for back pain and gait problem.  Skin: Negative for pallor and rash.  Neurological: Negative for dizziness, tremors, weakness, numbness and headaches.  Psychiatric/Behavioral: Negative for suicidal ideas, confusion, sleep disturbance, self-injury and decreased concentration. The patient is not nervous/anxious.     Objective:  BP 110/68 mmHg  Pulse 85  Wt 149 lb (67.586 kg)  SpO2 98%  BP Readings from Last 3 Encounters:  07/26/14 110/68  04/24/14 110/82  12/12/13 130/84    Wt Readings from Last 3 Encounters:  07/26/14 149 lb (67.586 kg)  04/24/14 152 lb (68.947 kg)  12/12/13 145 lb (65.772 kg)    Physical Exam  Constitutional: She appears well-developed. No distress.  HENT:  Head: Normocephalic.  Right Ear: External ear normal.  Left Ear: External ear normal.  Nose: Nose normal.  Mouth/Throat: Oropharynx is clear and moist.  Eyes: Conjunctivae are normal. Pupils are equal, round, and reactive to light. Right eye exhibits no discharge. Left eye exhibits no discharge.  Neck: Normal range of motion. Neck supple. No JVD present. No tracheal deviation present. No thyromegaly present.  Cardiovascular: Normal rate, regular rhythm and normal heart sounds.   Pulmonary/Chest: No stridor. No respiratory distress. She has no wheezes.  Abdominal: Soft. Bowel sounds are normal. She exhibits no distension and no mass. There is no rebound and no  guarding.  Musculoskeletal: She exhibits no edema or tenderness.  Lymphadenopathy:    She has no cervical adenopathy.  Neurological: She displays normal reflexes. No cranial nerve deficit. She exhibits normal muscle tone.  Skin: No rash noted. No erythema.  Psychiatric: She has a normal mood and affect. Her behavior is normal. Judgment and thought content normal.    Lab Results  Component Value Date   WBC 7.5  11/09/2012   HGB 13.7 11/09/2012   HCT 39.4 11/09/2012   PLT 383.0 11/09/2012   GLUCOSE 99 11/09/2012   CHOL 210* 11/09/2012   TRIG 38.0 11/09/2012   HDL 99.10 11/09/2012   LDLDIRECT 101.3 11/09/2012   LDLCALC 98 04/23/2011   ALT 16 11/09/2012   AST 26 11/09/2012   NA 136 11/09/2012   K 3.7 11/09/2012   CL 100 11/09/2012   CREATININE 0.7 11/09/2012   BUN 11 11/09/2012   CO2 29 11/09/2012   TSH 1.73 11/09/2012    Mm Screening Breast Tomo Bilateral  12/26/2013   CLINICAL DATA:  Screening.  EXAM: DIGITAL SCREENING BILATERAL MAMMOGRAM WITH 3D TOMO WITH CAD  COMPARISON:  Previous exam(s).  ACR Breast Density Category c: The breast tissue is heterogeneously dense, which may obscure small masses.  FINDINGS: There are no findings suspicious for malignancy. Images were processed with CAD.  IMPRESSION: No mammographic evidence of malignancy. A result letter of this screening mammogram will be mailed directly to the patient.  RECOMMENDATION: Screening mammogram in one year. (Code:SM-B-01Y)  BI-RADS CATEGORY  1: Negative.   Electronically Signed   By: Baird Lyonsina  Arceo M.D.   On: 12/26/2013 11:20    Assessment & Plan:   Diagnoses and all orders for this visit:  Menstrual cramps Orders: -     FLUoxetine (PROZAC) 20 MG capsule; Take 1 capsule (20 mg total) by mouth daily.  Attention deficit disorder  Rash  Other orders -     amphetamine-dextroamphetamine (ADDERALL) 20 MG tablet; Take 1 tablet (20 mg total) by mouth 2 (two) times daily. Please fill on or after 08/24/14 -     montelukast (SINGULAIR) 10 MG tablet; Take 1 tablet (10 mg total) by mouth daily. -     triamcinolone cream (KENALOG) 0.5 %; Apply 1 application topically 3 (three) times daily. To affected area.   I have discontinued Ms. Borre's amphetamine-dextroamphetamine. I have also changed her amphetamine-dextroamphetamine and montelukast. Additionally, I am having her maintain her clindamycin, loratadine, cholecalciferol,  nitroGLYCERIN, traMADol, SUMAtriptan, FLUoxetine, and triamcinolone cream.  Meds ordered this encounter  Medications  . amphetamine-dextroamphetamine (ADDERALL) 20 MG tablet    Sig: Take 1 tablet (20 mg total) by mouth 2 (two) times daily. Please fill on or after 08/24/14    Dispense:  60 tablet    Refill:  0  . FLUoxetine (PROZAC) 20 MG capsule    Sig: Take 1 capsule (20 mg total) by mouth daily.    Dispense:  100 capsule    Refill:  3  . montelukast (SINGULAIR) 10 MG tablet    Sig: Take 1 tablet (10 mg total) by mouth daily.    Dispense:  90 tablet    Refill:  3  . triamcinolone cream (KENALOG) 0.5 %    Sig: Apply 1 application topically 3 (three) times daily. To affected area.    Dispense:  60 g    Refill:  2     Follow-up: No Follow-up on file.  Sonda PrimesAlex Plotnikov, MD

## 2014-07-26 NOTE — Assessment & Plan Note (Signed)
Potential benefits of a long term amphetamines use as well as potential risks  and complications were explained to the patient and were aknowledged.

## 2014-08-21 ENCOUNTER — Encounter: Payer: Self-pay | Admitting: Gynecology

## 2014-08-21 ENCOUNTER — Ambulatory Visit (INDEPENDENT_AMBULATORY_CARE_PROVIDER_SITE_OTHER): Payer: 59 | Admitting: Gynecology

## 2014-08-21 VITALS — BP 138/88 | Ht 64.5 in | Wt 149.0 lb

## 2014-08-21 DIAGNOSIS — Z01419 Encounter for gynecological examination (general) (routine) without abnormal findings: Secondary | ICD-10-CM | POA: Diagnosis not present

## 2014-08-21 DIAGNOSIS — Z79890 Hormone replacement therapy (postmenopausal): Secondary | ICD-10-CM

## 2014-08-21 DIAGNOSIS — Z780 Asymptomatic menopausal state: Secondary | ICD-10-CM

## 2014-08-21 MED ORDER — ESTRADIOL 0.52 MG/0.87 GM (0.06%) TD GEL: 1.0000 "application " | Freq: Every evening | TRANSDERMAL | Status: DC | PRN

## 2014-08-21 MED ORDER — PROGESTERONE MICRONIZED 200 MG PO CAPS: 200.0000 mg | ORAL_CAPSULE | Freq: Every day | ORAL | Status: DC

## 2014-08-21 NOTE — Progress Notes (Signed)
Heather Freeman 09-30-1965 [id number]   History:    49 y.o.  for annual gyn exam with complaints of hot flashes, irritability, mood swing, vaginal dryness and decreased libido. Patient has not had a menstrual cycle in 2 years. She was seen last year and had minimal vasomotor symptoms. She had an [facility] in 2014 that was in the menopausal range. She is rated start hormone replacement therapy now.Review of patient's records indicated that back in 2011 she had a colposcopic directed biopsy and low-grade squamous intraepithelial lesion was noted and subsequent Pap smears have been normal. 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   Past medical history,surgical history, family history and social history were all reviewed and documented in the EPIC chart.  Gynecologic History No LMP recorded. Patient is not currently having periods (Reason: Perimenopausal). Contraception: post menopausal status Last Pap: 2014. Results were: normal Last mammogram: 2015. Results were: Normal three-dimensional mammogram  Obstetric History OB History  Gravida Para Term Preterm AB SAB TAB Ectopic Multiple Living  2 2        2     # Outcome Date GA Lbr Len/2nd Weight Sex Delivery Anes PTL Lv  2 Para           1 Para                ROS: A ROS was performed and pertinent positives and negatives are included in the history.  GENERAL: No fevers or chills. HEENT: No change in vision, no earache, sore throat or sinus congestion. NECK: No pain or stiffness. CARDIOVASCULAR: No chest pain or pressure. No palpitations. PULMONARY: No shortness of breath, cough or wheeze. GASTROINTESTINAL: No abdominal pain, nausea, vomiting or diarrhea, melena or bright red blood per rectum. GENITOURINARY: No urinary frequency, urgency, hesitancy or dysuria. MUSCULOSKELETAL: No joint or muscle pain, no back pain, no recent trauma. DERMATOLOGIC: No rash, no itching,  no lesions. ENDOCRINE: No polyuria, polydipsia, no heat or cold intolerance. No recent change in weight. HEMATOLOGICAL: No anemia or easy bruising or bleeding. NEUROLOGIC: No headache, seizures, numbness, tingling or weakness. PSYCHIATRIC: No depression, no loss of interest in normal activity or change in sleep pattern.     Exam: chaperone present  BP 138/88 mmHg  Ht 5' 4.5" (1.638 m)  Wt 149 lb (67.586 kg)  BMI 25.19 kg/m2  Body mass index is 25.19 kg/(m^2).  General appearance : Well developed well nourished female. No acute distress HEENT: Eyes: no retinal hemorrhage or exudates,  Neck supple, trachea midline, no carotid bruits, no thyroidmegaly Lungs: Clear to auscultation, no rhonchi or wheezes, or rib retractions  Heart: Regular rate and rhythm, no murmurs or gallops Breast:Examined in sitting and supine position were symmetrical in appearance, no palpable masses or tenderness,  no skin retraction, no nipple inversion, no nipple discharge, no skin discoloration, no axillary or supraclavicular lymphadenopathy Abdomen: no palpable masses or tenderness, no rebound or guarding Extremities: no edema or skin discoloration or tenderness  Pelvic:  Bartholin, Urethra, Skene Glands: Within normal limits             Vagina: No gross lesions or discharge  Cervix: No gross lesions or discharge  Uterus  anteverted, normal size, shape and consistency, non-tender and mobile  Adnexa  Without masses or tenderness  Anus and perineum  normal   Rectovaginal  normal sphincter tone without palpated masses or tenderness  Hemoccult PCP provides     Assessment/Plan:  49 y.o. female for annual exam with signs and symptoms consisting of the menopause. Patient had an FSH in the menopausal range 2 years ago. Patient rated start hormone replacement therapy. We discussed different treatment options and dosages. She will be started on Elestrin 0.06% transdermal gel to apply to one arm only each  night. She will then take Prometrium 200 mg daily at bedtime for 12 days of the month. The risks benefits and pros and cons of hormone replacement therapy to include DVT and pulmonary embolism was discussed. We also discussed the women's health initiative study. Literature information on hormonal replacement therapy was provided as well. We discussed importance of calcium and vitamin D and regular exercise for osteoporosis prevention. She will schedule a bone density study here in the office in the next few weeks. Her PCP we'll be doing her blood work. Pap smear not done today in accordance to the new guidelines.   Ok Edwards MD, 3:09 PM 08/21/2014

## 2014-08-21 NOTE — Patient Instructions (Addendum)
Progesterone capsules What is this medicine? PROGESTERONE (proe JES ter one) is a female hormone. This medicine is used to prevent the overgrowth of the lining of the uterus in women who are taking estrogens for the symptoms of menopause. It is also used to treat secondary amenorrhea. This is when a woman stops getting menstrual periods due to low levels of progesterone. This medicine may be used for other purposes; ask your health care provider or pharmacist if you have questions. COMMON BRAND NAME(S): Prometrium What should I tell my health care provider before I take this medicine? They need to know if you have any of these conditions: -autoimmune disease like systemic lupus erythematosus (SLE) -blood vessel disease, blood clotting disorder, or suffered a stroke -breast, cervical or vaginal cancer -dementia -diabetes -kidney or liver disease -heart disease, high blood pressure or recent heart attack -high blood lipids or cholesterol -hysterectomy -recent miscarriage -tobacco smoker -vaginal bleeding -an unusual or allergic reaction to progesterone, peanuts, other medicines, foods, dyes, or preservatives -pregnant or trying to get pregnant -breast-feeding How should I use this medicine? Take this medicine by mouth with a glass of water. Follow the directions on the prescription label. Take your doses at regular intervals. Do not take your medicine more often than directed. Talk to your pediatrician regarding the use of this medicine in children. Special care may be needed. A patient package insert for the product will be given with each prescription and refill. Read this sheet carefully each time. The sheet may change frequently. Overdosage: If you think you have taken too much of this medicine contact a poison control center or emergency room at once. NOTE: This medicine is only for you. Do not share this medicine with others. What if I miss a dose? If you miss a dose, take it as soon  as you can. If it is almost time for your next dose, take only that dose. Do not take double or extra doses. What may interact with this medicine? Do not take this medicine with any of the following medications: -bosentan This medicine may also interact with the following medications: -barbiturate medicines for sleep or seizures -bexarotene -carbamazepine -ethotoin -ketoconazole -phenytoin -rifampin This list may not describe all possible interactions. Give your health care provider a list of all the medicines, herbs, non-prescription drugs, or dietary supplements you use. Also tell them if you smoke, drink alcohol, or use illegal drugs. Some items may interact with your medicine. What should I watch for while using this medicine? Visit your doctor or health care professional for regular checks on your progress. This medicine can cause swelling, tenderness, or bleeding of the gums. Be careful when brushing and flossing teeth. See your dentist regularly for routine dental care. You may get drowsy or dizzy. Do not drive, use machinery, or do anything that needs mental alertness until you know how this drug affects you. Do not stand or sit up quickly, especially if you are an older patient. This reduces the risk of dizzy or fainting spells. What side effects may I notice from receiving this medicine? Side effects that you should report to your doctor or health care professional as soon as possible: -allergic reactions like skin rash, itching or hives, swelling of the face, lips, or tongue -breast tissue changes or discharge -changes in vaginal bleeding during your period or between your periods -depression -muscle or bone pain -numbness or pain in the arm or leg -pain in the chest, groin or leg -seizures or tremors -severe  headache -stomach pain -sudden shortness of breath -unusually weak or tired -vision or speech problems -yellowing of skin or eyes Side effects that usually do not  require medical attention (report to your doctor or health care professional if they continue or are bothersome): -acne -fluid retention and swelling -increased in appetite -mood changes, anxiety, depression, frustration, anger, or emotional outbursts -nausea, vomiting -sweating or hot flashes This list may not describe all possible side effects. Call your doctor for medical advice about side effects. You may report side effects to FDA at 1-800-FDA-1088. Where should I keep my medicine? Keep out of the reach of children. Store at room temperature between 15 and 30 degrees C (59 and 86 degrees F). Protect from light. Keep container tightly closed. Throw away any unused medicine after the expiration date. NOTE: This sheet is a summary. It may not cover all possible information. If you have questions about this medicine, talk to your doctor, pharmacist, or health care provider.  2015, Elsevier/Gold Standard. (2007-12-16 11:43:48) Estradiol topical gel What is this medicine? ESTRADIOL (es tra DYE ole) contains the female hormone estrogen. It is used for symptoms of menopause like hot flashes, night sweats, and mood changes. This medicine may also help relieve other symptoms like vaginal dryness, itching and increased or painful urination. This medicine may be used for other purposes; ask your health care provider or pharmacist if you have questions. COMMON BRAND NAME(S): Divigel, Elestrin, EstroGel What should I tell my health care provider before I take this medicine? They need to know if you have any of these conditions: -abnormal vaginal bleeding -blood vessel disease or blood clots -breast, cervical, endometrial, ovarian, liver, or uterine cancer -dementia -diabetes -gallbladder disease -heart disease or recent heart attack -high blood pressure -high cholesterol -high level of calcium in the blood -hysterectomy -kidney disease -liver disease -migraine headaches -protein C  deficiency -protein S deficiency -stroke -systemic lupus erythematosus (SLE) -tobacco smoker -an unusual or allergic reaction to estrogens, other hormones, soy, other medicines, foods, dyes, or preservatives -pregnant or trying to get pregnant -breast-feeding How should I use this medicine? This medicine is for external use only. Follow the directions that come with your prescription. Spread the gel into a thin layer. It is not necessary to rub or massage the gel into the skin. Wash your hands with soap and water after applying the gel. Allow the gel to dry for up to 5 minutes before dressing. Avoid fire, flame or smoking until gel has dried. Do not apply to the breast, face or in or around the vagina. Do not use your medicine more often than directed. A patient package insert for the product will be given with each prescription and refill. Read this sheet carefully each time. The sheet may change frequently. Talk to your pediatrician regarding the use of this medicine in children. Special care may be needed. Overdosage: If you think you have taken too much of this medicine contact a poison control center or emergency room at once. NOTE: This medicine is only for you. Do not share this medicine with others. What if I miss a dose? If you miss a dose, use it as soon as you can. If it is almost time for your next dose, use only that dose. Do not use double or extra doses. What may interact with this medicine? Do not take this medicine with any of the following medications: -aromatase inhibitors like aminoglutethimide, anastrozole, exemestane, letrozole, testolactone This medicine may also interact with the following medications: -  antibiotics used to treat tuberculosis like rifabutin, rifampin and rifapentine -raloxifene or tamoxifen -warfarin This list may not describe all possible interactions. Give your health care provider a list of all the medicines, herbs, non-prescription drugs, or dietary  supplements you use. Also tell them if you smoke, drink alcohol, or use illegal drugs. Some items may interact with your medicine. What should I watch for while using this medicine? Visit your health care professional for regular checks on your progress. You will need a regular breast and pelvic exam. You should also discuss the need for regular mammograms with your health care professional, and follow his or her guidelines. This medicine can make your body retain fluid, making your fingers, hands, or ankles swell. Your blood pressure can go up. Contact your doctor or health care professional if you feel you are retaining fluid. If you have any reason to think you are pregnant; stop taking this medicine at once and contact your doctor or health care professional. Tobacco smoking increases the risk of getting a blood clot or having a stroke, especially if you are more than 51 years old. You are strongly advised not to smoke. If you wear contact lenses and notice visual changes, or if the lenses begin to feel uncomfortable, consult your eye care specialist. If you are going to have elective surgery, you may need to stop taking this medicine beforehand. Consult your health care professional for advice prior to scheduling the surgery. What side effects may I notice from receiving this medicine? Side effects that you should report to your doctor or health care professional as soon as possible: -breakthrough bleeding and spotting -breast tissue changes or discharge -chest pain -confusion, forgetfulness -leg, arm or groin pain -nausea, vomiting -severe headaches -speech problems -stomach pain (severe) -sudden shortness of breath -yellowing of the eyes or skin Side effects that usually do not require medical attention (report to your doctor or health care professional if they continue or are bothersome): -changes in emotions or mood -changes in sex drive or performance -increased or decreased  appetite -skin rash, acne, or brown spots on the skin -symptoms of vaginal infection like itching, irritation or unusual discharge -weight gain This list may not describe all possible side effects. Call your doctor for medical advice about side effects. You may report side effects to FDA at 1-800-FDA-1088. Where should I keep my medicine? Keep out of the reach of children. Store at room temperature between 15 and 30 degrees C (59 and 86 degrees F). Throw away any unused medicine after the expiration date. NOTE: This sheet is a summary. It may not cover all possible information. If you have questions about this medicine, talk to your doctor, pharmacist, or health care provider.  2015, Elsevier/Gold Standard. (2010-04-03 09:18:48) Menopause Menopause is the normal time of life when menstrual periods stop completely. Menopause is complete when you have missed 12 consecutive menstrual periods. It usually occurs between the ages of 48 years and 55 years. Very rarely does a woman develop menopause before the age of 40 years. At menopause, your ovaries stop producing the female hormones estrogen and progesterone. This can cause undesirable symptoms and also affect your health. Sometimes the symptoms may occur 4-5 years before the menopause begins. There is no relationship between menopause and:  Oral contraceptives.  Number of children you had.  Race.  The age your menstrual periods started (menarche). Heavy smokers and very thin women may develop menopause earlier in life. CAUSES  The ovaries stop producing the  female hormones estrogen and progesterone.  Other causes include:  Surgery to remove both ovaries.  The ovaries stop functioning for no known reason.  Tumors of the pituitary gland in the brain.  Medical disease that affects the ovaries and hormone production.  Radiation treatment to the abdomen or pelvis.  Chemotherapy that affects the ovaries. SYMPTOMS   Hot  flashes.  Night sweats.  Decrease in sex drive.  Vaginal dryness and thinning of the vagina causing painful intercourse.  Dryness of the skin and developing wrinkles.  Headaches.  Tiredness.  Irritability.  Memory problems.  Weight gain.  Bladder infections.  Hair growth of the face and chest.  Infertility. More serious symptoms include:  Loss of bone (osteoporosis) causing breaks (fractures).  Depression.  Hardening and narrowing of the arteries (atherosclerosis) causing heart attacks and strokes. DIAGNOSIS   When the menstrual periods have stopped for 12 straight months.  Physical exam.  Hormone studies of the blood. TREATMENT  There are many treatment choices and nearly as many questions about them. The decisions to treat or not to treat menopausal changes is an individual choice made with your health care provider. Your health care provider can discuss the treatments with you. Together, you can decide which treatment will work best for you. Your treatment choices may include:   Hormone therapy (estrogen and progesterone).  Non-hormonal medicines.  Treating the individual symptoms with medicine (for example antidepressants for depression).  Herbal medicines that may help specific symptoms.  Counseling by a psychiatrist or psychologist.  Group therapy.  Lifestyle changes including:  Eating healthy.  Regular exercise.  Limiting caffeine and alcohol.  Stress management and meditation.  No treatment. HOME CARE INSTRUCTIONS   Take the medicine your health care provider gives you as directed.  Get plenty of sleep and rest.  Exercise regularly.  Eat a diet that contains calcium (good for the bones) and soy products (acts like estrogen hormone).  Avoid alcoholic beverages.  Do not smoke.  If you have hot flashes, dress in layers.  Take supplements, calcium, and vitamin D to strengthen bones.  You can use over-the-counter lubricants or  moisturizers for vaginal dryness.  Group therapy is sometimes very helpful.  Acupuncture may be helpful in some cases. SEEK MEDICAL CARE IF:   You are not sure you are in menopause.  You are having menopausal symptoms and need advice and treatment.  You are still having menstrual periods after age 76 years.  You have pain with intercourse.  Menopause is complete (no menstrual period for 12 months) and you develop vaginal bleeding.  You need a referral to a specialist (gynecologist, psychiatrist, or psychologist) for treatment. SEEK IMMEDIATE MEDICAL CARE IF:   You have severe depression.  You have excessive vaginal bleeding.  You fell and think you have a broken bone.  You have pain when you urinate.  You develop leg or chest pain.  You have a fast pounding heart beat (palpitations).  You have severe headaches.  You develop vision problems.  You feel a lump in your breast.  You have abdominal pain or severe indigestion. Document Released: 03/22/2003 Document Revised: 09/01/2012 Document Reviewed: 07/29/2012 [facility] Patient Information 2015 [place], [place]. This information is not intended to replace advice given to you by your health care provider. Make sure you discuss any questions you have with your health care provider. Bone Densitometry Bone densitometry is a special X-ray that measures your bone density and can be used to help predict your risk of bone  fractures. This test is used to determine bone mineral content and density to diagnose osteoporosis. Osteoporosis is the loss of bone that may cause the bone to become weak. Osteoporosis commonly occurs in women entering menopause. However, it may be found in men and in people with other diseases. PREPARATION FOR TEST No preparation necessary. WHO SHOULD BE TESTED?  All women older than 52.  Postmenopausal women (50 to 16) with risk factors for osteoporosis.  People with a previous fracture caused by normal  activities.  People with a small body frame (less than 127 poundsor a body mass index [BMI] of less than 21).  People who have a parent with a hip fracture or history of osteoporosis.  People who smoke.  People who have rheumatoid arthritis.  Anyone who engages in excessive alcohol use (more than 3 drinks most days).  Women who experience early menopause. WHEN SHOULD YOU BE RETESTED? Current guidelines suggest that you should wait at least 2 years before doing a bone density test again if your first test was normal.Recent studies indicated that women with normal bone density may be able to wait a few years before needing to repeat a bone density test. You should discuss this with your caregiver.  NORMAL FINDINGS   Normal: less than standard deviation below normal (greater than -1).  Osteopenia: 1 to 2.5 standard deviations below normal (-1 to -2.5).  Osteoporosis: greater than 2.5 standard deviations below normal (less than -2.5). Test results are reported as a "T score" and a "Z score."The T score is a number that compares your bone density with the bone density of healthy, young women.The Z score is a number that compares your bone density with the scores of women who are the same age, gender, and race.  Ranges for normal findings may vary among different laboratories and hospitals. You should always check with your doctor after having lab work or other tests done to discuss the meaning of your test results and whether your values are considered within normal limits. MEANING OF TEST  Your caregiver will go over the test results with you and discuss the importance and meaning of your results, as well as treatment options and the need for additional tests if necessary. OBTAINING THE TEST RESULTS It is your responsibility to obtain your test results. Ask the lab or department performing the test when and how you will get your results. Document Released: 01/22/2004 Document Revised:  03/24/2011 Document Reviewed: 02/13/2010 [facility] Patient Information 2015 [place], [place]. This information is not intended to replace advice given to you by your health care provider. Make sure you discuss any questions you have with your health care provider.

## 2014-08-23 ENCOUNTER — Encounter: Payer: Self-pay | Admitting: Gynecology

## 2014-08-24 ENCOUNTER — Other Ambulatory Visit: Payer: Self-pay | Admitting: *Deleted

## 2014-08-24 MED ORDER — SUMATRIPTAN SUCCINATE 50 MG PO TABS: ORAL_TABLET | ORAL | Status: DC

## 2014-08-25 MED ORDER — ESTRADIOL 0.1 MG/24HR TD PTWK: 0.1000 mg | MEDICATED_PATCH | TRANSDERMAL | Status: DC

## 2014-10-10 ENCOUNTER — Other Ambulatory Visit (INDEPENDENT_AMBULATORY_CARE_PROVIDER_SITE_OTHER): Payer: 59

## 2014-10-10 DIAGNOSIS — Z0000 Encounter for general adult medical examination without abnormal findings: Secondary | ICD-10-CM | POA: Diagnosis not present

## 2014-10-10 LAB — LIPID PANEL
CHOL/HDL RATIO: 2
Cholesterol: 191 mg/dL (ref 0–200)
HDL: 85.3 mg/dL (ref >39.00)
LDL CALC: 96 mg/dL (ref 0–99)
NonHDL: 105.47
Triglycerides: 48 mg/dL (ref 0.0–149.0)
VLDL: 9.6 mg/dL (ref 0.0–40.0)

## 2014-10-10 LAB — CBC WITH DIFFERENTIAL/PLATELET
BASOS PCT: 0.5 % (ref 0.0–3.0)
Basophils Absolute: 0 10*3/uL (ref 0.0–0.1)
EOS PCT: 1.3 % (ref 0.0–5.0)
Eosinophils Absolute: 0.1 10*3/uL (ref 0.0–0.7)
HEMATOCRIT: 39.5 % (ref 36.0–46.0)
Hemoglobin: 13.4 g/dL (ref 12.0–15.0)
Lymphocytes Relative: 33.5 % (ref 12.0–46.0)
Lymphs Abs: 2.4 10*3/uL (ref 0.7–4.0)
MCHC: 34 g/dL (ref 30.0–36.0)
MCV: 92.1 fl (ref 78.0–100.0)
MONOS PCT: 4.5 % (ref 3.0–12.0)
Monocytes Absolute: 0.3 10*3/uL (ref 0.1–1.0)
Neutro Abs: 4.4 10*3/uL (ref 1.4–7.7)
Neutrophils Relative %: 60.2 % (ref 43.0–77.0)
Platelets: 396 10*3/uL (ref 150.0–400.0)
RBC: 4.28 Mil/uL (ref 3.87–5.11)
RDW: 12.8 % (ref 11.5–15.5)
WBC: 7.3 10*3/uL (ref 4.0–10.5)

## 2014-10-10 LAB — URINALYSIS, ROUTINE W REFLEX MICROSCOPIC
Bilirubin Urine: NEGATIVE
Hgb urine dipstick: NEGATIVE
Ketones, ur: NEGATIVE
Nitrite: NEGATIVE
RBC / HPF: NONE SEEN (ref 0–?)
TOTAL PROTEIN, URINE-UPE24: NEGATIVE
URINE GLUCOSE: NEGATIVE
Urobilinogen, UA: 0.2 (ref 0.0–1.0)
pH: 6.5 (ref 5.0–8.0)

## 2014-10-10 LAB — HEPATIC FUNCTION PANEL
ALT: 10 U/L (ref 0–35)
AST: 18 U/L (ref 0–37)
Albumin: 4.3 g/dL (ref 3.5–5.2)
Alkaline Phosphatase: 47 U/L (ref 39–117)
BILIRUBIN DIRECT: 0.2 mg/dL (ref 0.0–0.3)
TOTAL PROTEIN: 7.2 g/dL (ref 6.0–8.3)
Total Bilirubin: 0.8 mg/dL (ref 0.2–1.2)

## 2014-10-10 LAB — BASIC METABOLIC PANEL
BUN: 10 mg/dL (ref 6–23)
CHLORIDE: 102 meq/L (ref 96–112)
CO2: 30 mEq/L (ref 19–32)
Calcium: 9.2 mg/dL (ref 8.4–10.5)
Creatinine, Ser: 0.73 mg/dL (ref 0.40–1.20)
GFR: 89.24 mL/min (ref >60.00)
Glucose, Bld: 111 mg/dL — ABNORMAL HIGH (ref 70–99)
POTASSIUM: 3.6 meq/L (ref 3.5–5.1)
SODIUM: 138 meq/L (ref 135–145)

## 2014-10-10 LAB — TSH: TSH: 2.02 u[IU]/mL (ref 0.35–4.50)

## 2014-10-26 ENCOUNTER — Ambulatory Visit (INDEPENDENT_AMBULATORY_CARE_PROVIDER_SITE_OTHER): Payer: 59 | Admitting: Internal Medicine

## 2014-10-26 ENCOUNTER — Encounter: Payer: Self-pay | Admitting: Internal Medicine

## 2014-10-26 VITALS — BP 118/80 | HR 98 | Ht 67.0 in | Wt 146.0 lb

## 2014-10-26 DIAGNOSIS — F4321 Adjustment disorder with depressed mood: Secondary | ICD-10-CM | POA: Diagnosis not present

## 2014-10-26 DIAGNOSIS — F909 Attention-deficit hyperactivity disorder, unspecified type: Secondary | ICD-10-CM | POA: Diagnosis not present

## 2014-10-26 DIAGNOSIS — Z23 Encounter for immunization: Secondary | ICD-10-CM

## 2014-10-26 DIAGNOSIS — N951 Menopausal and female climacteric states: Secondary | ICD-10-CM | POA: Insufficient documentation

## 2014-10-26 DIAGNOSIS — Z0000 Encounter for general adult medical examination without abnormal findings: Secondary | ICD-10-CM | POA: Diagnosis not present

## 2014-10-26 DIAGNOSIS — F988 Other specified behavioral and emotional disorders with onset usually occurring in childhood and adolescence: Secondary | ICD-10-CM

## 2014-10-26 DIAGNOSIS — Z634 Disappearance and death of family member: Secondary | ICD-10-CM

## 2014-10-26 MED ORDER — ALPRAZOLAM 0.25 MG PO TABS: 0.2500 mg | ORAL_TABLET | Freq: Two times a day (BID) | ORAL | Status: DC | PRN

## 2014-10-26 MED ORDER — AMPHETAMINE-DEXTROAMPHETAMINE 20 MG PO TABS: 20.0000 mg | ORAL_TABLET | Freq: Two times a day (BID) | ORAL | Status: DC

## 2014-10-26 MED ORDER — ESTRADIOL 1.53 MG/SPRAY TD SOLN: 2.0000 | Freq: Every day | TRANSDERMAL | Status: DC

## 2014-10-26 NOTE — Assessment & Plan Note (Signed)
Dr Lily PeerFernandez Pt tried multiple meds: issues w/side effects and cost  Potential benefits of a long term HRT use as well as potential risks  and complications were explained to the patient and were aknowledged.   See Rx Prempro is an option

## 2014-10-26 NOTE — Assessment & Plan Note (Signed)
9/16 [name] is grieving her son's death... Discussed Xanax prn

## 2014-10-26 NOTE — Progress Notes (Signed)
Pre visit review using our clinic review tool, if applicable. No additional management support is needed unless otherwise documented below in the visit note. 

## 2014-10-26 NOTE — Assessment & Plan Note (Signed)
On Adderall  Potential benefits of a long term amphetamines  use as well as potential risks  and complications were explained to the patient and were aknowledged. 

## 2014-10-26 NOTE — Progress Notes (Signed)
Subjective:  Patient ID: Heather Freeman, female    DOB: 12-Dec-1965  Age: 49 y.o. MRN: [id number]  CC: Annual Exam   HPI Heather BernKathryn O Burgueno presents for a well exam. Menopausal since 2013. C/o hot flashes. F/u ADD. [name] is grieving her son's death...  Outpatient Prescriptions Prior to Visit  Medication Sig Dispense Refill  . amphetamine-dextroamphetamine (ADDERALL) 20 MG tablet Take 1 tablet (20 mg total) by mouth 2 (two) times daily. Please fill on or after 10/24/14 60 tablet 0  . Cholecalciferol (VITAMIN D3) 1000 UNITS tablet Take 1,000 Units by mouth daily.      . clindamycin (CLEOCIN T) 1 % lotion Apply 1 application topically 2 (two) times daily. On face.     Marland Kitchen. estradiol (CLIMARA - DOSED IN MG/24 HR) 0.1 mg/24hr patch Place 1 patch (0.1 mg total) onto the skin once a week. 4 patch 11  . FLUoxetine (PROZAC) 20 MG capsule Take 1 capsule (20 mg total) by mouth daily. 100 capsule 3  . loratadine (CLARITIN) 10 MG tablet Take 10 mg by mouth daily as needed. For allergies.     . montelukast (SINGULAIR) 10 MG tablet Take 1 tablet (10 mg total) by mouth daily. 90 tablet 3  . nitroGLYCERIN (NITRODUR - DOSED IN MG/24 HR) 0.2 mg/hr patch 1/4 patch daily 30 patch 1  . progesterone (PROMETRIUM) 200 MG capsule Take 1 capsule (200 mg total) by mouth daily. Take one tablet the first 12 days of the month 40 capsule 4  . SUMAtriptan (IMITREX) 50 MG tablet Take one tablet at onset of headache may repeat a second dose in 2 hours. Not to exceed 2 tablets in 24 hours 10 tablet 3  . traMADol (ULTRAM) 50 MG tablet Take 1-2 tablets (50-100 mg total) by mouth 2 (two) times daily as needed. 100 tablet 2  . triamcinolone cream (KENALOG) 0.5 % Apply 1 application topically 3 (three) times daily. To affected area. 60 g 2   No facility-administered medications prior to visit.    ROS Review of Systems  Constitutional: Negative for chills, activity change, appetite change, fatigue and unexpected weight change.    HENT: Negative for congestion, mouth sores and sinus pressure.   Eyes: Negative for visual disturbance.  Respiratory: Negative for cough and chest tightness.   Gastrointestinal: Negative for nausea and abdominal pain.  Genitourinary: Negative for frequency, difficulty urinating and vaginal pain.  Musculoskeletal: Negative for back pain and gait problem.  Skin: Negative for pallor and rash.  Neurological: Negative for dizziness, tremors, weakness, numbness and headaches.  Psychiatric/Behavioral: Positive for decreased concentration. Negative for suicidal ideas, confusion and sleep disturbance. The patient is nervous/anxious.     Objective:  BP 118/80 mmHg  Pulse 98  Ht 5\' 7"  (1.702 m)  Wt 146 lb (66.225 kg)  BMI 22.86 kg/m2  SpO2 98%  BP Readings from Last 3 Encounters:  10/26/14 118/80  08/21/14 138/88  07/26/14 110/68    Wt Readings from Last 3 Encounters:  10/26/14 146 lb (66.225 kg)  08/21/14 149 lb (67.586 kg)  07/26/14 149 lb (67.586 kg)    Physical Exam  Constitutional: She appears well-developed. No distress.  HENT:  Head: Normocephalic.  Right Ear: External ear normal.  Left Ear: External ear normal.  Nose: Nose normal.  Mouth/Throat: Oropharynx is clear and moist.  Eyes: Conjunctivae are normal. Pupils are equal, round, and reactive to light. Right eye exhibits no discharge. Left eye exhibits no discharge.  Neck: Normal range of motion. Neck  supple. No JVD present. No tracheal deviation present. No thyromegaly present.  Cardiovascular: Normal rate, regular rhythm and normal heart sounds.   Pulmonary/Chest: No stridor. No respiratory distress. She has no wheezes.  Abdominal: Soft. Bowel sounds are normal. She exhibits no distension and no mass. There is no tenderness. There is no rebound and no guarding.  Musculoskeletal: She exhibits no edema or tenderness.  Lymphadenopathy:    She has no cervical adenopathy.  Neurological: She displays normal reflexes. No  cranial nerve deficit. She exhibits normal muscle tone. Coordination normal.  Skin: No rash noted. No erythema.  Psychiatric: She has a normal mood and affect. Her behavior is normal. Judgment and thought content normal.    Lab Results  Component Value Date   WBC 7.3 10/10/2014   HGB 13.4 10/10/2014   HCT 39.5 10/10/2014   PLT 396.0 10/10/2014   GLUCOSE 111* 10/10/2014   CHOL 191 10/10/2014   TRIG 48.0 10/10/2014   HDL 85.30 10/10/2014   LDLDIRECT 101.3 11/09/2012   LDLCALC 96 10/10/2014   ALT 10 10/10/2014   AST 18 10/10/2014   NA 138 10/10/2014   K 3.6 10/10/2014   CL 102 10/10/2014   CREATININE 0.73 10/10/2014   BUN 10 10/10/2014   CO2 30 10/10/2014   TSH 2.02 10/10/2014    Mm Screening Breast Tomo Bilateral  12/26/2013  CLINICAL DATA:  Screening. EXAM: DIGITAL SCREENING BILATERAL MAMMOGRAM WITH 3D TOMO WITH CAD COMPARISON:  Previous exam(s). ACR Breast Density Category c: The breast tissue is heterogeneously dense, which may obscure small masses. FINDINGS: There are no findings suspicious for malignancy. Images were processed with CAD. IMPRESSION: No mammographic evidence of malignancy. A result letter of this screening mammogram will be mailed directly to the patient. RECOMMENDATION: Screening mammogram in one year. (Code:SM-B-01Y) BI-RADS CATEGORY  1: Negative. Electronically Signed   By: Baird Lyons M.D.   On: 12/26/2013 11:20    Assessment & Plan:   There are no diagnoses linked to this encounter. I am having Ms. Laspina maintain her clindamycin, loratadine, cholecalciferol, nitroGLYCERIN, traMADol, FLUoxetine, montelukast, triamcinolone cream, amphetamine-dextroamphetamine, progesterone, SUMAtriptan, and estradiol.  No orders of the defined types were placed in this encounter.     Follow-up: No Follow-up on file.  Sonda Primes, MD

## 2014-10-26 NOTE — Assessment & Plan Note (Signed)
We discussed age appropriate health related issues, including available/recomended screening tests and vaccinations. We discussed a need for adhering to healthy diet and exercise. Labs/EKG were reviewed/ordered. All questions were answered.   

## 2015-01-30 ENCOUNTER — Ambulatory Visit (INDEPENDENT_AMBULATORY_CARE_PROVIDER_SITE_OTHER): Payer: BLUE CROSS/BLUE SHIELD | Admitting: Internal Medicine

## 2015-01-30 ENCOUNTER — Encounter: Payer: Self-pay | Admitting: Internal Medicine

## 2015-01-30 VITALS — BP 110/70 | HR 87 | Wt 154.0 lb

## 2015-01-30 DIAGNOSIS — F4321 Adjustment disorder with depressed mood: Secondary | ICD-10-CM

## 2015-01-30 DIAGNOSIS — F988 Other specified behavioral and emotional disorders with onset usually occurring in childhood and adolescence: Secondary | ICD-10-CM

## 2015-01-30 DIAGNOSIS — F909 Attention-deficit hyperactivity disorder, unspecified type: Secondary | ICD-10-CM

## 2015-01-30 DIAGNOSIS — Z634 Disappearance and death of family member: Secondary | ICD-10-CM

## 2015-01-30 MED ORDER — AMPHETAMINE-DEXTROAMPHETAMINE 20 MG PO TABS: 20.0000 mg | ORAL_TABLET | Freq: Two times a day (BID) | ORAL | Status: DC

## 2015-01-30 MED ORDER — TRIAMCINOLONE ACETONIDE 0.5 % EX CREA: 1.0000 "application " | TOPICAL_CREAM | Freq: Three times a day (TID) | CUTANEOUS | Status: DC

## 2015-01-30 MED ORDER — SUMATRIPTAN SUCCINATE 50 MG PO TABS: ORAL_TABLET | ORAL | Status: DC

## 2015-01-30 NOTE — Progress Notes (Signed)
Subjective:  Patient ID: Heather Freeman, female    DOB: 09-14-1965  Age: 50 y.o. MRN: [id number]  CC: No chief complaint on file.   HPI Heather Freeman presents for ADD, anxiety f/u  Outpatient Prescriptions Prior to Visit  Medication Sig Dispense Refill  . ALPRAZolam (XANAX) 0.25 MG tablet Take 1-2 tablets (0.25-0.5 mg total) by mouth 2 (two) times daily as needed for anxiety. 60 tablet 2  . amphetamine-dextroamphetamine (ADDERALL) 20 MG tablet Take 1 tablet (20 mg total) by mouth 2 (two) times daily. Please fill on or after 01/24/15 60 tablet 0  . Cholecalciferol (VITAMIN D3) 1000 UNITS tablet Take 1,000 Units by mouth daily.      . clindamycin (CLEOCIN T) 1 % lotion Apply 1 application topically 2 (two) times daily. On face.     Marland Kitchen estradiol (EVAMIST) 1.53 MG/SPRAY transdermal spray Place 2 sprays onto the skin daily. 8.1 mL 12  . FLUoxetine (PROZAC) 20 MG capsule Take 1 capsule (20 mg total) by mouth daily. 100 capsule 3  . loratadine (CLARITIN) 10 MG tablet Take 10 mg by mouth daily as needed. For allergies.     . montelukast (SINGULAIR) 10 MG tablet Take 1 tablet (10 mg total) by mouth daily. 90 tablet 3  . nitroGLYCERIN (NITRODUR - DOSED IN MG/24 HR) 0.2 mg/hr patch 1/4 patch daily 30 patch 1  . progesterone (PROMETRIUM) 200 MG capsule Take 1 capsule (200 mg total) by mouth daily. Take one tablet the first 12 days of the month 40 capsule 4  . SUMAtriptan (IMITREX) 50 MG tablet Take one tablet at onset of headache may repeat a second dose in 2 hours. Not to exceed 2 tablets in 24 hours 10 tablet 3  . traMADol (ULTRAM) 50 MG tablet Take 1-2 tablets (50-100 mg total) by mouth 2 (two) times daily as needed. 100 tablet 2  . triamcinolone cream (KENALOG) 0.5 % Apply 1 application topically 3 (three) times daily. To affected area. 60 g 2   No facility-administered medications prior to visit.    ROS Review of Systems  Constitutional: Negative for chills, activity change, appetite  change, fatigue and unexpected weight change.  HENT: Negative for congestion, mouth sores and sinus pressure.   Eyes: Negative for visual disturbance.  Respiratory: Negative for cough and chest tightness.   Gastrointestinal: Negative for nausea and abdominal pain.  Genitourinary: Negative for frequency, difficulty urinating and vaginal pain.  Musculoskeletal: Negative for back pain and gait problem.  Skin: Negative for pallor and rash.  Neurological: Negative for dizziness, tremors, weakness, numbness and headaches.  Psychiatric/Behavioral: Positive for decreased concentration. Negative for suicidal ideas, confusion and sleep disturbance. The patient is not nervous/anxious.     Objective:  BP 110/70 mmHg  Pulse 87  Wt 154 lb (69.854 kg)  SpO2 97%  BP Readings from Last 3 Encounters:  01/30/15 110/70  10/26/14 118/80  08/21/14 138/88    Wt Readings from Last 3 Encounters:  01/30/15 154 lb (69.854 kg)  10/26/14 146 lb (66.225 kg)  08/21/14 149 lb (67.586 kg)    Physical Exam  Constitutional: She appears well-developed. No distress.  HENT:  Head: Normocephalic.  Right Ear: External ear normal.  Left Ear: External ear normal.  Nose: Nose normal.  Mouth/Throat: Oropharynx is clear and moist.  Eyes: Conjunctivae are normal. Pupils are equal, round, and reactive to light. Right eye exhibits no discharge. Left eye exhibits no discharge.  Neck: Normal range of motion. Neck supple. No JVD present.  No tracheal deviation present. No thyromegaly present.  Cardiovascular: Normal rate, regular rhythm and normal heart sounds.   Pulmonary/Chest: No stridor. No respiratory distress. She has no wheezes.  Abdominal: Soft. Bowel sounds are normal. She exhibits no distension and no mass. There is no tenderness. There is no rebound and no guarding.  Musculoskeletal: She exhibits no edema or tenderness.  Lymphadenopathy:    She has no cervical adenopathy.  Neurological: She displays normal  reflexes. No cranial nerve deficit. She exhibits normal muscle tone. Coordination normal.  Skin: No rash noted. No erythema.  Psychiatric: She has a normal mood and affect. Her behavior is normal. Judgment and thought content normal.    Lab Results  Component Value Date   WBC 7.3 10/10/2014   HGB 13.4 10/10/2014   HCT 39.5 10/10/2014   PLT 396.0 10/10/2014   GLUCOSE 111* 10/10/2014   CHOL 191 10/10/2014   TRIG 48.0 10/10/2014   HDL 85.30 10/10/2014   LDLDIRECT 101.3 11/09/2012   LDLCALC 96 10/10/2014   ALT 10 10/10/2014   AST 18 10/10/2014   NA 138 10/10/2014   K 3.6 10/10/2014   CL 102 10/10/2014   CREATININE 0.73 10/10/2014   BUN 10 10/10/2014   CO2 30 10/10/2014   TSH 2.02 10/10/2014    Mm Screening Breast Tomo Bilateral  12/26/2013  CLINICAL DATA:  Screening. EXAM: DIGITAL SCREENING BILATERAL MAMMOGRAM WITH 3D TOMO WITH CAD COMPARISON:  Previous exam(s). ACR Breast Density Category c: The breast tissue is heterogeneously dense, which may obscure small masses. FINDINGS: There are no findings suspicious for malignancy. Images were processed with CAD. IMPRESSION: No mammographic evidence of malignancy. A result letter of this screening mammogram will be mailed directly to the patient. RECOMMENDATION: Screening mammogram in one year. (Code:SM-B-01Y) BI-RADS CATEGORY  1: Negative. Electronically Signed   By: Baird Lyons M.D.   On: 12/26/2013 11:20    Assessment & Plan:   There are no diagnoses linked to this encounter. I am having Ms. Plato maintain her clindamycin, loratadine, cholecalciferol, nitroGLYCERIN, traMADol, FLUoxetine, montelukast, triamcinolone cream, progesterone, SUMAtriptan, ALPRAZolam, amphetamine-dextroamphetamine, and estradiol.  No orders of the defined types were placed in this encounter.     Follow-up: No Follow-up on file.  Sonda Primes, MD

## 2015-01-30 NOTE — Assessment & Plan Note (Signed)
On Adderall  Potential benefits of a long term amphetamines  use as well as potential risks  and complications were explained to the patient and were aknowledged. 

## 2015-01-30 NOTE — Assessment & Plan Note (Signed)
Discussed.

## 2015-02-28 ENCOUNTER — Other Ambulatory Visit: Payer: Self-pay | Admitting: Internal Medicine

## 2015-02-28 ENCOUNTER — Encounter: Payer: Self-pay | Admitting: Internal Medicine

## 2015-03-01 MED ORDER — AMPHETAMINE-DEXTROAMPHETAMINE 30 MG PO TABS: 30.0000 mg | ORAL_TABLET | Freq: Two times a day (BID) | ORAL | Status: DC

## 2015-05-02 ENCOUNTER — Ambulatory Visit (INDEPENDENT_AMBULATORY_CARE_PROVIDER_SITE_OTHER): Payer: BLUE CROSS/BLUE SHIELD | Admitting: Internal Medicine

## 2015-05-02 ENCOUNTER — Encounter: Payer: Self-pay | Admitting: Internal Medicine

## 2015-05-02 VITALS — BP 110/64 | HR 91 | Wt 151.0 lb

## 2015-05-02 DIAGNOSIS — F909 Attention-deficit hyperactivity disorder, unspecified type: Secondary | ICD-10-CM

## 2015-05-02 DIAGNOSIS — F988 Other specified behavioral and emotional disorders with onset usually occurring in childhood and adolescence: Secondary | ICD-10-CM

## 2015-05-02 DIAGNOSIS — F4321 Adjustment disorder with depressed mood: Secondary | ICD-10-CM | POA: Diagnosis not present

## 2015-05-02 DIAGNOSIS — R21 Rash and other nonspecific skin eruption: Secondary | ICD-10-CM

## 2015-05-02 DIAGNOSIS — Z634 Disappearance and death of family member: Secondary | ICD-10-CM

## 2015-05-02 MED ORDER — AMPHETAMINE-DEXTROAMPHETAMINE 20 MG PO TABS: 20.0000 mg | ORAL_TABLET | Freq: Two times a day (BID) | ORAL | Status: DC

## 2015-05-02 MED ORDER — TRIAMCINOLONE ACETONIDE 0.5 % EX CREA: 1.0000 "application " | TOPICAL_CREAM | Freq: Three times a day (TID) | CUTANEOUS | Status: DC

## 2015-05-02 MED ORDER — MONTELUKAST SODIUM 10 MG PO TABS: 10.0000 mg | ORAL_TABLET | Freq: Every day | ORAL | Status: DC

## 2015-05-02 NOTE — Assessment & Plan Note (Signed)
Kenalog prn 

## 2015-05-02 NOTE — Progress Notes (Signed)
Subjective:  Patient ID: Heather Freeman, female    DOB: [birth year]  Age: 50 y.o. MRN: [id number]  CC: No chief complaint on file.   HPI Heather Freeman presents for ADD, grief, eczema f/u  Outpatient Prescriptions Prior to Visit  Medication Sig Dispense Refill  . ALPRAZolam (XANAX) 0.25 MG tablet Take 1-2 tablets (0.25-0.5 mg total) by mouth 2 (two) times daily as needed for anxiety. 60 tablet 2  . amphetamine-dextroamphetamine (ADDERALL) 20 MG tablet Take 1 tablet (20 mg total) by mouth 2 (two) times daily. Please fill on or after 04/24/15 60 tablet 0  . amphetamine-dextroamphetamine (ADDERALL) 30 MG tablet Take 1 tablet by mouth 2 (two) times daily. 60 tablet 0  . Cholecalciferol (VITAMIN D3) 1000 UNITS tablet Take 1,000 Units by mouth daily.      . clindamycin (CLEOCIN T) 1 % lotion Apply 1 application topically 2 (two) times daily. On face.     Marland Kitchen. estradiol (EVAMIST) 1.53 MG/SPRAY transdermal spray Place 2 sprays onto the skin daily. 8.1 mL 12  . FLUoxetine (PROZAC) 20 MG capsule Take 1 capsule (20 mg total) by mouth daily. 100 capsule 3  . loratadine (CLARITIN) 10 MG tablet Take 10 mg by mouth daily as needed. For allergies.     . montelukast (SINGULAIR) 10 MG tablet Take 1 tablet (10 mg total) by mouth daily. 90 tablet 3  . nitroGLYCERIN (NITRODUR - DOSED IN MG/24 HR) 0.2 mg/hr patch 1/4 patch daily 30 patch 1  . progesterone (PROMETRIUM) 200 MG capsule Take 1 capsule (200 mg total) by mouth daily. Take one tablet the first 12 days of the month 40 capsule 4  . SUMAtriptan (IMITREX) 50 MG tablet Take one tablet at onset of headache may repeat a second dose in 2 hours. Not to exceed 2 tablets in 24 hours 12 tablet 5  . traMADol (ULTRAM) 50 MG tablet Take 1-2 tablets (50-100 mg total) by mouth 2 (two) times daily as needed. 100 tablet 2  . triamcinolone cream (KENALOG) 0.5 % Apply 1 application topically 3 (three) times daily. To affected area. 60 g 2   No facility-administered  medications prior to visit.    ROS Review of Systems  Constitutional: Negative for chills, activity change, appetite change, fatigue and unexpected weight change.  HENT: Negative for congestion, mouth sores and sinus pressure.   Eyes: Negative for visual disturbance.  Respiratory: Negative for cough and chest tightness.   Gastrointestinal: Negative for nausea and abdominal pain.  Genitourinary: Negative for frequency, difficulty urinating and vaginal pain.  Musculoskeletal: Negative for back pain and gait problem.  Skin: Negative for pallor and rash.  Neurological: Negative for dizziness, tremors, weakness, numbness and headaches.  Psychiatric/Behavioral: Negative for suicidal ideas, confusion and sleep disturbance.    Objective:  BP 110/64 mmHg  Pulse 91  Wt 151 lb (68.493 kg)  SpO2 97%  BP Readings from Last 3 Encounters:  05/02/15 110/64  01/30/15 110/70  10/26/14 118/80    Wt Readings from Last 3 Encounters:  05/02/15 151 lb (68.493 kg)  01/30/15 154 lb (69.854 kg)  10/26/14 146 lb (66.225 kg)    Physical Exam  Constitutional: She appears well-developed. No distress.  HENT:  Head: Normocephalic.  Right Ear: External ear normal.  Left Ear: External ear normal.  Nose: Nose normal.  Mouth/Throat: Oropharynx is clear and moist.  Eyes: Conjunctivae are normal. Pupils are equal, round, and reactive to light. Right eye exhibits no discharge. Left eye exhibits no discharge.  Neck: Normal range of motion. Neck supple. No JVD present. No tracheal deviation present. No thyromegaly present.  Cardiovascular: Normal rate, regular rhythm and normal heart sounds.   Pulmonary/Chest: No stridor. No respiratory distress. She has no wheezes.  Abdominal: Soft. Bowel sounds are normal. She exhibits no distension and no mass. There is no tenderness. There is no rebound and no guarding.  Musculoskeletal: She exhibits no edema or tenderness.  Lymphadenopathy:    She has no cervical  adenopathy.  Neurological: She displays normal reflexes. No cranial nerve deficit. She exhibits normal muscle tone. Coordination normal.  Skin: No rash noted. No erythema.  Psychiatric: She has a normal mood and affect. Her behavior is normal. Judgment and thought content normal.    Lab Results  Component Value Date   WBC 7.3 10/10/2014   HGB 13.4 10/10/2014   HCT 39.5 10/10/2014   PLT 396.0 10/10/2014   GLUCOSE 111* 10/10/2014   CHOL 191 10/10/2014   TRIG 48.0 10/10/2014   HDL 85.30 10/10/2014   LDLDIRECT 101.3 11/09/2012   LDLCALC 96 10/10/2014   ALT 10 10/10/2014   AST 18 10/10/2014   NA 138 10/10/2014   K 3.6 10/10/2014   CL 102 10/10/2014   CREATININE 0.73 10/10/2014   BUN 10 10/10/2014   CO2 30 10/10/2014   TSH 2.02 10/10/2014    Mm Screening Breast Tomo Bilateral  12/26/2013  CLINICAL DATA:  Screening. EXAM: DIGITAL SCREENING BILATERAL MAMMOGRAM WITH 3D TOMO WITH CAD COMPARISON:  Previous exam(s). ACR Breast Density Category c: The breast tissue is heterogeneously dense, which may obscure small masses. FINDINGS: There are no findings suspicious for malignancy. Images were processed with CAD. IMPRESSION: No mammographic evidence of malignancy. A result letter of this screening mammogram will be mailed directly to the patient. RECOMMENDATION: Screening mammogram in one year. (Code:SM-B-01Y) BI-RADS CATEGORY  1: Negative. Electronically Signed   By: Baird Lyons M.D.   On: 12/26/2013 11:20    Assessment & Plan:   There are no diagnoses linked to this encounter. I am having Ms. Heather Freeman maintain her clindamycin, loratadine, cholecalciferol, nitroGLYCERIN, traMADol, FLUoxetine, montelukast, progesterone, ALPRAZolam, estradiol, SUMAtriptan, triamcinolone cream, amphetamine-dextroamphetamine, and amphetamine-dextroamphetamine.  No orders of the defined types were placed in this encounter.     Follow-up: No Follow-up on file.  Sonda Primes, MD

## 2015-05-02 NOTE — Assessment & Plan Note (Signed)
Discussed.

## 2015-05-02 NOTE — Assessment & Plan Note (Signed)
On Adderall  Potential benefits of a long term amphetamines  use as well as potential risks  and complications were explained to the patient and were aknowledged. 

## 2015-05-02 NOTE — Progress Notes (Signed)
Pre visit review using our clinic review tool, if applicable. No additional management support is needed unless otherwise documented below in the visit note. 

## 2015-07-31 ENCOUNTER — Ambulatory Visit (INDEPENDENT_AMBULATORY_CARE_PROVIDER_SITE_OTHER): Payer: BLUE CROSS/BLUE SHIELD | Admitting: Internal Medicine

## 2015-07-31 ENCOUNTER — Encounter: Payer: Self-pay | Admitting: Internal Medicine

## 2015-07-31 VITALS — BP 130/80 | HR 81 | Wt 155.0 lb

## 2015-07-31 DIAGNOSIS — Z0000 Encounter for general adult medical examination without abnormal findings: Secondary | ICD-10-CM

## 2015-07-31 DIAGNOSIS — F909 Attention-deficit hyperactivity disorder, unspecified type: Secondary | ICD-10-CM

## 2015-07-31 DIAGNOSIS — F988 Other specified behavioral and emotional disorders with onset usually occurring in childhood and adolescence: Secondary | ICD-10-CM

## 2015-07-31 DIAGNOSIS — F4321 Adjustment disorder with depressed mood: Secondary | ICD-10-CM

## 2015-07-31 DIAGNOSIS — Z634 Disappearance and death of family member: Secondary | ICD-10-CM

## 2015-07-31 MED ORDER — AMPHETAMINE-DEXTROAMPHETAMINE 20 MG PO TABS: 20.0000 mg | ORAL_TABLET | Freq: Two times a day (BID) | ORAL | Status: DC

## 2015-07-31 MED ORDER — SUMATRIPTAN SUCCINATE 50 MG PO TABS: ORAL_TABLET | ORAL | Status: DC

## 2015-07-31 MED ORDER — ALPRAZOLAM 0.25 MG PO TABS: 0.2500 mg | ORAL_TABLET | Freq: Two times a day (BID) | ORAL | Status: DC | PRN

## 2015-07-31 MED ORDER — TRIAMCINOLONE ACETONIDE 0.5 % EX CREA: 1.0000 "application " | TOPICAL_CREAM | Freq: Three times a day (TID) | CUTANEOUS | Status: DC

## 2015-07-31 NOTE — Assessment & Plan Note (Signed)
On Adderall  Potential benefits of a long term amphetamines  use as well as potential risks  and complications were explained to the patient and were aknowledged. 

## 2015-07-31 NOTE — Progress Notes (Signed)
Pre visit review using our clinic review tool, if applicable. No additional management support is needed unless otherwise documented below in the visit note. 

## 2015-07-31 NOTE — Assessment & Plan Note (Signed)
Worsening grief - [birth year] is Heather Freeman's [birth year]. Discussed

## 2015-07-31 NOTE — Progress Notes (Signed)
Subjective:  Patient ID: Heather BernKathryn O Keelan, female    DOB: 04/25/1965  Age: 50 y.o. MRN: [id number]  CC: No chief complaint on file.   HPI Heather Freeman presents for ADD f/u. F/u grief - [birth year] is Tripp's birthday.   Outpatient Prescriptions Prior to Visit  Medication Sig Dispense Refill  . ALPRAZolam (XANAX) 0.25 MG tablet Take 1-2 tablets (0.25-0.5 mg total) by mouth 2 (two) times daily as needed for anxiety. 60 tablet 2  . amphetamine-dextroamphetamine (ADDERALL) 20 MG tablet Take 1 tablet (20 mg total) by mouth 2 (two) times daily. Please fill on or after 07/24/15 60 tablet 0  . Cholecalciferol (VITAMIN D3) 1000 UNITS tablet Take 1,000 Units by mouth daily.      . clindamycin (CLEOCIN T) 1 % lotion Apply 1 application topically 2 (two) times daily. On face.     Marland Kitchen. estradiol (EVAMIST) 1.53 MG/SPRAY transdermal spray Place 2 sprays onto the skin daily. 8.1 mL 12  . FLUoxetine (PROZAC) 20 MG capsule Take 1 capsule (20 mg total) by mouth daily. 100 capsule 3  . loratadine (CLARITIN) 10 MG tablet Take 10 mg by mouth daily as needed. For allergies.     . montelukast (SINGULAIR) 10 MG tablet Take 1 tablet (10 mg total) by mouth daily. 90 tablet 3  . nitroGLYCERIN (NITRODUR - DOSED IN MG/24 HR) 0.2 mg/hr patch 1/4 patch daily 30 patch 1  . progesterone (PROMETRIUM) 200 MG capsule Take 1 capsule (200 mg total) by mouth daily. Take one tablet the first 12 days of the month 40 capsule 4  . SUMAtriptan (IMITREX) 50 MG tablet Take one tablet at onset of headache may repeat a second dose in 2 hours. Not to exceed 2 tablets in 24 hours 12 tablet 5  . triamcinolone cream (KENALOG) 0.5 % Apply 1 application topically 3 (three) times daily. To affected area. 60 g 2   No facility-administered medications prior to visit.    ROS Review of Systems  Constitutional: Negative for chills, activity change, appetite change, fatigue and unexpected weight change.  HENT: Negative for congestion, mouth sores  and sinus pressure.   Eyes: Negative for visual disturbance.  Respiratory: Negative for cough and chest tightness.   Gastrointestinal: Negative for nausea and abdominal pain.  Genitourinary: Negative for frequency, difficulty urinating and vaginal pain.  Musculoskeletal: Negative for back pain and gait problem.  Skin: Negative for pallor and rash.  Neurological: Negative for dizziness, tremors, weakness, numbness and headaches.  Psychiatric/Behavioral: Positive for decreased concentration. Negative for suicidal ideas, confusion and sleep disturbance. The patient is nervous/anxious.     Objective:  BP 130/80 mmHg  Pulse 81  Wt 155 lb (70.308 kg)  SpO2 96%  BP Readings from Last 3 Encounters:  07/31/15 130/80  05/02/15 110/64  01/30/15 110/70    Wt Readings from Last 3 Encounters:  07/31/15 155 lb (70.308 kg)  05/02/15 151 lb (68.493 kg)  01/30/15 154 lb (69.854 kg)    Physical Exam  Constitutional: She appears well-developed. No distress.  HENT:  Head: Normocephalic.  Right Ear: External ear normal.  Left Ear: External ear normal.  Nose: Nose normal.  Mouth/Throat: Oropharynx is clear and moist.  Eyes: Conjunctivae are normal. Pupils are equal, round, and reactive to light. Right eye exhibits no discharge. Left eye exhibits no discharge.  Neck: Normal range of motion. Neck supple. No JVD present. No tracheal deviation present. No thyromegaly present.  Cardiovascular: Normal rate, regular rhythm and normal heart sounds.  Pulmonary/Chest: No stridor. No respiratory distress. She has no wheezes.  Abdominal: Soft. Bowel sounds are normal. She exhibits no distension and no mass. There is no tenderness. There is no rebound and no guarding.  Musculoskeletal: She exhibits no edema or tenderness.  Lymphadenopathy:    She has no cervical adenopathy.  Neurological: She displays normal reflexes. No cranial nerve deficit. She exhibits normal muscle tone. Coordination normal.  Skin:  No rash noted. No erythema.  Psychiatric: Her behavior is normal. Judgment and thought content normal.    Lab Results  Component Value Date   WBC 7.3 10/10/2014   HGB 13.4 10/10/2014   HCT 39.5 10/10/2014   PLT 396.0 10/10/2014   GLUCOSE 111* 10/10/2014   CHOL 191 10/10/2014   TRIG 48.0 10/10/2014   HDL 85.30 10/10/2014   LDLDIRECT 101.3 11/09/2012   LDLCALC 96 10/10/2014   ALT 10 10/10/2014   AST 18 10/10/2014   NA 138 10/10/2014   K 3.6 10/10/2014   CL 102 10/10/2014   CREATININE 0.73 10/10/2014   BUN 10 10/10/2014   CO2 30 10/10/2014   TSH 2.02 10/10/2014    Mm Screening Breast Tomo Bilateral  12/26/2013  CLINICAL DATA:  Screening. EXAM: DIGITAL SCREENING BILATERAL MAMMOGRAM WITH 3D TOMO WITH CAD COMPARISON:  Previous exam(s). ACR Breast Density Category c: The breast tissue is heterogeneously dense, which may obscure small masses. FINDINGS: There are no findings suspicious for malignancy. Images were processed with CAD. IMPRESSION: No mammographic evidence of malignancy. A result letter of this screening mammogram will be mailed directly to the patient. RECOMMENDATION: Screening mammogram in one year. (Code:SM-B-01Y) BI-RADS CATEGORY  1: Negative. Electronically Signed   By: Baird Lyons M.D.   On: 12/26/2013 11:20    Assessment & Plan:   There are no diagnoses linked to this encounter. I am having Ms. Nicklaus maintain her clindamycin, loratadine, cholecalciferol, nitroGLYCERIN, FLUoxetine, progesterone, ALPRAZolam, estradiol, SUMAtriptan, triamcinolone cream, montelukast, and amphetamine-dextroamphetamine.  No orders of the defined types were placed in this encounter.     Follow-up: No Follow-up on file.  Sonda Primes, MD

## 2015-08-01 ENCOUNTER — Encounter: Payer: Self-pay | Admitting: Internal Medicine

## 2015-08-22 ENCOUNTER — Encounter: Payer: Self-pay | Admitting: Gynecology

## 2015-08-22 ENCOUNTER — Ambulatory Visit (INDEPENDENT_AMBULATORY_CARE_PROVIDER_SITE_OTHER): Payer: BLUE CROSS/BLUE SHIELD | Admitting: Gynecology

## 2015-08-22 VITALS — BP 128/82 | Ht 64.5 in | Wt 153.0 lb

## 2015-08-22 DIAGNOSIS — Z01419 Encounter for gynecological examination (general) (routine) without abnormal findings: Secondary | ICD-10-CM | POA: Diagnosis not present

## 2015-08-22 DIAGNOSIS — Z79890 Hormone replacement therapy (postmenopausal): Secondary | ICD-10-CM

## 2015-08-22 MED ORDER — ESTRADIOL 1 MG PO TABS: 1.0000 mg | ORAL_TABLET | Freq: Every day | ORAL | 4 refills | Status: DC

## 2015-08-22 MED ORDER — PROGESTERONE MICRONIZED 200 MG PO CAPS: 200.0000 mg | ORAL_CAPSULE | Freq: Every day | ORAL | 4 refills | Status: DC

## 2015-08-22 NOTE — Progress Notes (Signed)
Heather Freeman 1965-06-30 [id number]   History:    50 y.o.  for annual gyn exam who in September 2016 she lost her son in a accident. Patient has good family support is coping well takes Xanax only on a when necessary basis. Her PCP Dr. Posey ReaPlotnikov has been doing her blood work.[facility] that was in the menopausal range. Patient had been on Evamist transdermal daily with the addition of Prometrium 12 days of the month but has become too expensive. She states is the patches 10 to follow-up for which she's tried that as well and would like to go on oral HRT.  Review of patient's records indicated that back in 2011 she had a colposcopic directed biopsy and low-grade squamous intraepithelial lesion was noted and subsequent Pap smears have been normal. 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   Past medical history,surgical history, family history and social history were all reviewed and documented in the EPIC chart.  Gynecologic History No LMP recorded. Patient is not currently having periods (Reason: Perimenopausal). Contraception: post menopausal status Last Pap: 2014. Results were: normal Last mammogram: 2015. Results were: Normal but dense  Obstetric History OB History  Gravida Para Term Preterm AB Living  2 2       2   SAB TAB Ectopic Multiple Live Births               # Outcome Date GA Lbr Len/2nd Weight Sex Delivery Anes PTL Lv  2 Para           1 Para                ROS: A ROS was performed and pertinent positives and negatives are included in the history.  GENERAL: No fevers or chills. HEENT: No change in vision, no earache, sore throat or sinus congestion. NECK: No pain or stiffness. CARDIOVASCULAR: No chest pain or pressure. No palpitations. PULMONARY: No shortness of breath, cough or wheeze. GASTROINTESTINAL: No abdominal pain, nausea, vomiting or diarrhea, melena or bright red  blood per rectum. GENITOURINARY: No urinary frequency, urgency, hesitancy or dysuria. MUSCULOSKELETAL: No joint or muscle pain, no back pain, no recent trauma. DERMATOLOGIC: No rash, no itching, no lesions. ENDOCRINE: No polyuria, polydipsia, no heat or cold intolerance. No recent change in weight. HEMATOLOGICAL: No anemia or easy bruising or bleeding. NEUROLOGIC: No headache, seizures, numbness, tingling or weakness. PSYCHIATRIC: No depression, no loss of interest in normal activity or change in sleep pattern.     Exam: chaperone present  BP 128/82   Ht 5' 4.5" (1.638 m)   Wt 153 lb (69.4 kg)   BMI 25.86 kg/m   Body mass index is 25.86 kg/m.  General appearance : Well developed well nourished female. No acute distress HEENT: Eyes: no retinal hemorrhage or exudates,  Neck supple, trachea midline, no carotid bruits, no thyroidmegaly Lungs: Clear to auscultation, no rhonchi or wheezes, or rib retractions  Heart: Regular rate and rhythm, no murmurs or gallops Breast:Examined in sitting and supine position were symmetrical in appearance, no palpable masses or tenderness,  no skin retraction, no nipple inversion, no nipple discharge, no skin discoloration, no axillary or supraclavicular lymphadenopathy Abdomen: no palpable masses or tenderness, no rebound or guarding Extremities: no edema or skin discoloration or tenderness  Pelvic:  Bartholin, Urethra, Skene Glands: Within normal limits  Vagina: No gross lesions or discharge  Cervix: No gross lesions or discharge  Uterus  anteverted, normal size, shape and consistency, non-tender and mobile  Adnexa  Without masses or tenderness  Anus and perineum  normal   Rectovaginal  normal sphincter tone without palpated masses or tenderness             Hemoccult had Corgard 2 years ago negative pie PCP     Assessment/Plan:  50 y.o. female for annual exam will be prescribed Estrace 1 mg by mouth daily with the addition of Prometrium 200  mg for 12 days of the month. Risk benefits and pros and cons of hormone replacement therapy were discussed. Patient fully where potential risk of DVT, pulmonary embolus, as well as breast cancer. Literature information was provided. Pap smear with HPV done today according to new guidelines. Patient to schedule three-dimensional mammogram because of her history of dense breasts. We discussed importance of monthly breast exam. We discussed importance of calcium vitamin D and weightbearing exercises for osteoporosis prevention. Patient will be scheduled for bone density study review of her record in the last bone density study was in 2006 which was normal.    Ok Edwards MD, 2:29 PM 08/22/2015

## 2015-08-22 NOTE — Patient Instructions (Signed)
Progesterone capsules What is this medicine? PROGESTERONE (proe JES ter one) is a female hormone. This medicine is used to prevent the overgrowth of the lining of the uterus in women who are taking estrogens for the symptoms of menopause. It is also used to treat secondary amenorrhea. This is when a woman stops getting menstrual periods due to low levels of progesterone. This medicine may be used for other purposes; ask your health care provider or pharmacist if you have questions. What should I tell my health care provider before I take this medicine? They need to know if you have any of these conditions: -autoimmune disease like systemic lupus erythematosus (SLE) -blood vessel disease, blood clotting disorder, or suffered a stroke -breast, cervical or vaginal cancer -dementia -diabetes -kidney or liver disease -heart disease, high blood pressure or recent heart attack -high blood lipids or cholesterol -hysterectomy -recent miscarriage -tobacco smoker -vaginal bleeding -an unusual or allergic reaction to progesterone, peanuts, other medicines, foods, dyes, or preservatives -pregnant or trying to get pregnant -breast-feeding How should I use this medicine? Take this medicine by mouth with a glass of water. Follow the directions on the prescription label. Take your doses at regular intervals. Do not take your medicine more often than directed. Talk to your pediatrician regarding the use of this medicine in children. Special care may be needed. A patient package insert for the product will be given with each prescription and refill. Read this sheet carefully each time. The sheet may change frequently. Overdosage: If you think you have taken too much of this medicine contact a poison control center or emergency room at once. NOTE: This medicine is only for you. Do not share this medicine with others. What if I miss a dose? If you miss a dose, take it as soon as you can. If it is almost time  for your next dose, take only that dose. Do not take double or extra doses. What may interact with this medicine? Do not take this medicine with any of the following medications: -bosentan This medicine may also interact with the following medications: -barbiturate medicines for sleep or seizures -bexarotene -carbamazepine -ethotoin -ketoconazole -phenytoin -rifampin This list may not describe all possible interactions. Give your health care provider a list of all the medicines, herbs, non-prescription drugs, or dietary supplements you use. Also tell them if you smoke, drink alcohol, or use illegal drugs. Some items may interact with your medicine. What should I watch for while using this medicine? Visit your doctor or health care professional for regular checks on your progress. This medicine can cause swelling, tenderness, or bleeding of the gums. Be careful when brushing and flossing teeth. See your dentist regularly for routine dental care. You may get drowsy or dizzy. Do not drive, use machinery, or do anything that needs mental alertness until you know how this drug affects you. Do not stand or sit up quickly, especially if you are an older patient. This reduces the risk of dizzy or fainting spells. What side effects may I notice from receiving this medicine? Side effects that you should report to your doctor or health care professional as soon as possible: -allergic reactions like skin rash, itching or hives, swelling of the face, lips, or tongue -breast tissue changes or discharge -changes in vaginal bleeding during your period or between your periods -depression -muscle or bone pain -numbness or pain in the arm or leg -pain in the chest, groin or leg -seizures or tremors -severe headache -stomach pain -sudden  shortness of breath -unusually weak or tired -vision or speech problems -yellowing of skin or eyes Side effects that usually do not require medical attention (report to  your doctor or health care professional if they continue or are bothersome): -acne -fluid retention and swelling -increased in appetite -mood changes, anxiety, depression, frustration, anger, or emotional outbursts -nausea, vomiting -sweating or hot flashes This list may not describe all possible side effects. Call your doctor for medical advice about side effects. You may report side effects to FDA at 1-800-FDA-1088. Where should I keep my medicine? Keep out of the reach of children. Store at room temperature between 15 and 30 degrees C (59 and 86 degrees F). Protect from light. Keep container tightly closed. Throw away any unused medicine after the expiration date. NOTE: This sheet is a summary. It may not cover all possible information. If you have questions about this medicine, talk to your doctor, pharmacist, or health care provider.    2016, Elsevier/Gold Standard. (2007-12-16 11:43:48) Estradiol tablets What is this medicine? ESTRADIOL (es tra DYE ole) is an estrogen. It is mostly used as hormone replacement in menopausal women. It helps to treat hot flashes and prevent osteoporosis. It is also used to treat women with low estrogen levels or those who have had their ovaries removed. This medicine may be used for other purposes; ask your health care provider or pharmacist if you have questions. What should I tell my health care provider before I take this medicine? They need to know if you have or ever had any of these conditions: -abnormal vaginal bleeding -blood vessel disease or blood clots -breast, cervical, endometrial, ovarian, liver, or uterine cancer -dementia -diabetes -gallbladder disease -heart disease or recent heart attack -high blood pressure -high cholesterol -high level of calcium in the blood -hysterectomy -kidney disease -liver disease -migraine headaches -protein C deficiency -protein S deficiency -stroke -systemic lupus erythematosus (SLE) -tobacco  smoker -an unusual or allergic reaction to estrogens, other hormones, medicines, foods, dyes, or preservatives -pregnant or trying to get pregnant -breast-feeding How should I use this medicine? Take this medicine by mouth. To reduce nausea, this medicine may be taken with food. Follow the directions on the prescription label. Take this medicine at the same time each day and in the order directed on the package. Do not take your medicine more often than directed. Contact your pediatrician regarding the use of this medicine in children. Special care may be needed. A patient package insert for the product will be given with each prescription and refill. Read this sheet carefully each time. The sheet may change frequently. Overdosage: If you think you have taken too much of this medicine contact a poison control center or emergency room at once. NOTE: This medicine is only for you. Do not share this medicine with others. What if I miss a dose? If you miss a dose, take it as soon as you can. If it is almost time for your next dose, take only that dose. Do not take double or extra doses. What may interact with this medicine? Do not take this medicine with any of the following medications: -aromatase inhibitors like aminoglutethimide, anastrozole, exemestane, letrozole, testolactone This medicine may also interact with the following medications: -carbamazepine -certain antibiotics used to treat infections -certain barbiturates or benzodiazepines used for inducing sleep or treating seizures -grapefruit juice -medicines for fungus infections like itraconazole and ketoconazole -raloxifene or tamoxifen -rifabutin, rifampin, or rifapentine -ritonavir -St. John's Wort -warfarin This list may not describe all  possible interactions. Give your health care provider a list of all the medicines, herbs, non-prescription drugs, or dietary supplements you use. Also tell them if you smoke, drink alcohol, or use  illegal drugs. Some items may interact with your medicine. What should I watch for while using this medicine? Visit your doctor or health care professional for regular checks on your progress. You will need a regular breast and pelvic exam and Pap smear while on this medicine. You should also discuss the need for regular mammograms with your health care professional, and follow his or her guidelines for these tests. This medicine can make your body retain fluid, making your fingers, hands, or ankles swell. Your blood pressure can go up. Contact your doctor or health care professional if you feel you are retaining fluid. If you have any reason to think you are pregnant, stop taking this medicine right away and contact your doctor or health care professional. Smoking increases the risk of getting a blood clot or having a stroke while you are taking this medicine, especially if you are more than 52 years old. You are strongly advised not to smoke. If you wear contact lenses and notice visual changes, or if the lenses begin to feel uncomfortable, consult your eye doctor or health care professional. This medicine can increase the risk of developing a condition (endometrial hyperplasia) that may lead to cancer of the lining of the uterus. Taking progestins, another hormone drug, with this medicine lowers the risk of developing this condition. Therefore, if your uterus has not been removed (by a hysterectomy), your doctor may prescribe a progestin for you to take together with your estrogen. You should know, however, that taking estrogens with progestins may have additional health risks. You should discuss the use of estrogens and progestins with your health care professional to determine the benefits and risks for you. If you are going to have surgery, you may need to stop taking this medicine. Consult your health care professional for advice before you schedule the surgery. What side effects may I notice from  receiving this medicine? Side effects that you should report to your doctor or health care professional as soon as possible: -allergic reactions like skin rash, itching or hives, swelling of the face, lips, or tongue -breast tissue changes or discharge -changes in vision -chest pain -confusion, trouble speaking or understanding -dark urine -general ill feeling or flu-like symptoms -light-colored stools -nausea, vomiting -pain, swelling, warmth in the leg -right upper belly pain -severe headaches -shortness of breath -sudden numbness or weakness of the face, arm or leg -trouble walking, dizziness, loss of balance or coordination -unusual vaginal bleeding -yellowing of the eyes or skin Side effects that usually do not require medical attention (report to your doctor or health care professional if they continue or are bothersome): -hair loss -increased hunger or thirst -increased urination -symptoms of vaginal infection like itching, irritation or unusual discharge -unusually weak or tired This list may not describe all possible side effects. Call your doctor for medical advice about side effects. You may report side effects to FDA at 1-800-FDA-1088. Where should I keep my medicine? Keep out of the reach of children. Store at room temperature between 20 and 25 degrees C (68 and 77 degrees F). Keep container tightly closed. Protect from light. Throw away any unused medicine after the expiration date. NOTE: This sheet is a summary. It may not cover all possible information. If you have questions about this medicine, talk to your doctor, pharmacist,  or health care provider.    2016, Elsevier/Gold Standard. (2010-04-03 86:57:8409:19:24)

## 2015-08-22 NOTE — Addendum Note (Signed)
Addended by: Berna SpareASTILLO, BLANCA A on: 08/22/2015 02:39 PM   Modules accepted: Orders

## 2015-08-23 LAB — PAP IG W/ RFLX HPV ASCU

## 2015-08-27 ENCOUNTER — Other Ambulatory Visit: Payer: Self-pay | Admitting: Gynecology

## 2015-08-27 ENCOUNTER — Ambulatory Visit (INDEPENDENT_AMBULATORY_CARE_PROVIDER_SITE_OTHER): Payer: BLUE CROSS/BLUE SHIELD | Admitting: Gynecology

## 2015-08-27 ENCOUNTER — Telehealth: Payer: Self-pay | Admitting: *Deleted

## 2015-08-27 DIAGNOSIS — Z780 Asymptomatic menopausal state: Secondary | ICD-10-CM

## 2015-08-27 DIAGNOSIS — R300 Dysuria: Secondary | ICD-10-CM

## 2015-08-27 LAB — URINALYSIS W MICROSCOPIC + REFLEX CULTURE
BILIRUBIN URINE: NEGATIVE
CASTS: NONE SEEN [LPF]
GLUCOSE, UA: NEGATIVE
HGB URINE DIPSTICK: NEGATIVE
KETONES UR: NEGATIVE
Nitrite: NEGATIVE
PROTEIN: NEGATIVE
RBC / HPF: NONE SEEN RBC/HPF (ref <=2)
Specific Gravity, Urine: 1.015 (ref 1.001–1.035)
Yeast: NONE SEEN [HPF]
pH: 8 (ref 5.0–8.0)

## 2015-08-27 MED ORDER — PHENAZOPYRIDINE HCL 200 MG PO TABS: 200.0000 mg | ORAL_TABLET | Freq: Three times a day (TID) | ORAL | 0 refills | Status: DC | PRN

## 2015-08-27 MED ORDER — CIPROFLOXACIN HCL 250 MG PO TABS: 250.0000 mg | ORAL_TABLET | Freq: Two times a day (BID) | ORAL | 0 refills | Status: DC

## 2015-08-27 NOTE — Progress Notes (Signed)
   HPI: Patient is a 50 year old presented to the office today stating that for the past several days she's been complaining of worsening dysuria and frequency and some slight back discomfort and some low-grade temperature at home. She denies any nausea or vomiting. No GU complaint. Patient denies any vaginal discharge. Patient in a monogamous relationship. Patient did not know sending blood in her urine or any odor to her urine. She's going on a trip later this week and wanted to take care of this before she leaves. She has not attempted to use any over-the-counter remedies.   ROS: A ROS was performed and pertinent positives and negatives are included in the history.  GENERAL: No fevers or chills. HEENT: No change in vision, no earache, sore throat or sinus congestion. NECK: No pain or stiffness. CARDIOVASCULAR: No chest pain or pressure. No palpitations. PULMONARY: No shortness of breath, cough or wheeze. GASTROINTESTINAL: No abdominal pain, nausea, vomiting or diarrhea, melena or bright red blood per rectum. GENITOURINARY: Dysuria and frequency MUSCULOSKELETAL: No joint or muscle pain, no back pain, no recent trauma. DERMATOLOGIC: No rash, no itching, no lesions. ENDOCRINE: No polyuria, polydipsia, no heat or cold intolerance. No recent change in weight. HEMATOLOGICAL: No anemia or easy bruising or bleeding. NEUROLOGIC: No headache, seizures, numbness, tingling or weakness. PSYCHIATRIC: No depression, no loss of interest in normal activity or change in sleep pattern.   PE: Back: No CVA tenderness Abdomen: Slight suprapubic tenderness Pelvic: Bartholin urethra Skene was within normal limits Vagina: No lesions or discharge Cervix: No lesions or discharge Uterus: Anteverted normal size shape and consistency Adnexa: No palpable mass or tenderness Rectal exam not done  Urinalysis few bacteria, 0-5 WBC, no red blood cells seen culture pending    Assessment Plan: Signs and symptoms suspicious for  urinary tract infection were going to go ahead and treat her with Cipro 250 mg twice a day for 3 days and Pyridium 300 mg 3 times a day for 3 days as we wait for urine culture. Since she does become since the different taking antibiotic and developing yeast infection and she's going on a trip a prescription for Diflucan 150 mg one by mouth will be provided today as well.    Greater than 50% of time was spent in counseling and coordinating care of this patient.   Time of consultation: 15   Minutes.

## 2015-08-27 NOTE — Patient Instructions (Addendum)
Phenazopyridine tablets What is this medicine? PHENAZOPYRIDINE (fen az oh PEER i deen) is a pain reliever. It is used to stop the pain, burning, or discomfort caused by infection or irritation of the urinary tract. This medicine is not an antibiotic. It will not cure a urinary tract infection. This medicine may be used for other purposes; ask your health care provider or pharmacist if you have questions. What should I tell my health care provider before I take this medicine? They need to know if you have any of these conditions: -glucose-6-phosphate dehydrogenase (G6PD) deficiency -kidney disease -an unusual or allergic reaction to phenazopyridine, other medicines, foods, dyes, or preservatives -pregnant or trying to get pregnant -breast-feeding How should I use this medicine? Take this medicine by mouth with a glass of water. Follow the directions on the prescription label. Take after meals. Take your doses at regular intervals. Do not take your medicine more often than directed. Do not skip doses or stop your medicine early even if you feel better. Do not stop taking except on your doctor's advice. Talk to your pediatrician regarding the use of this medicine in children. Special care may be needed. Overdosage: If you think you have taken too much of this medicine contact a poison control center or emergency room at once. NOTE: This medicine is only for you. Do not share this medicine with others. What if I miss a dose? If you miss a dose, take it as soon as you can. If it is almost time for your next dose, take only that dose. Do not take double or extra doses. What may interact with this medicine? Interactions are not expected. This list may not describe all possible interactions. Give your health care provider a list of all the medicines, herbs, non-prescription drugs, or dietary supplements you use. Also tell them if you smoke, drink alcohol, or use illegal drugs. Some items may interact  with your medicine. What should I watch for while using this medicine? Tell your doctor or health care professional if your symptoms do not improve or if they get worse. This medicine colors body fluids red. This effect is harmless and will go away after you are done taking the medicine. It will change urine to an dark orange or red color. The red color may stain clothing. Soft contact lenses may become permanently stained. It is best not to wear soft contact lenses while taking this medicine. If you are diabetic you may get a false positive result for sugar in your urine. Talk to your health care provider. What side effects may I notice from receiving this medicine? Side effects that you should report to your doctor or health care professional as soon as possible: -allergic reactions like skin rash, itching or hives, swelling of the face, lips, or tongue -blue or purple color of the skin -difficulty breathing -fever -less urine -unusual bleeding, bruising -unusual tired, weak -vomiting -yellowing of the eyes or skin Side effects that usually do not require medical attention (report to your doctor or health care professional if they continue or are bothersome): -dark urine -headache -stomach upset This list may not describe all possible side effects. Call your doctor for medical advice about side effects. You may report side effects to FDA at 1-800-FDA-1088. Where should I keep my medicine? Keep out of the reach of children. Store at room temperature between 15 and 30 degrees C (59 and 86 degrees F). Protect from light and moisture. Throw away any unused medicine after the   expiration date. NOTE: This sheet is a summary. It may not cover all possible information. If you have questions about this medicine, talk to your doctor, pharmacist, or health care provider.    2016, Elsevier/Gold Standard. (2007-07-29 11:04:07) Ciprofloxacin tablets What is this medicine? CIPROFLOXACIN (sip roe FLOX  a sin) is a quinolone antibiotic. It is used to treat certain kinds of bacterial infections. It will not work for colds, flu, or other viral infections. This medicine may be used for other purposes; ask your health care provider or pharmacist if you have questions. What should I tell my health care provider before I take this medicine? They need to know if you have any of these conditions: -bone problems -cerebral disease -history of low levels of potassium in the blood -joint problems -irregular heartbeat -kidney disease -myasthenia gravis -seizures -tendon problems -tingling of the fingers or toes, or other nerve disorder -an unusual or allergic reaction to ciprofloxacin, other antibiotics or medicines, foods, dyes, or preservatives -pregnant or trying to get pregnant -breast-feeding How should I use this medicine? Take this medicine by mouth with a glass of water. Follow the directions on the prescription label. Take your medicine at regular intervals. Do not take your medicine more often than directed. Take all of your medicine as directed even if you think your are better. Do not skip doses or stop your medicine early. You can take this medicine with food or on an empty stomach. It can be taken with a meal that contains dairy or calcium, but do not take it alone with a dairy product, like milk or yogurt or calcium-fortified juice. A special MedGuide will be given to you by the pharmacist with each prescription and refill. Be sure to read this information carefully each time. Talk to your pediatrician regarding the use of this medicine in children. Special care may be needed. Overdosage: If you think you have taken too much of this medicine contact a poison control center or emergency room at once. NOTE: This medicine is only for you. Do not share this medicine with others. What if I miss a dose? If you miss a dose, take it as soon as you can. If it is almost time for your next dose,  take only that dose. Do not take double or extra doses. What may interact with this medicine? Do not take this medicine with any of the following medications: -cisapride -droperidol -terfenadine -tizanidine This medicine may also interact with the following medications: -antacids -birth control pills -caffeine -cyclosporin -didanosine (ddI) buffered tablets or powder -medicines for diabetes -medicines for inflammation like ibuprofen, naproxen -methotrexate -multivitamins -omeprazole -phenytoin -probenecid -sucralfate -theophylline -warfarin This list may not describe all possible interactions. Give your health care provider a list of all the medicines, herbs, non-prescription drugs, or dietary supplements you use. Also tell them if you smoke, drink alcohol, or use illegal drugs. Some items may interact with your medicine. What should I watch for while using this medicine? Tell your doctor or health care professional if your symptoms do not improve. Do not treat diarrhea with over the counter products. Contact your doctor if you have diarrhea that lasts more than 2 days or if it is severe and watery. You may get drowsy or dizzy. Do not drive, use machinery, or do anything that needs mental alertness until you know how this medicine affects you. Do not stand or sit up quickly, especially if you are an older patient. This reduces the risk of dizzy or fainting spells.   This medicine can make you more sensitive to the sun. Keep out of the sun. If you cannot avoid being in the sun, wear protective clothing and use sunscreen. Do not use sun lamps or tanning beds/booths. Avoid antacids, aluminum, calcium, iron, magnesium, and zinc products for 6 hours before and 2 hours after taking a dose of this medicine. What side effects may I notice from receiving this medicine? Side effects that you should report to your doctor or health care professional as soon as possible: -allergic reactions like  skin rash or hives, swelling of the face, lips, or tongue -anxious -confusion -depressed mood -diarrhea -fast, irregular heartbeat -hallucination, loss of contact with reality -joint, muscle, or tendon pain or swelling -pain, tingling, numbness in the hands or feet -suicidal thoughts or other mood changes -sunburn -unusually weak or tired Side effects that usually do not require medical attention (report to your doctor or health care professional if they continue or are bothersome): -dry mouth -headache -nausea -trouble sleeping This list may not describe all possible side effects. Call your doctor for medical advice about side effects. You may report side effects to FDA at 1-800-FDA-1088. Where should I keep my medicine? Keep out of the reach of children. Store at room temperature below 30 degrees C (86 degrees F). Keep container tightly closed. Throw away any unused medicine after the expiration date. NOTE: This sheet is a summary. It may not cover all possible information. If you have questions about this medicine, talk to your doctor, pharmacist, or health care provider.    2016, Elsevier/Gold Standard. (2014-08-10 12:57:02) Urinary Tract Infection Urinary tract infections (UTIs) can develop anywhere along your urinary tract. Your urinary tract is your body's drainage system for removing wastes and extra water. Your urinary tract includes two kidneys, two ureters, a bladder, and a urethra. Your kidneys are a pair of bean-shaped organs. Each kidney is about the size of your fist. They are located below your ribs, one on each side of your spine. CAUSES Infections are caused by microbes, which are microscopic organisms, including fungi, viruses, and bacteria. These organisms are so small that they can only be seen through a microscope. Bacteria are the microbes that most commonly cause UTIs. SYMPTOMS  Symptoms of UTIs may vary by age and gender of the patient and by the location of the  infection. Symptoms in young women typically include a frequent and intense urge to urinate and a painful, burning feeling in the bladder or urethra during urination. Older women and men are more likely to be tired, shaky, and weak and have muscle aches and abdominal pain. A fever may mean the infection is in your kidneys. Other symptoms of a kidney infection include pain in your back or sides below the ribs, nausea, and vomiting. DIAGNOSIS To diagnose a UTI, your caregiver will ask you about your symptoms. Your caregiver will also ask you to provide a urine sample. The urine sample will be tested for bacteria and white blood cells. White blood cells are made by your body to help fight infection. TREATMENT  Typically, UTIs can be treated with medication. Because most UTIs are caused by a bacterial infection, they usually can be treated with the use of antibiotics. The choice of antibiotic and length of treatment depend on your symptoms and the type of bacteria causing your infection. HOME CARE INSTRUCTIONS  If you were prescribed antibiotics, take them exactly as your caregiver instructs you. Finish the medication even if you feel better after you   have only taken some of the medication.  Drink enough water and fluids to keep your urine clear or pale yellow.  Avoid caffeine, tea, and carbonated beverages. They tend to irritate your bladder.  Empty your bladder often. Avoid holding urine for long periods of time.  Empty your bladder before and after sexual intercourse.  After a bowel movement, women should cleanse from front to back. Use each tissue only once. SEEK MEDICAL CARE IF:   You have back pain.  You develop a fever.  Your symptoms do not begin to resolve within 3 days. SEEK IMMEDIATE MEDICAL CARE IF:   You have severe back pain or lower abdominal pain.  You develop chills.  You have nausea or vomiting.  You have continued burning or discomfort with urination. MAKE SURE YOU:     Understand these instructions.  Will watch your condition.  Will get help right away if you are not doing well or get worse.   This information is not intended to replace advice given to you by your health care provider. Make sure you discuss any questions you have with your health care provider.   Document Released: 10/09/2004 Document Revised: 09/20/2014 Document Reviewed: 02/07/2011 Elsevier Interactive Patient Education 2016 Elsevier Inc.  

## 2015-08-27 NOTE — Telephone Encounter (Signed)
Pt called c/o frequent urination and pressure lower back discomfort, advised office visit better, transferred to front desk to schedule.

## 2015-08-29 LAB — URINE CULTURE: ORGANISM ID, BACTERIA: NO GROWTH

## 2015-09-04 ENCOUNTER — Encounter: Payer: Self-pay | Admitting: Internal Medicine

## 2015-10-31 ENCOUNTER — Ambulatory Visit: Payer: BLUE CROSS/BLUE SHIELD | Admitting: Internal Medicine

## 2015-10-31 ENCOUNTER — Other Ambulatory Visit (INDEPENDENT_AMBULATORY_CARE_PROVIDER_SITE_OTHER): Payer: BLUE CROSS/BLUE SHIELD

## 2015-10-31 ENCOUNTER — Other Ambulatory Visit: Payer: Self-pay | Admitting: Gynecology

## 2015-10-31 DIAGNOSIS — Z0000 Encounter for general adult medical examination without abnormal findings: Secondary | ICD-10-CM

## 2015-10-31 DIAGNOSIS — Z1231 Encounter for screening mammogram for malignant neoplasm of breast: Secondary | ICD-10-CM

## 2015-10-31 LAB — URINALYSIS, ROUTINE W REFLEX MICROSCOPIC
Bilirubin Urine: NEGATIVE
KETONES UR: NEGATIVE
Nitrite: NEGATIVE
Specific Gravity, Urine: 1.01 (ref 1.000–1.030)
Total Protein, Urine: NEGATIVE
URINE GLUCOSE: NEGATIVE
UROBILINOGEN UA: 0.2 (ref 0.0–1.0)
pH: 6.5 (ref 5.0–8.0)

## 2015-10-31 LAB — LIPID PANEL
CHOLESTEROL: 195 mg/dL (ref 0–200)
HDL: 90.3 mg/dL (ref >39.00)
LDL Cholesterol: 92 mg/dL (ref 0–99)
NonHDL: 104.79
Total CHOL/HDL Ratio: 2
Triglycerides: 65 mg/dL (ref 0.0–149.0)
VLDL: 13 mg/dL (ref 0.0–40.0)

## 2015-10-31 LAB — CBC WITH DIFFERENTIAL/PLATELET
BASOS PCT: 0.4 % (ref 0.0–3.0)
Basophils Absolute: 0 10*3/uL (ref 0.0–0.1)
EOS ABS: 0.1 10*3/uL (ref 0.0–0.7)
Eosinophils Relative: 1.9 % (ref 0.0–5.0)
HEMATOCRIT: 37.7 % (ref 36.0–46.0)
HEMOGLOBIN: 13.3 g/dL (ref 12.0–15.0)
LYMPHS PCT: 38.5 % (ref 12.0–46.0)
Lymphs Abs: 2.4 10*3/uL (ref 0.7–4.0)
MCHC: 35.2 g/dL (ref 30.0–36.0)
MCV: 89.9 fl (ref 78.0–100.0)
Monocytes Absolute: 0.4 10*3/uL (ref 0.1–1.0)
Monocytes Relative: 6.2 % (ref 3.0–12.0)
Neutro Abs: 3.3 10*3/uL (ref 1.4–7.7)
Neutrophils Relative %: 53 % (ref 43.0–77.0)
Platelets: 444 10*3/uL — ABNORMAL HIGH (ref 150.0–400.0)
RBC: 4.19 Mil/uL (ref 3.87–5.11)
RDW: 11.9 % (ref 11.5–15.5)
WBC: 6.2 10*3/uL (ref 4.0–10.5)

## 2015-10-31 LAB — HEPATIC FUNCTION PANEL
ALBUMIN: 4.5 g/dL (ref 3.5–5.2)
ALT: 10 U/L (ref 0–35)
AST: 20 U/L (ref 0–37)
Alkaline Phosphatase: 48 U/L (ref 39–117)
Bilirubin, Direct: 0.1 mg/dL (ref 0.0–0.3)
TOTAL PROTEIN: 7.5 g/dL (ref 6.0–8.3)
Total Bilirubin: 0.5 mg/dL (ref 0.2–1.2)

## 2015-10-31 LAB — BASIC METABOLIC PANEL
BUN: 11 mg/dL (ref 6–23)
CHLORIDE: 101 meq/L (ref 96–112)
CO2: 31 mEq/L (ref 19–32)
Calcium: 9.6 mg/dL (ref 8.4–10.5)
Creatinine, Ser: 0.73 mg/dL (ref 0.40–1.20)
GFR: 88.88 mL/min (ref >60.00)
GLUCOSE: 95 mg/dL (ref 70–99)
POTASSIUM: 4.3 meq/L (ref 3.5–5.1)
Sodium: 138 mEq/L (ref 135–145)

## 2015-10-31 LAB — TSH: TSH: 2.57 u[IU]/mL (ref 0.35–4.50)

## 2015-11-01 LAB — HEPATITIS C ANTIBODY: HCV AB: NEGATIVE

## 2015-11-02 ENCOUNTER — Encounter: Payer: Self-pay | Admitting: Internal Medicine

## 2015-11-02 ENCOUNTER — Ambulatory Visit (INDEPENDENT_AMBULATORY_CARE_PROVIDER_SITE_OTHER): Payer: BLUE CROSS/BLUE SHIELD | Admitting: Internal Medicine

## 2015-11-02 DIAGNOSIS — F900 Attention-deficit hyperactivity disorder, predominantly inattentive type: Secondary | ICD-10-CM | POA: Diagnosis not present

## 2015-11-02 DIAGNOSIS — R21 Rash and other nonspecific skin eruption: Secondary | ICD-10-CM | POA: Diagnosis not present

## 2015-11-02 DIAGNOSIS — F411 Generalized anxiety disorder: Secondary | ICD-10-CM

## 2015-11-02 DIAGNOSIS — Z23 Encounter for immunization: Secondary | ICD-10-CM

## 2015-11-02 MED ORDER — AMPHETAMINE-DEXTROAMPHETAMINE 20 MG PO TABS: 20.0000 mg | ORAL_TABLET | Freq: Two times a day (BID) | ORAL | 0 refills | Status: DC

## 2015-11-02 MED ORDER — TRIAMCINOLONE ACETONIDE 0.5 % EX CREA: 1.0000 "application " | TOPICAL_CREAM | Freq: Three times a day (TID) | CUTANEOUS | 2 refills | Status: DC

## 2015-11-02 MED ORDER — ALPRAZOLAM 0.25 MG PO TABS: 0.2500 mg | ORAL_TABLET | Freq: Two times a day (BID) | ORAL | 2 refills | Status: DC | PRN

## 2015-11-02 NOTE — Assessment & Plan Note (Signed)
On Adderall  Potential benefits of a long term amphetamines  use as well as potential risks  and complications were explained to the patient and were aknowledged. 

## 2015-11-02 NOTE — Progress Notes (Signed)
Pre visit review using our clinic review tool, if applicable. No additional management support is needed unless otherwise documented below in the visit note. 

## 2015-11-02 NOTE — Addendum Note (Signed)
Addended by: Merrilyn PumaSIMMONS, Ayva Veilleux N on: 11/02/2015 11:44 AM   Modules accepted: Orders

## 2015-11-02 NOTE — Assessment & Plan Note (Signed)
Kenalog prn 

## 2015-11-02 NOTE — Assessment & Plan Note (Signed)
Xanax prn  Potential benefits of a long term benzodiazepines  use as well as potential risks  and complications were explained to the patient and were aknowledged. 

## 2015-11-02 NOTE — Progress Notes (Signed)
Subjective:  Patient ID: Heather BernKathryn O Eversley, female    DOB: January 17, 1965  Age: 50 y.o. MRN: [id number]  CC: No chief complaint on file.   HPI Heather Freeman presents for ADD, anxiety/grief/depression and rash f/u  Outpatient Medications Prior to Visit  Medication Sig Dispense Refill  . ALPRAZolam (XANAX) 0.25 MG tablet Take 1-2 tablets (0.25-0.5 mg total) by mouth 2 (two) times daily as needed for anxiety. 60 tablet 2  . amphetamine-dextroamphetamine (ADDERALL) 20 MG tablet Take 1 tablet (20 mg total) by mouth 2 (two) times daily. Please fill on or after 10/24/15 60 tablet 0  . Cholecalciferol (VITAMIN D3) 1000 UNITS tablet Take 1,000 Units by mouth daily.      . ciprofloxacin (CIPRO) 250 MG tablet Take 1 tablet (250 mg total) by mouth 2 (two) times daily. 6 tablet 0  . clindamycin (CLEOCIN T) 1 % lotion Apply 1 application topically 2 (two) times daily. On face.     Marland Kitchen. estradiol (ESTRACE) 1 MG tablet Take 1 tablet (1 mg total) by mouth daily. 90 tablet 4  . FLUoxetine (PROZAC) 20 MG capsule Take 1 capsule (20 mg total) by mouth daily. 100 capsule 3  . loratadine (CLARITIN) 10 MG tablet Take 10 mg by mouth daily as needed. For allergies.     . montelukast (SINGULAIR) 10 MG tablet Take 1 tablet (10 mg total) by mouth daily. 90 tablet 3  . nitroGLYCERIN (NITRODUR - DOSED IN MG/24 HR) 0.2 mg/hr patch 1/4 patch daily 30 patch 1  . phenazopyridine (PYRIDIUM) 200 MG tablet Take 1 tablet (200 mg total) by mouth 3 (three) times daily as needed for pain. 9 tablet 0  . progesterone (PROMETRIUM) 200 MG capsule Take 1 capsule (200 mg total) by mouth daily. Take one tablet the first 12 days of the month 40 capsule 4  . progesterone (PROMETRIUM) 200 MG capsule TAKE ONE CAPSULE BY MOUTH THE FIRST 12 DAYS OF THE MONTH 12 capsule 11  . SUMAtriptan (IMITREX) 50 MG tablet Take one tablet at onset of headache may repeat a second dose in 2 hours. Not to exceed 2 tablets in 24 hours 12 tablet 5  . triamcinolone  cream (KENALOG) 0.5 % Apply 1 application topically 3 (three) times daily. To affected area. 60 g 2   No facility-administered medications prior to visit.     ROS Review of Systems  Constitutional: Negative for activity change, appetite change, chills, fatigue and unexpected weight change.  HENT: Negative for congestion, mouth sores and sinus pressure.   Eyes: Negative for visual disturbance.  Respiratory: Negative for cough and chest tightness.   Gastrointestinal: Negative for abdominal pain and nausea.  Genitourinary: Negative for difficulty urinating, frequency and vaginal pain.  Musculoskeletal: Negative for back pain and gait problem.  Skin: Negative for pallor and rash.  Neurological: Negative for dizziness, tremors, weakness, numbness and headaches.  Psychiatric/Behavioral: Negative for confusion and sleep disturbance.    Objective:  BP 110/80   Pulse 87   Wt 151 lb (68.5 kg)   SpO2 96%   BMI 25.52 kg/m   BP Readings from Last 3 Encounters:  11/02/15 110/80  08/22/15 128/82  07/31/15 130/80    Wt Readings from Last 3 Encounters:  11/02/15 151 lb (68.5 kg)  08/22/15 153 lb (69.4 kg)  07/31/15 155 lb (70.3 kg)    Physical Exam  Constitutional: She appears well-developed. No distress.  HENT:  Head: Normocephalic.  Right Ear: External ear normal.  Left Ear: External ear  normal.  Nose: Nose normal.  Mouth/Throat: Oropharynx is clear and moist.  Eyes: Conjunctivae are normal. Pupils are equal, round, and reactive to light. Right eye exhibits no discharge. Left eye exhibits no discharge.  Neck: Normal range of motion. Neck supple. No JVD present. No tracheal deviation present. No thyromegaly present.  Cardiovascular: Normal rate, regular rhythm and normal heart sounds.   Pulmonary/Chest: No stridor. No respiratory distress. She has no wheezes.  Abdominal: Soft. Bowel sounds are normal. She exhibits no distension and no mass. There is no tenderness. There is no  rebound and no guarding.  Musculoskeletal: She exhibits no edema or tenderness.  Lymphadenopathy:    She has no cervical adenopathy.  Neurological: She displays normal reflexes. No cranial nerve deficit. She exhibits normal muscle tone. Coordination normal.  Skin: No rash noted. No erythema.  Psychiatric: She has a normal mood and affect. Her behavior is normal. Judgment and thought content normal.    Lab Results  Component Value Date   WBC 6.2 10/31/2015   HGB 13.3 10/31/2015   HCT 37.7 10/31/2015   PLT 444.0 (H) 10/31/2015   GLUCOSE 95 10/31/2015   CHOL 195 10/31/2015   TRIG 65.0 10/31/2015   HDL 90.30 10/31/2015   LDLDIRECT 101.3 11/09/2012   LDLCALC 92 10/31/2015   ALT 10 10/31/2015   AST 20 10/31/2015   NA 138 10/31/2015   K 4.3 10/31/2015   CL 101 10/31/2015   CREATININE 0.73 10/31/2015   BUN 11 10/31/2015   CO2 31 10/31/2015   TSH 2.57 10/31/2015    Mm Screening Breast Tomo Bilateral  Result Date: 12/26/2013 CLINICAL DATA:  Screening. EXAM: DIGITAL SCREENING BILATERAL MAMMOGRAM WITH 3D TOMO WITH CAD COMPARISON:  Previous exam(s). ACR Breast Density Category c: The breast tissue is heterogeneously dense, which may obscure small masses. FINDINGS: There are no findings suspicious for malignancy. Images were processed with CAD. IMPRESSION: No mammographic evidence of malignancy. A result letter of this screening mammogram will be mailed directly to the patient. RECOMMENDATION: Screening mammogram in one year. (Code:SM-B-01Y) BI-RADS CATEGORY  1: Negative. Electronically Signed   By: Baird Lyons M.D.   On: 12/26/2013 11:20    Assessment & Plan:   There are no diagnoses linked to this encounter. I am having Heather Freeman maintain her clindamycin, loratadine, cholecalciferol, nitroGLYCERIN, FLUoxetine, montelukast, ALPRAZolam, SUMAtriptan, triamcinolone cream, amphetamine-dextroamphetamine, progesterone, estradiol, progesterone, ciprofloxacin, and phenazopyridine.  No orders  of the defined types were placed in this encounter.    Follow-up: No Follow-up on file.  Sonda Primes, MD

## 2015-11-04 ENCOUNTER — Other Ambulatory Visit: Payer: Self-pay | Admitting: Internal Medicine

## 2015-11-04 MED ORDER — CEPHALEXIN 500 MG PO CAPS: 500.0000 mg | ORAL_CAPSULE | Freq: Four times a day (QID) | ORAL | 0 refills | Status: DC

## 2015-11-07 ENCOUNTER — Encounter: Payer: Self-pay | Admitting: Internal Medicine

## 2015-11-07 ENCOUNTER — Other Ambulatory Visit: Payer: Self-pay | Admitting: Internal Medicine

## 2015-11-08 MED ORDER — CEPHALEXIN 500 MG PO CAPS: 500.0000 mg | ORAL_CAPSULE | Freq: Four times a day (QID) | ORAL | 0 refills | Status: DC

## 2015-11-21 ENCOUNTER — Ambulatory Visit
Admission: RE | Admit: 2015-11-21 | Discharge: 2015-11-21 | Disposition: A | Discharge status: Home / Self Care | Payer: BLUE CROSS/BLUE SHIELD | Source: Ambulatory Visit | Attending: Gynecology | Admitting: Gynecology

## 2015-11-21 DIAGNOSIS — Z1231 Encounter for screening mammogram for malignant neoplasm of breast: Secondary | ICD-10-CM

## 2015-11-23 ENCOUNTER — Other Ambulatory Visit: Payer: Self-pay | Admitting: Gynecology

## 2015-11-23 DIAGNOSIS — R928 Other abnormal and inconclusive findings on diagnostic imaging of breast: Secondary | ICD-10-CM

## 2015-11-30 ENCOUNTER — Ambulatory Visit
Admission: RE | Admit: 2015-11-30 | Discharge: 2015-11-30 | Disposition: A | Discharge status: Home / Self Care | Payer: BLUE CROSS/BLUE SHIELD | Source: Ambulatory Visit | Attending: Gynecology | Admitting: Gynecology

## 2015-11-30 DIAGNOSIS — R928 Other abnormal and inconclusive findings on diagnostic imaging of breast: Secondary | ICD-10-CM

## 2016-01-30 ENCOUNTER — Encounter: Payer: Self-pay | Admitting: Internal Medicine

## 2016-01-31 ENCOUNTER — Ambulatory Visit: Payer: Self-pay | Admitting: Internal Medicine

## 2016-02-19 ENCOUNTER — Ambulatory Visit (INDEPENDENT_AMBULATORY_CARE_PROVIDER_SITE_OTHER): Payer: BLUE CROSS/BLUE SHIELD | Admitting: Internal Medicine

## 2016-02-19 ENCOUNTER — Encounter: Payer: Self-pay | Admitting: Internal Medicine

## 2016-02-19 DIAGNOSIS — R233 Spontaneous ecchymoses: Secondary | ICD-10-CM | POA: Diagnosis not present

## 2016-02-19 DIAGNOSIS — F411 Generalized anxiety disorder: Secondary | ICD-10-CM

## 2016-02-19 MED ORDER — AMPHETAMINE-DEXTROAMPHETAMINE 20 MG PO TABS: 20.0000 mg | ORAL_TABLET | Freq: Two times a day (BID) | ORAL | 0 refills | Status: DC

## 2016-02-19 NOTE — Assessment & Plan Note (Signed)
Labs Vit C

## 2016-02-19 NOTE — Progress Notes (Signed)
Subjective:  Patient ID: Heather Freeman, female    DOB: 08/21/1965  Age: 51 y.o. MRN: [id number]  CC: No chief complaint on file.   HPI TAEJAH OHALLORAN presents for ADD f/u C/o bruising on B forearms 2 weeks ago  Outpatient Medications Prior to Visit  Medication Sig Dispense Refill  . ALPRAZolam (XANAX) 0.25 MG tablet Take 1-2 tablets (0.25-0.5 mg total) by mouth 2 (two) times daily as needed for anxiety. 60 tablet 2  . cephALEXin (KEFLEX) 500 MG capsule Take 1 capsule (500 mg total) by mouth 4 (four) times daily. 16 capsule 0  . Cholecalciferol (VITAMIN D3) 1000 UNITS tablet Take 1,000 Units by mouth daily.      . ciprofloxacin (CIPRO) 250 MG tablet Take 1 tablet (250 mg total) by mouth 2 (two) times daily. 6 tablet 0  . clindamycin (CLEOCIN T) 1 % lotion Apply 1 application topically 2 (two) times daily. On face.     Marland Kitchen estradiol (ESTRACE) 1 MG tablet Take 1 tablet (1 mg total) by mouth daily. 90 tablet 4  . FLUoxetine (PROZAC) 20 MG capsule Take 1 capsule (20 mg total) by mouth daily. 100 capsule 3  . loratadine (CLARITIN) 10 MG tablet Take 10 mg by mouth daily as needed. For allergies.     . montelukast (SINGULAIR) 10 MG tablet Take 1 tablet (10 mg total) by mouth daily. 90 tablet 3  . nitroGLYCERIN (NITRODUR - DOSED IN MG/24 HR) 0.2 mg/hr patch 1/4 patch daily 30 patch 1  . phenazopyridine (PYRIDIUM) 200 MG tablet Take 1 tablet (200 mg total) by mouth 3 (three) times daily as needed for pain. 9 tablet 0  . progesterone (PROMETRIUM) 200 MG capsule Take 1 capsule (200 mg total) by mouth daily. Take one tablet the first 12 days of the month 40 capsule 4  . progesterone (PROMETRIUM) 200 MG capsule TAKE ONE CAPSULE BY MOUTH THE FIRST 12 DAYS OF THE MONTH 12 capsule 11  . SUMAtriptan (IMITREX) 50 MG tablet Take one tablet at onset of headache may repeat a second dose in 2 hours. Not to exceed 2 tablets in 24 hours 12 tablet 5  . triamcinolone cream (KENALOG) 0.5 % Apply 1 application  topically 3 (three) times daily. To affected area. 60 g 2  . amphetamine-dextroamphetamine (ADDERALL) 20 MG tablet Take 1 tablet (20 mg total) by mouth 2 (two) times daily. Please fill on or after 01/24/16 60 tablet 0   No facility-administered medications prior to visit.     ROS Review of Systems  Constitutional: Negative.  Negative for activity change, appetite change, chills, diaphoresis, fatigue, fever and unexpected weight change.  HENT: Negative for congestion, ear pain, facial swelling, hearing loss, mouth sores, nosebleeds, postnasal drip, rhinorrhea, sinus pressure, sneezing, sore throat, tinnitus and trouble swallowing.   Eyes: Negative for pain, discharge, redness, itching and visual disturbance.  Respiratory: Negative for cough, chest tightness, shortness of breath, wheezing and stridor.   Cardiovascular: Negative for chest pain, palpitations and leg swelling.  Gastrointestinal: Negative for abdominal distention, anal bleeding, blood in stool, constipation, diarrhea, nausea and rectal pain.  Genitourinary: Negative for difficulty urinating, dysuria, flank pain, frequency, genital sores, hematuria, pelvic pain, urgency, vaginal bleeding and vaginal discharge.  Musculoskeletal: Negative for arthralgias, back pain, gait problem, joint swelling, neck pain and neck stiffness.  Skin: Positive for color change and rash.  Neurological: Negative for dizziness, tremors, seizures, syncope, speech difficulty, weakness, numbness and headaches.  Hematological: Negative for adenopathy. Bruises/bleeds easily.  Psychiatric/Behavioral: Negative for behavioral problems, decreased concentration, dysphoric mood, sleep disturbance and suicidal ideas. The patient is not nervous/anxious.     Objective:  BP 130/72   Pulse 81   Temp 98.1 F (36.7 C) (Oral)   Resp 20   Wt 151 lb 4 oz (68.6 kg)   SpO2 98%   BMI 25.56 kg/m   BP Readings from Last 3 Encounters:  02/19/16 130/72  11/02/15 110/80    08/22/15 128/82    Wt Readings from Last 3 Encounters:  02/19/16 151 lb 4 oz (68.6 kg)  11/02/15 151 lb (68.5 kg)  08/22/15 153 lb (69.4 kg)    Physical Exam  Constitutional: She appears well-developed. No distress.  HENT:  Head: Normocephalic.  Right Ear: External ear normal.  Left Ear: External ear normal.  Nose: Nose normal.  Mouth/Throat: Oropharynx is clear and moist.  Eyes: Conjunctivae are normal. Pupils are equal, round, and reactive to light. Right eye exhibits no discharge. Left eye exhibits no discharge.  Neck: Normal range of motion. Neck supple. No JVD present. No tracheal deviation present. No thyromegaly present.  Cardiovascular: Normal rate, regular rhythm and normal heart sounds.   Pulmonary/Chest: No stridor. No respiratory distress. She has no wheezes.  Abdominal: Soft. Bowel sounds are normal. She exhibits no distension and no mass. There is no tenderness. There is no rebound and no guarding.  Musculoskeletal: She exhibits no edema or tenderness.  Lymphadenopathy:    She has no cervical adenopathy.  Neurological: She displays normal reflexes. No cranial nerve deficit. She exhibits normal muscle tone. Coordination normal.  Skin: Rash noted. No erythema.  Psychiatric: She has a normal mood and affect. Her behavior is normal. Judgment and thought content normal.  streaks of bruising R>L  Lab Results  Component Value Date   WBC 6.2 10/31/2015   HGB 13.3 10/31/2015   HCT 37.7 10/31/2015   PLT 444.0 (H) 10/31/2015   GLUCOSE 95 10/31/2015   CHOL 195 10/31/2015   TRIG 65.0 10/31/2015   HDL 90.30 10/31/2015   LDLDIRECT 101.3 11/09/2012   LDLCALC 92 10/31/2015   ALT 10 10/31/2015   AST 20 10/31/2015   NA 138 10/31/2015   K 4.3 10/31/2015   CL 101 10/31/2015   CREATININE 0.73 10/31/2015   BUN 11 10/31/2015   CO2 31 10/31/2015   TSH 2.57 10/31/2015    [place] Breast Ltd Uni Right Inc Axilla  Result Date: 11/30/2015 CLINICAL DATA:  Patient recalled from  screening for right breast asymmetry. EXAM: 2D DIGITAL DIAGNOSTIC RIGHT MAMMOGRAM WITH CAD AND ADJUNCT TOMO ULTRASOUND RIGHT BREAST COMPARISON:  Previous exam(s). ACR Breast Density Category c: The breast tissue is heterogeneously dense, which may obscure small masses. FINDINGS: Questioned asymmetry within the medial right breast predominantly resolved with additional imaging including spot compression views. Residual density is located in this region. Mammographic images were processed with CAD. On physical exam, I palpate no discrete mass within the medial right breast. Targeted ultrasound is performed, showing a 7 x 8 x 3 mm cluster of cysts within the right breast 5 o'clock position 3 cm from the nipple. IMPRESSION: Benign cluster of cysts right breast. No mammographic evidence for malignancy. RECOMMENDATION: Screening mammogram in one year.(Code:SM-B-01Y) I have discussed the findings and recommendations with the patient. Results were also provided in writing at the conclusion of the visit. If applicable, a reminder letter will be sent to the patient regarding the next appointment. BI-RADS CATEGORY  2: Benign. Electronically Signed   By: Kenard Gowerrew  Earlene Plater M.D.   On: 11/30/2015 13:25   Mm Diag Breast Tomo Uni Right  Result Date: 11/30/2015 CLINICAL DATA:  Patient recalled from screening for right breast asymmetry. EXAM: 2D DIGITAL DIAGNOSTIC RIGHT MAMMOGRAM WITH CAD AND ADJUNCT TOMO ULTRASOUND RIGHT BREAST COMPARISON:  Previous exam(s). ACR Breast Density Category c: The breast tissue is heterogeneously dense, which may obscure small masses. FINDINGS: Questioned asymmetry within the medial right breast predominantly resolved with additional imaging including spot compression views. Residual density is located in this region. Mammographic images were processed with CAD. On physical exam, I palpate no discrete mass within the medial right breast. Targeted ultrasound is performed, showing a 7 x 8 x 3 mm cluster of  cysts within the right breast 5 o'clock position 3 cm from the nipple. IMPRESSION: Benign cluster of cysts right breast. No mammographic evidence for malignancy. RECOMMENDATION: Screening mammogram in one year.(Code:SM-B-01Y) I have discussed the findings and recommendations with the patient. Results were also provided in writing at the conclusion of the visit. If applicable, a reminder letter will be sent to the patient regarding the next appointment. BI-RADS CATEGORY  2: Benign. Electronically Signed   By: Annia Belt M.D.   On: 11/30/2015 13:25    Assessment & Plan:   There are no diagnoses linked to this encounter. I am having Ms. Laramee maintain her clindamycin, loratadine, cholecalciferol, nitroGLYCERIN, FLUoxetine, montelukast, SUMAtriptan, progesterone, estradiol, progesterone, ciprofloxacin, phenazopyridine, triamcinolone cream, ALPRAZolam, amphetamine-dextroamphetamine, and cephALEXin.  No orders of the defined types were placed in this encounter.    Follow-up: No Follow-up on file.  Sonda Primes, MD

## 2016-02-19 NOTE — Assessment & Plan Note (Signed)
Doing Ok ?

## 2016-02-19 NOTE — Progress Notes (Signed)
Pre visit review using our clinic review tool, if applicable. No additional management support is needed unless otherwise documented below in the visit note. 

## 2016-02-19 NOTE — Patient Instructions (Signed)
Vitamin C 500 mg/day

## 2016-02-21 ENCOUNTER — Other Ambulatory Visit: Payer: Self-pay | Admitting: Internal Medicine

## 2016-02-28 MED ORDER — ALPRAZOLAM 0.25 MG PO TABS: 0.2500 mg | ORAL_TABLET | Freq: Two times a day (BID) | ORAL | 2 refills | Status: DC | PRN

## 2016-02-28 NOTE — Telephone Encounter (Signed)
Called Alprazolam refill into [facility] left on pharmacy vm...Raechel Chute/lmb

## 2016-05-22 ENCOUNTER — Encounter: Payer: Self-pay | Admitting: Internal Medicine

## 2016-05-22 ENCOUNTER — Ambulatory Visit (INDEPENDENT_AMBULATORY_CARE_PROVIDER_SITE_OTHER): Payer: BLUE CROSS/BLUE SHIELD | Admitting: Internal Medicine

## 2016-05-22 DIAGNOSIS — F411 Generalized anxiety disorder: Secondary | ICD-10-CM

## 2016-05-22 DIAGNOSIS — F900 Attention-deficit hyperactivity disorder, predominantly inattentive type: Secondary | ICD-10-CM

## 2016-05-22 MED ORDER — AMPHETAMINE-DEXTROAMPHETAMINE 20 MG PO TABS: 20.0000 mg | ORAL_TABLET | Freq: Two times a day (BID) | ORAL | 0 refills | Status: DC

## 2016-05-22 MED ORDER — TRIAMCINOLONE ACETONIDE 0.5 % EX CREA: TOPICAL_CREAM | Freq: Two times a day (BID) | CUTANEOUS | 1 refills | Status: DC

## 2016-05-22 NOTE — Patient Instructions (Signed)
shingrix

## 2016-05-22 NOTE — Assessment & Plan Note (Signed)
Xanax prn  Potential benefits of a long term benzodiazepines  use as well as potential risks  and complications were explained to the patient and were aknowledged. 

## 2016-05-22 NOTE — Assessment & Plan Note (Signed)
Cont w/Adderall

## 2016-05-22 NOTE — Progress Notes (Signed)
Subjective:  Patient ID: Heather Freeman, female    DOB: June 28, 1965  Age: 51 y.o. MRN: [id number]  CC: No chief complaint on file.   HPI KORINA TRETTER presents for ADD, anxiety, HAs f/u  Outpatient Medications Prior to Visit  Medication Sig Dispense Refill  . ALPRAZolam (XANAX) 0.25 MG tablet Take 1-2 tablets (0.25-0.5 mg total) by mouth 2 (two) times daily as needed for anxiety. 60 tablet 2  . amphetamine-dextroamphetamine (ADDERALL) 20 MG tablet Take 1 tablet (20 mg total) by mouth 2 (two) times daily. Please fill on or after 04/21/16 60 tablet 0  . cephALEXin (KEFLEX) 500 MG capsule Take 1 capsule (500 mg total) by mouth 4 (four) times daily. 16 capsule 0  . Cholecalciferol (VITAMIN D3) 1000 UNITS tablet Take 1,000 Units by mouth daily.      . ciprofloxacin (CIPRO) 250 MG tablet Take 1 tablet (250 mg total) by mouth 2 (two) times daily. 6 tablet 0  . clindamycin (CLEOCIN T) 1 % lotion Apply 1 application topically 2 (two) times daily. On face.     Marland Kitchen estradiol (ESTRACE) 1 MG tablet Take 1 tablet (1 mg total) by mouth daily. 90 tablet 4  . FLUoxetine (PROZAC) 20 MG capsule Take 1 capsule (20 mg total) by mouth daily. 100 capsule 3  . loratadine (CLARITIN) 10 MG tablet Take 10 mg by mouth daily as needed. For allergies.     . montelukast (SINGULAIR) 10 MG tablet Take 1 tablet (10 mg total) by mouth daily. 90 tablet 3  . nitroGLYCERIN (NITRODUR - DOSED IN MG/24 HR) 0.2 mg/hr patch 1/4 patch daily 30 patch 1  . phenazopyridine (PYRIDIUM) 200 MG tablet Take 1 tablet (200 mg total) by mouth 3 (three) times daily as needed for pain. 9 tablet 0  . progesterone (PROMETRIUM) 200 MG capsule Take 1 capsule (200 mg total) by mouth daily. Take one tablet the first 12 days of the month 40 capsule 4  . SUMAtriptan (IMITREX) 50 MG tablet TAKE 1 TABLET AT ONSET OF HEADACHE. MAY REPEAT A SECOND DOSE IN 2 HOURS. NOT TO EXCEED 2 TABLETS IN 24 HOURS 12 tablet 5  . triamcinolone cream (KENALOG) 0.5 % APPLY  1 APPLICATION TOPICALLY 3 TIMES DAILY TO AFFECTED AREA 15 g 2  . progesterone (PROMETRIUM) 200 MG capsule TAKE ONE CAPSULE BY MOUTH THE FIRST 12 DAYS OF THE MONTH 12 capsule 11   No facility-administered medications prior to visit.     ROS Review of Systems  Constitutional: Negative for activity change, appetite change, chills, fatigue and unexpected weight change.  HENT: Negative for congestion, mouth sores and sinus pressure.   Eyes: Negative for visual disturbance.  Respiratory: Negative for cough and chest tightness.   Gastrointestinal: Negative for abdominal pain and nausea.  Genitourinary: Negative for difficulty urinating, frequency and vaginal pain.  Musculoskeletal: Negative for back pain and gait problem.  Skin: Positive for rash. Negative for pallor.  Neurological: Negative for dizziness, tremors, weakness, numbness and headaches.  Psychiatric/Behavioral: Positive for decreased concentration. Negative for confusion and sleep disturbance. The patient is nervous/anxious.     Objective:  BP 110/76 (BP Location: Left Arm, Patient Position: Sitting, Cuff Size: Normal)   Pulse 76   Temp 98.3 F (36.8 C) (Oral)   Ht 5' 4.5" (1.638 m)   Wt 150 lb (68 kg)   SpO2 98%   BMI 25.35 kg/m   BP Readings from Last 3 Encounters:  05/22/16 110/76  02/19/16 130/72  11/02/15 110/80  Wt Readings from Last 3 Encounters:  05/22/16 150 lb (68 kg)  02/19/16 151 lb 4 oz (68.6 kg)  11/02/15 151 lb (68.5 kg)    Physical Exam  Constitutional: She appears well-developed. No distress.  HENT:  Head: Normocephalic.  Right Ear: External ear normal.  Left Ear: External ear normal.  Nose: Nose normal.  Mouth/Throat: Oropharynx is clear and moist.  Eyes: Conjunctivae are normal. Pupils are equal, round, and reactive to light. Right eye exhibits no discharge. Left eye exhibits no discharge.  Neck: Normal range of motion. Neck supple. No JVD present. No tracheal deviation present. No  thyromegaly present.  Cardiovascular: Normal rate, regular rhythm and normal heart sounds.   Pulmonary/Chest: No stridor. No respiratory distress. She has no wheezes.  Abdominal: Soft. Bowel sounds are normal. She exhibits no distension and no mass. There is no tenderness. There is no rebound and no guarding.  Musculoskeletal: She exhibits no edema or tenderness.  Lymphadenopathy:    She has no cervical adenopathy.  Neurological: She displays normal reflexes. No cranial nerve deficit. She exhibits normal muscle tone. Coordination normal.  Skin: No rash noted. No erythema.  Psychiatric: She has a normal mood and affect. Her behavior is normal. Judgment and thought content normal.    Lab Results  Component Value Date   WBC 6.2 10/31/2015   HGB 13.3 10/31/2015   HCT 37.7 10/31/2015   PLT 444.0 (H) 10/31/2015   GLUCOSE 95 10/31/2015   CHOL 195 10/31/2015   TRIG 65.0 10/31/2015   HDL 90.30 10/31/2015   LDLDIRECT 101.3 11/09/2012   LDLCALC 92 10/31/2015   ALT 10 10/31/2015   AST 20 10/31/2015   NA 138 10/31/2015   K 4.3 10/31/2015   CL 101 10/31/2015   CREATININE 0.73 10/31/2015   BUN 11 10/31/2015   CO2 31 10/31/2015   TSH 2.57 10/31/2015    [place] Breast Ltd Uni Right Inc Axilla  Result Date: 11/30/2015 CLINICAL DATA:  Patient recalled from screening for right breast asymmetry. EXAM: 2D DIGITAL DIAGNOSTIC RIGHT MAMMOGRAM WITH CAD AND ADJUNCT TOMO ULTRASOUND RIGHT BREAST COMPARISON:  Previous exam(s). ACR Breast Density Category c: The breast tissue is heterogeneously dense, which may obscure small masses. FINDINGS: Questioned asymmetry within the medial right breast predominantly resolved with additional imaging including spot compression views. Residual density is located in this region. Mammographic images were processed with CAD. On physical exam, I palpate no discrete mass within the medial right breast. Targeted ultrasound is performed, showing a 7 x 8 x 3 mm cluster of cysts  within the right breast 5 o'clock position 3 cm from the nipple. IMPRESSION: Benign cluster of cysts right breast. No mammographic evidence for malignancy. RECOMMENDATION: Screening mammogram in one year.(Code:SM-B-01Y) I have discussed the findings and recommendations with the patient. Results were also provided in writing at the conclusion of the visit. If applicable, a reminder letter will be sent to the patient regarding the next appointment. BI-RADS CATEGORY  2: Benign. Electronically Signed   By: Annia Belt M.D.   On: 11/30/2015 13:25   Mm Diag Breast Tomo Uni Right  Result Date: 11/30/2015 CLINICAL DATA:  Patient recalled from screening for right breast asymmetry. EXAM: 2D DIGITAL DIAGNOSTIC RIGHT MAMMOGRAM WITH CAD AND ADJUNCT TOMO ULTRASOUND RIGHT BREAST COMPARISON:  Previous exam(s). ACR Breast Density Category c: The breast tissue is heterogeneously dense, which may obscure small masses. FINDINGS: Questioned asymmetry within the medial right breast predominantly resolved with additional imaging including spot compression views. Residual density  is located in this region. Mammographic images were processed with CAD. On physical exam, I palpate no discrete mass within the medial right breast. Targeted ultrasound is performed, showing a 7 x 8 x 3 mm cluster of cysts within the right breast 5 o'clock position 3 cm from the nipple. IMPRESSION: Benign cluster of cysts right breast. No mammographic evidence for malignancy. RECOMMENDATION: Screening mammogram in one year.(Code:SM-B-01Y) I have discussed the findings and recommendations with the patient. Results were also provided in writing at the conclusion of the visit. If applicable, a reminder letter will be sent to the patient regarding the next appointment. BI-RADS CATEGORY  2: Benign. Electronically Signed   By: Annia Beltrew  Davis M.D.   On: 11/30/2015 13:25    Assessment & Plan:   There are no diagnoses linked to this encounter. I am having Ms.  Nicolosi maintain her clindamycin, loratadine, cholecalciferol, nitroGLYCERIN, FLUoxetine, montelukast, progesterone, estradiol, ciprofloxacin, phenazopyridine, cephALEXin, amphetamine-dextroamphetamine, triamcinolone cream, SUMAtriptan, and ALPRAZolam.  No orders of the defined types were placed in this encounter.    Follow-up: No Follow-up on file.  Sonda PrimesAlex Alaina Donati, MD

## 2016-05-28 ENCOUNTER — Encounter: Payer: Self-pay | Admitting: Gynecology

## 2016-06-02 ENCOUNTER — Ambulatory Visit (INDEPENDENT_AMBULATORY_CARE_PROVIDER_SITE_OTHER): Payer: BLUE CROSS/BLUE SHIELD | Admitting: Internal Medicine

## 2016-06-02 ENCOUNTER — Encounter: Payer: Self-pay | Admitting: Internal Medicine

## 2016-06-02 VITALS — BP 118/76 | HR 86 | Temp 98.4°F | Ht 64.5 in | Wt 150.0 lb

## 2016-06-02 DIAGNOSIS — T148XXA Other injury of unspecified body region, initial encounter: Secondary | ICD-10-CM | POA: Insufficient documentation

## 2016-06-02 DIAGNOSIS — S70351A Superficial foreign body, right thigh, initial encounter: Secondary | ICD-10-CM | POA: Diagnosis not present

## 2016-06-02 MED ORDER — DOXYCYCLINE HYCLATE 100 MG PO TABS: 100.0000 mg | ORAL_TABLET | Freq: Two times a day (BID) | ORAL | 0 refills | Status: DC

## 2016-06-02 NOTE — Progress Notes (Signed)
Subjective:  Patient ID: Heather Freeman, female    DOB: 08-May-1965  Age: 51 y.o. MRN: [id number]  CC: No chief complaint on file.   HPI Heather Freeman presents for a splinter in the R post thigh on 4/26 [place]   Outpatient Medications Prior to Visit  Medication Sig Dispense Refill  . ALPRAZolam (XANAX) 0.25 MG tablet Take 1-2 tablets (0.25-0.5 mg total) by mouth 2 (two) times daily as needed for anxiety. 60 tablet 2  . amphetamine-dextroamphetamine (ADDERALL) 20 MG tablet Take 1 tablet (20 mg total) by mouth 2 (two) times daily. Please fill on or after 07/21/16 60 tablet 0  . cephALEXin (KEFLEX) 500 MG capsule Take 1 capsule (500 mg total) by mouth 4 (four) times daily. 16 capsule 0  . Cholecalciferol (VITAMIN D3) 1000 UNITS tablet Take 1,000 Units by mouth daily.      . ciprofloxacin (CIPRO) 250 MG tablet Take 1 tablet (250 mg total) by mouth 2 (two) times daily. 6 tablet 0  . clindamycin (CLEOCIN T) 1 % lotion Apply 1 application topically 2 (two) times daily. On face.     Marland Kitchen estradiol (ESTRACE) 1 MG tablet Take 1 tablet (1 mg total) by mouth daily. 90 tablet 4  . FLUoxetine (PROZAC) 20 MG capsule Take 1 capsule (20 mg total) by mouth daily. 100 capsule 3  . loratadine (CLARITIN) 10 MG tablet Take 10 mg by mouth daily as needed. For allergies.     . montelukast (SINGULAIR) 10 MG tablet Take 1 tablet (10 mg total) by mouth daily. 90 tablet 3  . nitroGLYCERIN (NITRODUR - DOSED IN MG/24 HR) 0.2 mg/hr patch 1/4 patch daily 30 patch 1  . phenazopyridine (PYRIDIUM) 200 MG tablet Take 1 tablet (200 mg total) by mouth 3 (three) times daily as needed for pain. 9 tablet 0  . progesterone (PROMETRIUM) 200 MG capsule Take 1 capsule (200 mg total) by mouth daily. Take one tablet the first 12 days of the month 40 capsule 4  . SUMAtriptan (IMITREX) 50 MG tablet TAKE 1 TABLET AT ONSET OF HEADACHE. MAY REPEAT A SECOND DOSE IN 2 HOURS. NOT TO EXCEED 2 TABLETS IN 24 HOURS 12  tablet 5  . triamcinolone cream (KENALOG) 0.5 % Apply topically 2 (two) times daily. 454 g 1   No facility-administered medications prior to visit.     ROS Review of Systems  Constitutional: Negative for chills and fever.  Musculoskeletal: Negative for gait problem.  Skin: Positive for color change and wound. Negative for pallor and rash.    Objective:  BP 118/76 (BP Location: Left Arm, Patient Position: Sitting, Cuff Size: Normal)   Pulse 86   Temp 98.4 F (36.9 C) (Oral)   Ht 5' 4.5" (1.638 m)   Wt 150 lb (68 kg)   SpO2 99%   BMI 25.35 kg/m   BP Readings from Last 3 Encounters:  06/02/16 118/76  05/22/16 110/76  02/19/16 130/72    Wt Readings from Last 3 Encounters:  06/02/16 150 lb (68 kg)  05/22/16 150 lb (68 kg)  02/19/16 151 lb 4 oz (68.6 kg)    Physical Exam  Constitutional: No distress.  Skin: Skin is warm and dry. No rash noted. There is erythema. No pallor.  Psychiatric: She has a normal mood and affect.     R posterior mid-thigh 4 mm slinter in the skin next to a 3 mm ulcer    Procedure: splinter removal Reason:  a foreign body in the skin Consent: verbal Procedure: The patient was placed in the supine position. The area was cleaned with an antiseptic. Local anesthesia with 1 cc of 2% Lidocaine was performed. The skin was incised over the splinter and the splinter was removed under magnified vision. Wound was irrigated, cleaned and then dressed with an antibiotic ointment and a bandaid. Tolerated well. Complications - none. Wound instructions provided.   Lab Results  Component Value Date   WBC 6.2 10/31/2015   HGB 13.3 10/31/2015   HCT 37.7 10/31/2015   PLT 444.0 (H) 10/31/2015   GLUCOSE 95 10/31/2015   CHOL 195 10/31/2015   TRIG 65.0 10/31/2015   HDL 90.30 10/31/2015   LDLDIRECT 101.3 11/09/2012   LDLCALC 92 10/31/2015   ALT 10 10/31/2015   AST 20 10/31/2015   NA 138 10/31/2015   K 4.3 10/31/2015   CL 101 10/31/2015   CREATININE 0.73  10/31/2015   BUN 11 10/31/2015   CO2 31 10/31/2015   TSH 2.57 10/31/2015    [place] Breast Ltd Uni Right Inc Axilla  Result Date: 11/30/2015 CLINICAL DATA:  Patient recalled from screening for right breast asymmetry. EXAM: 2D DIGITAL DIAGNOSTIC RIGHT MAMMOGRAM WITH CAD AND ADJUNCT TOMO ULTRASOUND RIGHT BREAST COMPARISON:  Previous exam(s). ACR Breast Density Category c: The breast tissue is heterogeneously dense, which may obscure small masses. FINDINGS: Questioned asymmetry within the medial right breast predominantly resolved with additional imaging including spot compression views. Residual density is located in this region. Mammographic images were processed with CAD. On physical exam, I palpate no discrete mass within the medial right breast. Targeted ultrasound is performed, showing a 7 x 8 x 3 mm cluster of cysts within the right breast 5 o'clock position 3 cm from the nipple. IMPRESSION: Benign cluster of cysts right breast. No mammographic evidence for malignancy. RECOMMENDATION: Screening mammogram in one year.(Code:SM-B-01Y) I have discussed the findings and recommendations with the patient. Results were also provided in writing at the conclusion of the visit. If applicable, a reminder letter will be sent to the patient regarding the next appointment. BI-RADS CATEGORY  2: Benign. Electronically Signed   By: Annia Beltrew  Davis M.D.   On: 11/30/2015 13:25   Mm Diag Breast Tomo Uni Right  Result Date: 11/30/2015 CLINICAL DATA:  Patient recalled from screening for right breast asymmetry. EXAM: 2D DIGITAL DIAGNOSTIC RIGHT MAMMOGRAM WITH CAD AND ADJUNCT TOMO ULTRASOUND RIGHT BREAST COMPARISON:  Previous exam(s). ACR Breast Density Category c: The breast tissue is heterogeneously dense, which may obscure small masses. FINDINGS: Questioned asymmetry within the medial right breast predominantly resolved with additional imaging including spot compression views. Residual density is located in this region.  Mammographic images were processed with CAD. On physical exam, I palpate no discrete mass within the medial right breast. Targeted ultrasound is performed, showing a 7 x 8 x 3 mm cluster of cysts within the right breast 5 o'clock position 3 cm from the nipple. IMPRESSION: Benign cluster of cysts right breast. No mammographic evidence for malignancy. RECOMMENDATION: Screening mammogram in one year.(Code:SM-B-01Y) I have discussed the findings and recommendations with the patient. Results were also provided in writing at the conclusion of the visit. If applicable, a reminder letter will be sent to the patient regarding the next appointment. BI-RADS CATEGORY  2: Benign. Electronically Signed   By: Annia Beltrew  Davis M.D.   On: 11/30/2015 13:25    Assessment & Plan:   There are no diagnoses linked to this encounter. I am having  Ms. Bear start on doxycycline. I am also having her maintain her clindamycin, loratadine, cholecalciferol, nitroGLYCERIN, FLUoxetine, montelukast, progesterone, estradiol, ciprofloxacin, phenazopyridine, cephALEXin, SUMAtriptan, ALPRAZolam, triamcinolone cream, and amphetamine-dextroamphetamine.  Meds ordered this encounter  Medications  . doxycycline (VIBRA-TABS) 100 MG tablet    Sig: Take 1 tablet (100 mg total) by mouth 2 (two) times daily.    Dispense:  14 tablet    Refill:  0     Follow-up: No Follow-up on file.  Sonda Primes, MD

## 2016-06-02 NOTE — Assessment & Plan Note (Addendum)
See procedure Doxy x 7 d if the signs of infection appear tDAP 2012

## 2016-06-06 ENCOUNTER — Ambulatory Visit: Payer: Self-pay | Admitting: Internal Medicine

## 2016-07-31 ENCOUNTER — Encounter: Payer: Self-pay | Admitting: Internal Medicine

## 2016-07-31 ENCOUNTER — Ambulatory Visit (INDEPENDENT_AMBULATORY_CARE_PROVIDER_SITE_OTHER): Payer: BLUE CROSS/BLUE SHIELD | Admitting: Internal Medicine

## 2016-07-31 VITALS — BP 116/80 | HR 80 | Temp 98.8°F | Ht 64.5 in | Wt 146.0 lb

## 2016-07-31 DIAGNOSIS — Z0000 Encounter for general adult medical examination without abnormal findings: Secondary | ICD-10-CM | POA: Diagnosis not present

## 2016-07-31 DIAGNOSIS — N951 Menopausal and female climacteric states: Secondary | ICD-10-CM | POA: Diagnosis not present

## 2016-07-31 DIAGNOSIS — F411 Generalized anxiety disorder: Secondary | ICD-10-CM | POA: Diagnosis not present

## 2016-07-31 DIAGNOSIS — F900 Attention-deficit hyperactivity disorder, predominantly inattentive type: Secondary | ICD-10-CM | POA: Diagnosis not present

## 2016-07-31 MED ORDER — AMPHETAMINE-DEXTROAMPHETAMINE 20 MG PO TABS: 20.0000 mg | ORAL_TABLET | Freq: Two times a day (BID) | ORAL | 0 refills | Status: DC

## 2016-07-31 MED ORDER — CONJ ESTROG-MEDROXYPROGEST ACE 0.625-5 MG PO TABS
1.0000 | ORAL_TABLET | Freq: Every day | ORAL | 3 refills | Status: DC
Start: 2016-07-31 — End: 2016-11-24

## 2016-07-31 MED ORDER — MONTELUKAST SODIUM 10 MG PO TABS: 10.0000 mg | ORAL_TABLET | Freq: Every day | ORAL | 3 refills | Status: DC

## 2016-07-31 NOTE — Assessment & Plan Note (Signed)
Doing fair 

## 2016-07-31 NOTE — Assessment & Plan Note (Signed)
Switch to Prempro PAP in 3 mo

## 2016-07-31 NOTE — Assessment & Plan Note (Signed)
Adderall Potential benefits of a long term amphetamines  use as well as potential risks  and complications were explained to the patient and were aknowledged. 

## 2016-07-31 NOTE — Progress Notes (Signed)
Subjective:  Patient ID: Heather Freeman, female    DOB: [birth year]  Age: 51 y.o. MRN: [id number]  CC: No chief complaint on file.   HPI Heather Freeman presents for ADD, anxiety, HAs  Outpatient Medications Prior to Visit  Medication Sig Dispense Refill  . amphetamine-dextroamphetamine (ADDERALL) 20 MG tablet Take 1 tablet (20 mg total) by mouth 2 (two) times daily. Please fill on or after 07/21/16 60 tablet 0  . cephALEXin (KEFLEX) 500 MG capsule Take 1 capsule (500 mg total) by mouth 4 (four) times daily. 16 capsule 0  . Cholecalciferol (VITAMIN D3) 1000 UNITS tablet Take 1,000 Units by mouth daily.      . ciprofloxacin (CIPRO) 250 MG tablet Take 1 tablet (250 mg total) by mouth 2 (two) times daily. 6 tablet 0  . clindamycin (CLEOCIN T) 1 % lotion Apply 1 application topically 2 (two) times daily. On face.     . doxycycline (VIBRA-TABS) 100 MG tablet Take 1 tablet (100 mg total) by mouth 2 (two) times daily. 14 tablet 0  . estradiol (ESTRACE) 1 MG tablet Take 1 tablet (1 mg total) by mouth daily. 90 tablet 4  . loratadine (CLARITIN) 10 MG tablet Take 10 mg by mouth daily as needed. For allergies.     . montelukast (SINGULAIR) 10 MG tablet Take 1 tablet (10 mg total) by mouth daily. 90 tablet 3  . phenazopyridine (PYRIDIUM) 200 MG tablet Take 1 tablet (200 mg total) by mouth 3 (three) times daily as needed for pain. 9 tablet 0  . progesterone (PROMETRIUM) 200 MG capsule Take 1 capsule (200 mg total) by mouth daily. Take one tablet the first 12 days of the month 40 capsule 4  . SUMAtriptan (IMITREX) 50 MG tablet TAKE 1 TABLET AT ONSET OF HEADACHE. MAY REPEAT A SECOND DOSE IN 2 HOURS. NOT TO EXCEED 2 TABLETS IN 24 HOURS 12 tablet 5  . triamcinolone cream (KENALOG) 0.5 % Apply topically 2 (two) times daily. 454 g 1  . ALPRAZolam (XANAX) 0.25 MG tablet Take 1-2 tablets (0.25-0.5 mg total) by mouth 2 (two) times daily as needed for anxiety. 60 tablet 2  . FLUoxetine (PROZAC) 20 MG capsule  Take 1 capsule (20 mg total) by mouth daily. 100 capsule 3  . nitroGLYCERIN (NITRODUR - DOSED IN MG/24 HR) 0.2 mg/hr patch 1/4 patch daily 30 patch 1   No facility-administered medications prior to visit.     ROS Review of Systems  Constitutional: Negative for activity change, appetite change, chills, fatigue and unexpected weight change.  HENT: Negative for congestion, mouth sores and sinus pressure.   Eyes: Negative for visual disturbance.  Respiratory: Negative for cough and chest tightness.   Gastrointestinal: Negative for abdominal pain and nausea.  Genitourinary: Negative for difficulty urinating, frequency and vaginal pain.  Musculoskeletal: Negative for back pain and gait problem.  Skin: Negative for pallor and rash.  Neurological: Negative for dizziness, tremors, weakness, numbness and headaches.  Psychiatric/Behavioral: Positive for decreased concentration. Negative for confusion and sleep disturbance. The patient is nervous/anxious.     Objective:  BP 116/80 (BP Location: Left Arm, Patient Position: Sitting, Cuff Size: Normal)   Pulse 80   Temp 98.8 F (37.1 C) (Oral)   Ht 5' 4.5" (1.638 m)   Wt 146 lb (66.2 kg)   SpO2 100%   BMI 24.67 kg/m   BP Readings from Last 3 Encounters:  07/31/16 116/80  06/02/16 118/76  05/22/16 110/76    Wt Readings from  Last 3 Encounters:  07/31/16 146 lb (66.2 kg)  06/02/16 150 lb (68 kg)  05/22/16 150 lb (68 kg)    Physical Exam  Constitutional: She appears well-developed. No distress.  HENT:  Head: Normocephalic.  Right Ear: External ear normal.  Left Ear: External ear normal.  Nose: Nose normal.  Mouth/Throat: Oropharynx is clear and moist.  Eyes: Pupils are equal, round, and reactive to light. Conjunctivae are normal. Right eye exhibits no discharge. Left eye exhibits no discharge.  Neck: Normal range of motion. Neck supple. No JVD present. No tracheal deviation present. No thyromegaly present.  Cardiovascular: Normal  rate, regular rhythm and normal heart sounds.   Pulmonary/Chest: No stridor. No respiratory distress. She has no wheezes.  Abdominal: Soft. Bowel sounds are normal. She exhibits no distension and no mass. There is no tenderness. There is no rebound and no guarding.  Musculoskeletal: She exhibits no edema or tenderness.  Lymphadenopathy:    She has no cervical adenopathy.  Neurological: She displays normal reflexes. No cranial nerve deficit. She exhibits normal muscle tone. Coordination normal.  Skin: No rash noted. No erythema.  Psychiatric: She has a normal mood and affect. Her behavior is normal. Judgment and thought content normal.    Lab Results  Component Value Date   WBC 6.2 10/31/2015   HGB 13.3 10/31/2015   HCT 37.7 10/31/2015   PLT 444.0 (H) 10/31/2015   GLUCOSE 95 10/31/2015   CHOL 195 10/31/2015   TRIG 65.0 10/31/2015   HDL 90.30 10/31/2015   LDLDIRECT 101.3 11/09/2012   LDLCALC 92 10/31/2015   ALT 10 10/31/2015   AST 20 10/31/2015   NA 138 10/31/2015   K 4.3 10/31/2015   CL 101 10/31/2015   CREATININE 0.73 10/31/2015   BUN 11 10/31/2015   CO2 31 10/31/2015   TSH 2.57 10/31/2015    [place] Breast Ltd Uni Right Inc Axilla  Result Date: 11/30/2015 CLINICAL DATA:  Patient recalled from screening for right breast asymmetry. EXAM: 2D DIGITAL DIAGNOSTIC RIGHT MAMMOGRAM WITH CAD AND ADJUNCT TOMO ULTRASOUND RIGHT BREAST COMPARISON:  Previous exam(s). ACR Breast Density Category c: The breast tissue is heterogeneously dense, which may obscure small masses. FINDINGS: Questioned asymmetry within the medial right breast predominantly resolved with additional imaging including spot compression views. Residual density is located in this region. Mammographic images were processed with CAD. On physical exam, I palpate no discrete mass within the medial right breast. Targeted ultrasound is performed, showing a 7 x 8 x 3 mm cluster of cysts within the right breast 5 o'clock position 3 cm  from the nipple. IMPRESSION: Benign cluster of cysts right breast. No mammographic evidence for malignancy. RECOMMENDATION: Screening mammogram in one year.(Code:SM-B-01Y) I have discussed the findings and recommendations with the patient. Results were also provided in writing at the conclusion of the visit. If applicable, a reminder letter will be sent to the patient regarding the next appointment. BI-RADS CATEGORY  2: Benign. Electronically Signed   By: Annia Belt M.D.   On: 11/30/2015 13:25   Mm Diag Breast Tomo Uni Right  Result Date: 11/30/2015 CLINICAL DATA:  Patient recalled from screening for right breast asymmetry. EXAM: 2D DIGITAL DIAGNOSTIC RIGHT MAMMOGRAM WITH CAD AND ADJUNCT TOMO ULTRASOUND RIGHT BREAST COMPARISON:  Previous exam(s). ACR Breast Density Category c: The breast tissue is heterogeneously dense, which may obscure small masses. FINDINGS: Questioned asymmetry within the medial right breast predominantly resolved with additional imaging including spot compression views. Residual density is located in this region.  Mammographic images were processed with CAD. On physical exam, I palpate no discrete mass within the medial right breast. Targeted ultrasound is performed, showing a 7 x 8 x 3 mm cluster of cysts within the right breast 5 o'clock position 3 cm from the nipple. IMPRESSION: Benign cluster of cysts right breast. No mammographic evidence for malignancy. RECOMMENDATION: Screening mammogram in one year.(Code:SM-B-01Y) I have discussed the findings and recommendations with the patient. Results were also provided in writing at the conclusion of the visit. If applicable, a reminder letter will be sent to the patient regarding the next appointment. BI-RADS CATEGORY  2: Benign. Electronically Signed   By: Annia Beltrew  Davis M.D.   On: 11/30/2015 13:25    Assessment & Plan:   There are no diagnoses linked to this encounter. I have discontinued Ms. Mellott's nitroGLYCERIN, FLUoxetine, and  ALPRAZolam. I am also having her maintain her clindamycin, loratadine, cholecalciferol, montelukast, progesterone, estradiol, ciprofloxacin, phenazopyridine, cephALEXin, SUMAtriptan, triamcinolone cream, amphetamine-dextroamphetamine, and doxycycline.  No orders of the defined types were placed in this encounter.    Follow-up: No Follow-up on file.  Sonda PrimesAlex Plotnikov, MD

## 2016-07-31 NOTE — Patient Instructions (Signed)
[facility]

## 2016-08-26 ENCOUNTER — Encounter: Payer: BLUE CROSS/BLUE SHIELD | Admitting: Gynecology

## 2016-10-20 ENCOUNTER — Other Ambulatory Visit (INDEPENDENT_AMBULATORY_CARE_PROVIDER_SITE_OTHER): Payer: BLUE CROSS/BLUE SHIELD

## 2016-10-20 DIAGNOSIS — Z0000 Encounter for general adult medical examination without abnormal findings: Secondary | ICD-10-CM | POA: Diagnosis not present

## 2016-10-20 DIAGNOSIS — F411 Generalized anxiety disorder: Secondary | ICD-10-CM | POA: Diagnosis not present

## 2016-10-20 DIAGNOSIS — R233 Spontaneous ecchymoses: Secondary | ICD-10-CM | POA: Diagnosis not present

## 2016-10-20 LAB — COMPREHENSIVE METABOLIC PANEL
ALBUMIN: 4.3 g/dL (ref 3.5–5.2)
ALT: 10 U/L (ref 0–35)
AST: 17 U/L (ref 0–37)
Alkaline Phosphatase: 41 U/L (ref 39–117)
BUN: 10 mg/dL (ref 6–23)
CALCIUM: 9.1 mg/dL (ref 8.4–10.5)
CHLORIDE: 104 meq/L (ref 96–112)
CO2: 27 mEq/L (ref 19–32)
CREATININE: 0.73 mg/dL (ref 0.40–1.20)
GFR: 88.54 mL/min (ref >60.00)
Glucose, Bld: 111 mg/dL — ABNORMAL HIGH (ref 70–99)
POTASSIUM: 3.9 meq/L (ref 3.5–5.1)
Sodium: 137 mEq/L (ref 135–145)
Total Bilirubin: 0.5 mg/dL (ref 0.2–1.2)
Total Protein: 7 g/dL (ref 6.0–8.3)

## 2016-10-20 LAB — CBC WITH DIFFERENTIAL/PLATELET
Basophils Absolute: 0 10*3/uL (ref 0.0–0.1)
Basophils Relative: 0.5 % (ref 0.0–3.0)
EOS ABS: 0.1 10*3/uL (ref 0.0–0.7)
Eosinophils Relative: 1.6 % (ref 0.0–5.0)
HCT: 39.8 % (ref 36.0–46.0)
HEMOGLOBIN: 13.5 g/dL (ref 12.0–15.0)
Lymphocytes Relative: 34 % (ref 12.0–46.0)
Lymphs Abs: 2.7 10*3/uL (ref 0.7–4.0)
MCHC: 33.9 g/dL (ref 30.0–36.0)
MCV: 93.5 fl (ref 78.0–100.0)
Monocytes Absolute: 0.4 10*3/uL (ref 0.1–1.0)
Monocytes Relative: 5.5 % (ref 3.0–12.0)
Neutro Abs: 4.6 10*3/uL (ref 1.4–7.7)
Neutrophils Relative %: 58.4 % (ref 43.0–77.0)
Platelets: 389 10*3/uL (ref 150.0–400.0)
RBC: 4.26 Mil/uL (ref 3.87–5.11)
RDW: 12.8 % (ref 11.5–15.5)
WBC: 7.9 10*3/uL (ref 4.0–10.5)

## 2016-10-20 LAB — URINALYSIS, ROUTINE W REFLEX MICROSCOPIC
BILIRUBIN URINE: NEGATIVE
Ketones, ur: NEGATIVE
NITRITE: NEGATIVE
PH: 6 (ref 5.0–8.0)
Specific Gravity, Urine: <=1.005 — AB (ref 1.000–1.030)
TOTAL PROTEIN, URINE-UPE24: NEGATIVE
UROBILINOGEN UA: 0.2 (ref 0.0–1.0)
Urine Glucose: NEGATIVE

## 2016-10-20 LAB — LIPID PANEL
CHOLESTEROL: 186 mg/dL (ref 0–200)
HDL: 73.3 mg/dL (ref >39.00)
LDL CALC: 103 mg/dL — AB (ref 0–99)
NonHDL: 112.55
TRIGLYCERIDES: 47 mg/dL (ref 0.0–149.0)
Total CHOL/HDL Ratio: 3
VLDL: 9.4 mg/dL (ref 0.0–40.0)

## 2016-10-20 LAB — TSH: TSH: 1.59 u[IU]/mL (ref 0.35–4.50)

## 2016-10-20 LAB — PROTIME-INR
INR: 1.1 ratio — AB (ref 0.8–1.0)
Prothrombin Time: 12 s (ref 9.6–13.1)

## 2016-11-13 ENCOUNTER — Encounter: Payer: Self-pay | Admitting: Internal Medicine

## 2016-11-13 ENCOUNTER — Ambulatory Visit (INDEPENDENT_AMBULATORY_CARE_PROVIDER_SITE_OTHER): Payer: BLUE CROSS/BLUE SHIELD | Admitting: Internal Medicine

## 2016-11-13 VITALS — BP 122/64 | HR 83 | Temp 98.2°F | Ht 64.5 in | Wt 134.0 lb

## 2016-11-13 DIAGNOSIS — F411 Generalized anxiety disorder: Secondary | ICD-10-CM | POA: Diagnosis not present

## 2016-11-13 DIAGNOSIS — Z23 Encounter for immunization: Secondary | ICD-10-CM

## 2016-11-13 MED ORDER — AMPHETAMINE-DEXTROAMPHETAMINE 20 MG PO TABS
20.0000 mg | ORAL_TABLET | Freq: Two times a day (BID) | ORAL | 0 refills | Status: DC
Start: 2016-11-13 — End: 2017-02-11

## 2016-11-13 MED ORDER — AMPHETAMINE-DEXTROAMPHETAMINE 20 MG PO TABS: 20.0000 mg | ORAL_TABLET | Freq: Two times a day (BID) | ORAL | 0 refills | Status: DC

## 2016-11-13 MED ORDER — SUMATRIPTAN SUCCINATE 50 MG PO TABS: ORAL_TABLET | ORAL | 5 refills | Status: DC

## 2016-11-13 NOTE — Assessment & Plan Note (Signed)
Xanax prn  Potential benefits of a long term benzodiazepines  use as well as potential risks  and complications were explained to the patient and were aknowledged. 

## 2016-11-13 NOTE — Progress Notes (Signed)
Subjective:  Patient ID: Heather Freeman, female    DOB: 12-03-1965  Age: 51 y.o. MRN: [id number]  CC: No chief complaint on file.   HPI Heather Freeman presents for ADD f/u  Outpatient Medications Prior to Visit  Medication Sig Dispense Refill  . cephALEXin (KEFLEX) 500 MG capsule Take 1 capsule (500 mg total) by mouth 4 (four) times daily. 16 capsule 0  . Cholecalciferol (VITAMIN D3) 1000 UNITS tablet Take 1,000 Units by mouth daily.      . ciprofloxacin (CIPRO) 250 MG tablet Take 1 tablet (250 mg total) by mouth 2 (two) times daily. 6 tablet 0  . clindamycin (CLEOCIN T) 1 % lotion Apply 1 application topically 2 (two) times daily. On face.     . doxycycline (VIBRA-TABS) 100 MG tablet Take 1 tablet (100 mg total) by mouth 2 (two) times daily. 14 tablet 0  . estradiol (ESTRACE) 1 MG tablet Take 1 tablet (1 mg total) by mouth daily. 90 tablet 4  . estrogen, conjugated,-medroxyprogesterone (PREMPRO) 0.625-5 MG tablet Take 1 tablet by mouth daily. 30 tablet 3  . loratadine (CLARITIN) 10 MG tablet Take 10 mg by mouth daily as needed. For allergies.     . montelukast (SINGULAIR) 10 MG tablet Take 1 tablet (10 mg total) by mouth daily. 90 tablet 3  . phenazopyridine (PYRIDIUM) 200 MG tablet Take 1 tablet (200 mg total) by mouth 3 (three) times daily as needed for pain. 9 tablet 0  . progesterone (PROMETRIUM) 200 MG capsule Take 1 capsule (200 mg total) by mouth daily. Take one tablet the first 12 days of the month 40 capsule 4  . SUMAtriptan (IMITREX) 50 MG tablet TAKE 1 TABLET AT ONSET OF HEADACHE. MAY REPEAT A SECOND DOSE IN 2 HOURS. NOT TO EXCEED 2 TABLETS IN 24 HOURS 12 tablet 5  . triamcinolone cream (KENALOG) 0.5 % Apply topically 2 (two) times daily. 454 g 1  . amphetamine-dextroamphetamine (ADDERALL) 20 MG tablet Take 1 tablet (20 mg total) by mouth 2 (two) times daily. Please fill on or after 10/21/16 60 tablet 0   No facility-administered medications prior to visit.      ROS Review of Systems  Constitutional: Negative for activity change, appetite change, chills, fatigue and unexpected weight change.  HENT: Negative for congestion, mouth sores and sinus pressure.   Eyes: Negative for visual disturbance.  Respiratory: Negative for cough and chest tightness.   Gastrointestinal: Negative for abdominal pain and nausea.  Genitourinary: Negative for difficulty urinating, frequency and vaginal pain.  Musculoskeletal: Negative for back pain and gait problem.  Skin: Negative for pallor and rash.  Neurological: Negative for dizziness, tremors, weakness, numbness and headaches.  Psychiatric/Behavioral: Positive for decreased concentration. Negative for confusion and sleep disturbance.    Objective:  BP 122/64 (BP Location: Left Arm, Patient Position: Sitting, Cuff Size: Normal)   Pulse 83   Temp 98.2 F (36.8 C) (Oral)   Ht 5' 4.5" (1.638 m)   Wt 134 lb (60.8 kg)   SpO2 99%   BMI 22.65 kg/m   BP Readings from Last 3 Encounters:  11/13/16 122/64  07/31/16 116/80  06/02/16 118/76    Wt Readings from Last 3 Encounters:  11/13/16 134 lb (60.8 kg)  07/31/16 146 lb (66.2 kg)  06/02/16 150 lb (68 kg)    Physical Exam  Constitutional: She appears well-developed. No distress.  HENT:  Head: Normocephalic.  Right Ear: External ear normal.  Left Ear: External ear normal.  Nose:  Nose normal.  Mouth/Throat: Oropharynx is clear and moist.  Eyes: Pupils are equal, round, and reactive to light. Conjunctivae are normal. Right eye exhibits no discharge. Left eye exhibits no discharge.  Neck: Normal range of motion. Neck supple. No JVD present. No tracheal deviation present. No thyromegaly present.  Cardiovascular: Normal rate, regular rhythm and normal heart sounds.   Pulmonary/Chest: No stridor. No respiratory distress. She has no wheezes.  Abdominal: Soft. Bowel sounds are normal. She exhibits no distension and no mass. There is no tenderness. There is no  rebound and no guarding.  Musculoskeletal: She exhibits no edema or tenderness.  Lymphadenopathy:    She has no cervical adenopathy.  Neurological: She displays normal reflexes. No cranial nerve deficit. She exhibits normal muscle tone. Coordination normal.  Skin: No rash noted. No erythema.  Psychiatric: She has a normal mood and affect. Her behavior is normal. Judgment and thought content normal.    Lab Results  Component Value Date   WBC 7.9 10/20/2016   HGB 13.5 10/20/2016   HCT 39.8 10/20/2016   PLT 389.0 10/20/2016   GLUCOSE 111 (H) 10/20/2016   CHOL 186 10/20/2016   TRIG 47.0 10/20/2016   HDL 73.30 10/20/2016   LDLDIRECT 101.3 11/09/2012   LDLCALC 103 (H) 10/20/2016   ALT 10 10/20/2016   AST 17 10/20/2016   NA 137 10/20/2016   K 3.9 10/20/2016   CL 104 10/20/2016   CREATININE 0.73 10/20/2016   BUN 10 10/20/2016   CO2 27 10/20/2016   TSH 1.59 10/20/2016   INR 1.1 (H) 10/20/2016    US Breast Ltd Uni Right Inc Axilla  Result Date: 11/30/2015 CLINICAL DATA:  Patient recalled from screening for right breast asymmetry. EXAM: 2D DIGITAL DIAGNOSTIC RIGHT MAMMOGRAM WITH CAD AND ADJUNCT TOMO ULTRASOUND RIGHT BREAST COMPARISON:  Previous exam(s). ACR Breast Density Category c: The breast tissue is heterogeneously dense, which may obscure small masses. FINDINGS: Questioned asymmetry within the medial right breast predominantly resolved with additional imaging including spot compression views. Residual density is located in this region. Mammographic images were processed with CAD. On physical exam, I palpate no discrete mass within the medial right breast. Targeted ultrasound is performed, showing a 7 x 8 x 3 mm cluster of cysts within the right breast 5 o'clock position 3 cm from the nipple. IMPRESSION: Benign cluster of cysts right breast. No mammographic evidence for malignancy. RECOMMENDATION: Screening mammogram in one year.(Code:SM-B-01Y) I have discussed the findings and  recommendations with the patient. Results were also provided in writing at the conclusion of the visit. If applicable, a reminder letter will be sent to the patient regarding the next appointment. BI-RADS CATEGORY  2: Benign. Electronically Signed   By: Annia Belt M.D.   On: 11/30/2015 13:25   Mm Diag Breast Tomo Uni Right  Result Date: 11/30/2015 CLINICAL DATA:  Patient recalled from screening for right breast asymmetry. EXAM: 2D DIGITAL DIAGNOSTIC RIGHT MAMMOGRAM WITH CAD AND ADJUNCT TOMO ULTRASOUND RIGHT BREAST COMPARISON:  Previous exam(s). ACR Breast Density Category c: The breast tissue is heterogeneously dense, which may obscure small masses. FINDINGS: Questioned asymmetry within the medial right breast predominantly resolved with additional imaging including spot compression views. Residual density is located in this region. Mammographic images were processed with CAD. On physical exam, I palpate no discrete mass within the medial right breast. Targeted ultrasound is performed, showing a 7 x 8 x 3 mm cluster of cysts within the right breast 5 o'clock position 3 cm from the  nipple. IMPRESSION: Benign cluster of cysts right breast. No mammographic evidence for malignancy. RECOMMENDATION: Screening mammogram in one year.(Code:SM-B-01Y) I have discussed the findings and recommendations with the patient. Results were also provided in writing at the conclusion of the visit. If applicable, a reminder letter will be sent to the patient regarding the next appointment. BI-RADS CATEGORY  2: Benign. Electronically Signed   By: Annia Beltrew  Davis M.D.   On: 11/30/2015 13:25    Assessment & Plan:   There are no diagnoses linked to this encounter. I am having Ms. Jaros maintain her clindamycin, loratadine, cholecalciferol, progesterone, estradiol, ciprofloxacin, phenazopyridine, cephALEXin, SUMAtriptan, triamcinolone cream, doxycycline, estrogen (conjugated)-medroxyprogesterone, amphetamine-dextroamphetamine, and  montelukast.  No orders of the defined types were placed in this encounter.    Follow-up: No Follow-up on file.  Sonda PrimesAlex Plotnikov, MD

## 2016-11-13 NOTE — Assessment & Plan Note (Signed)
On Adderall  Potential benefits of a long term amphetamines  use as well as potential risks  and complications were explained to the patient and were aknowledged. 

## 2016-11-24 ENCOUNTER — Other Ambulatory Visit: Payer: Self-pay | Admitting: Internal Medicine

## 2017-01-15 ENCOUNTER — Encounter: Payer: Self-pay | Admitting: Internal Medicine

## 2017-01-16 ENCOUNTER — Encounter: Payer: Self-pay | Admitting: Internal Medicine

## 2017-01-30 ENCOUNTER — Other Ambulatory Visit: Payer: Self-pay | Admitting: Internal Medicine

## 2017-01-30 DIAGNOSIS — Z1231 Encounter for screening mammogram for malignant neoplasm of breast: Secondary | ICD-10-CM

## 2017-02-11 ENCOUNTER — Ambulatory Visit: Payer: BLUE CROSS/BLUE SHIELD | Admitting: Internal Medicine

## 2017-02-11 ENCOUNTER — Encounter: Payer: Self-pay | Admitting: Internal Medicine

## 2017-02-11 DIAGNOSIS — G43909 Migraine, unspecified, not intractable, without status migrainosus: Secondary | ICD-10-CM | POA: Insufficient documentation

## 2017-02-11 DIAGNOSIS — F411 Generalized anxiety disorder: Secondary | ICD-10-CM | POA: Diagnosis not present

## 2017-02-11 DIAGNOSIS — G43109 Migraine with aura, not intractable, without status migrainosus: Secondary | ICD-10-CM

## 2017-02-11 DIAGNOSIS — F900 Attention-deficit hyperactivity disorder, predominantly inattentive type: Secondary | ICD-10-CM

## 2017-02-11 DIAGNOSIS — R21 Rash and other nonspecific skin eruption: Secondary | ICD-10-CM | POA: Diagnosis not present

## 2017-02-11 MED ORDER — AMPHETAMINE-DEXTROAMPHETAMINE 20 MG PO TABS: 20.0000 mg | ORAL_TABLET | Freq: Two times a day (BID) | ORAL | 0 refills | Status: DC

## 2017-02-11 MED ORDER — TRIAMCINOLONE ACETONIDE 0.1 % EX CREA: 1.0000 "application " | TOPICAL_CREAM | Freq: Two times a day (BID) | CUTANEOUS | 3 refills | Status: DC

## 2017-02-11 MED ORDER — SUMATRIPTAN SUCCINATE 50 MG PO TABS: ORAL_TABLET | ORAL | 5 refills | Status: DC

## 2017-02-11 NOTE — Patient Instructions (Signed)
Plantar Fasciitis Rehab  Ask your health care provider which exercises are safe for you. Do exercises exactly as told by your health care provider and adjust them as directed. It is normal to feel mild stretching, pulling, tightness, or discomfort as you do these exercises, but you should stop right away if you feel sudden pain or your pain gets worse. Do not begin these exercises until told by your health care provider.  Stretching and range of motion exercises  These exercises warm up your muscles and joints and improve the movement and flexibility of your foot. These exercises also help to relieve pain.  Exercise A: Plantar fascia stretch    1. Sit with your left / right leg crossed over your opposite knee.  2. Hold your heel with one hand with that thumb near your arch. With your other hand, hold your toes and gently pull them back toward the top of your foot. You should feel a stretch on the bottom of your toes or your foot or both.  3. Hold this stretch for__________ seconds.  4. Slowly release your toes and return to the starting position.  Repeat __________ times. Complete this exercise __________ times a day.  Exercise B: Gastroc, standing    1. Stand with your hands against a wall.  2. Extend your left / right leg behind you, and bend your front knee slightly.  3. Keeping your heels on the floor and keeping your back knee straight, shift your weight toward the wall without arching your back. You should feel a gentle stretch in your left / right calf.  4. Hold this position for __________ seconds.  Repeat __________ times. Complete this exercise __________ times a day.  Exercise C: Soleus, standing  1. Stand with your hands against a wall.  2. Extend your left / right leg behind you, and bend your front knee slightly.  3. Keeping your heels on the floor, bend your back knee and slightly shift your weight over the back leg. You should feel a gentle stretch deep in your calf.  4. Hold this position for  __________ seconds.  Repeat __________ times. Complete this exercise __________ times a day.  Exercise D: Gastrocsoleus, standing  1. Stand with the ball of your left / right foot on a step. The ball of your foot is on the walking surface, right under your toes.  2. Keep your other foot firmly on the same step.  3. Hold onto the wall or a railing for balance.  4. Slowly lift your other foot, allowing your body weight to press your heel down over the edge of the step. You should feel a stretch in your left / right calf.  5. Hold this position for __________ seconds.  6. Return both feet to the step.  7. Repeat this exercise with a slight bend in your left / right knee.  Repeat __________ times with your left / right knee straight and __________ times with your left / right knee bent. Complete this exercise __________ times a day.  Balance exercise  This exercise builds your balance and strength control of your arch to help take pressure off your plantar fascia.  Exercise E: Single leg stand  1. Without shoes, stand near a railing or in a doorway. You may hold onto the railing or door frame as needed.  2. Stand on your left / right foot. Keep your big toe down on the floor and try to keep your arch lifted. Do not   let your foot roll inward.  3. Hold this position for __________ seconds.  4. If this exercise is too easy, you can try it with your eyes closed or while standing on a pillow.  Repeat __________ times. Complete this exercise __________ times a day.  This information is not intended to replace advice given to you by your health care provider. Make sure you discuss any questions you have with your health care provider.  Document Released: 12/30/2004 Document Revised: 09/04/2015 Document Reviewed: 11/13/2014  Elsevier Interactive Patient Education  2018 [employer]      Plantar Fasciitis  Plantar fasciitis is a painful foot condition that affects the heel. It occurs when the band of tissue that connects the  toes to the heel bone (plantar fascia) becomes irritated. This can happen after exercising too much or doing other repetitive activities (overuse injury). The pain from plantar fasciitis can range from mild irritation to severe pain that makes it difficult for you to walk or move. The pain is usually worse in the morning or after you have been sitting or lying down for a while.  What are the causes?  This condition may be caused by:   Standing for long periods of time.   Wearing shoes that do not fit.   Doing high-impact activities, including running, aerobics, and ballet.   Being overweight.   Having an abnormal way of walking (gait).   Having tight calf muscles.   Having high arches in your feet.   Starting a new athletic activity.    What are the signs or symptoms?  The main symptom of this condition is heel pain. Other symptoms include:   Pain that gets worse after activity or exercise.   Pain that is worse in the morning or after resting.   Pain that goes away after you walk for a few minutes.    How is this diagnosed?  This condition may be diagnosed based on your signs and symptoms. Your health care provider will also do a physical exam to check for:   A tender area on the bottom of your foot.   A high arch in your foot.   Pain when you move your foot.   Difficulty moving your foot.    You may also need to have imaging studies to confirm the diagnosis. These can include:   X-rays.   Ultrasound.   MRI.    How is this treated?  Treatment for plantar fasciitis depends on the severity of the condition. Your treatment may include:   Rest, ice, and over-the-counter pain medicines to manage your pain.   Exercises to stretch your calves and your plantar fascia.   A splint that holds your foot in a stretched, upward position while you sleep (night splint).   Physical therapy to relieve symptoms and prevent problems in the future.   Cortisone injections to relieve severe pain.   Extracorporeal  shock wave therapy (ESWT) to stimulate damaged plantar fascia with electrical impulses. It is often used as a last resort before surgery.   Surgery, if other treatments have not worked after 12 months.    Follow these instructions at home:   Take medicines only as directed by your health care provider.   Avoid activities that cause pain.   Roll the bottom of your foot over a bag of ice or a bottle of cold water. Do this for 20 minutes, 3-4 times a day.   Perform simple stretches as directed by your health   care provider.   Try wearing athletic shoes with air-sole or gel-sole cushions or soft shoe inserts.   Wear a night splint while sleeping, if directed by your health care provider.   Keep all follow-up appointments with your health care provider.  How is this prevented?   Do not perform exercises or activities that cause heel pain.   Consider finding low-impact activities if you continue to have problems.   Lose weight if you need to.  The best way to prevent plantar fasciitis is to avoid the activities that aggravate your plantar fascia.  Contact a health care provider if:   Your symptoms do not go away after treatment with home care measures.   Your pain gets worse.   Your pain affects your ability to move or do your daily activities.  This information is not intended to replace advice given to you by your health care provider. Make sure you discuss any questions you have with your health care provider.  Document Released: 09/24/2000 Document Revised: 06/04/2015 Document Reviewed: 11/09/2013  Elsevier Interactive Patient Education  2018 [employer]

## 2017-02-11 NOTE — Assessment & Plan Note (Signed)
Adderall Rx

## 2017-02-11 NOTE — Assessment & Plan Note (Signed)
Xanax prn rare use  Potential benefits of a long term benzodiazepines  use as well as potential risks  and complications were explained to the patient and were aknowledged.  

## 2017-02-11 NOTE — Progress Notes (Signed)
Subjective:  Patient ID: Heather Freeman, female    DOB: 07-18-1965  Age: 52 y.o. MRN: [id number]  CC: No chief complaint on file.   HPI Heather Freeman presents for ADD, HAs, allergies f/u  Outpatient Medications Prior to Visit  Medication Sig Dispense Refill  . amphetamine-dextroamphetamine (ADDERALL) 20 MG tablet Take 1 tablet (20 mg total) by mouth 2 (two) times daily. Please fill on or after 01/21/17 60 tablet 0  . Cholecalciferol (VITAMIN D3) 1000 UNITS tablet Take 1,000 Units by mouth daily.      Marland Kitchen. loratadine (CLARITIN) 10 MG tablet Take 10 mg by mouth daily as needed. For allergies.     . montelukast (SINGULAIR) 10 MG tablet Take 1 tablet (10 mg total) by mouth daily. 90 tablet 3  . PREMPRO 0.625-5 MG tablet TAKE 1 TABLET BY MOUTH EVERY DAY 28 tablet 5  . progesterone (PROMETRIUM) 200 MG capsule Take 1 capsule (200 mg total) by mouth daily. Take one tablet the first 12 days of the month 40 capsule 4  . SUMAtriptan (IMITREX) 50 MG tablet TAKE 1 TABLET AT ONSET OF HEADACHE. MAY REPEAT A SECOND DOSE IN 2 HOURS. NOT TO EXCEED 2 TABLETS IN 24 HOURS 12 tablet 5  . triamcinolone cream (KENALOG) 0.5 % Apply topically 2 (two) times daily. 454 g 1  . estradiol (ESTRACE) 1 MG tablet Take 1 tablet (1 mg total) by mouth daily. 90 tablet 4  . phenazopyridine (PYRIDIUM) 200 MG tablet Take 1 tablet (200 mg total) by mouth 3 (three) times daily as needed for pain. 9 tablet 0  . cephALEXin (KEFLEX) 500 MG capsule Take 1 capsule (500 mg total) by mouth 4 (four) times daily. 16 capsule 0  . ciprofloxacin (CIPRO) 250 MG tablet Take 1 tablet (250 mg total) by mouth 2 (two) times daily. 6 tablet 0  . clindamycin (CLEOCIN T) 1 % lotion Apply 1 application topically 2 (two) times daily. On face.     . doxycycline (VIBRA-TABS) 100 MG tablet Take 1 tablet (100 mg total) by mouth 2 (two) times daily. 14 tablet 0   No facility-administered medications prior to visit.     ROS Review of Systems    Constitutional: Negative for activity change, appetite change, chills, fatigue and unexpected weight change.  HENT: Negative for congestion, mouth sores and sinus pressure.   Eyes: Negative for visual disturbance.  Respiratory: Negative for cough and chest tightness.   Gastrointestinal: Negative for abdominal pain and nausea.  Genitourinary: Negative for difficulty urinating, frequency and vaginal pain.  Musculoskeletal: Negative for back pain and gait problem.  Skin: Negative for pallor and rash.  Neurological: Negative for dizziness, tremors, weakness, numbness and headaches.  Psychiatric/Behavioral: Positive for decreased concentration. Negative for confusion and sleep disturbance. The patient is not nervous/anxious.     Objective:  BP 124/72 (BP Location: Left Arm, Patient Position: Sitting, Cuff Size: Normal)   Pulse 96   Temp 98.2 F (36.8 C) (Oral)   Ht 5' 4.5" (1.638 m)   Wt 135 lb (61.2 kg)   SpO2 99%   BMI 22.81 kg/m   BP Readings from Last 3 Encounters:  02/11/17 124/72  11/13/16 122/64  07/31/16 116/80    Wt Readings from Last 3 Encounters:  02/11/17 135 lb (61.2 kg)  11/13/16 134 lb (60.8 kg)  07/31/16 146 lb (66.2 kg)    Physical Exam  Constitutional: She appears well-developed. No distress.  HENT:  Head: Normocephalic.  Right Ear: External ear normal.  Left Ear: External ear normal.  Nose: Nose normal.  Mouth/Throat: Oropharynx is clear and moist.  Eyes: Conjunctivae are normal. Pupils are equal, round, and reactive to light. Right eye exhibits no discharge. Left eye exhibits no discharge.  Neck: Normal range of motion. Neck supple. No JVD present. No tracheal deviation present. No thyromegaly present.  Cardiovascular: Normal rate, regular rhythm and normal heart sounds.  Pulmonary/Chest: No stridor. No respiratory distress. She has no wheezes.  Abdominal: Soft. Bowel sounds are normal. She exhibits no distension and no mass. There is no tenderness.  There is no rebound and no guarding.  Musculoskeletal: She exhibits no edema or tenderness.  Lymphadenopathy:    She has no cervical adenopathy.  Neurological: She displays normal reflexes. No cranial nerve deficit. She exhibits normal muscle tone. Coordination normal.  Skin: No rash noted. No erythema.  Psychiatric: She has a normal mood and affect. Her behavior is normal. Judgment and thought content normal.    Lab Results  Component Value Date   WBC 7.9 10/20/2016   HGB 13.5 10/20/2016   HCT 39.8 10/20/2016   PLT 389.0 10/20/2016   GLUCOSE 111 (H) 10/20/2016   CHOL 186 10/20/2016   TRIG 47.0 10/20/2016   HDL 73.30 10/20/2016   LDLDIRECT 101.3 11/09/2012   LDLCALC 103 (H) 10/20/2016   ALT 10 10/20/2016   AST 17 10/20/2016   NA 137 10/20/2016   K 3.9 10/20/2016   CL 104 10/20/2016   CREATININE 0.73 10/20/2016   BUN 10 10/20/2016   CO2 27 10/20/2016   TSH 1.59 10/20/2016   INR 1.1 (H) 10/20/2016    US Breast Ltd Uni Right Inc Axilla  Result Date: 11/30/2015 CLINICAL DATA:  Patient recalled from screening for right breast asymmetry. EXAM: 2D DIGITAL DIAGNOSTIC RIGHT MAMMOGRAM WITH CAD AND ADJUNCT TOMO ULTRASOUND RIGHT BREAST COMPARISON:  Previous exam(s). ACR Breast Density Category c: The breast tissue is heterogeneously dense, which may obscure small masses. FINDINGS: Questioned asymmetry within the medial right breast predominantly resolved with additional imaging including spot compression views. Residual density is located in this region. Mammographic images were processed with CAD. On physical exam, I palpate no discrete mass within the medial right breast. Targeted ultrasound is performed, showing a 7 x 8 x 3 mm cluster of cysts within the right breast 5 o'clock position 3 cm from the nipple. IMPRESSION: Benign cluster of cysts right breast. No mammographic evidence for malignancy. RECOMMENDATION: Screening mammogram in one year.(Code:SM-B-01Y) I have discussed the findings  and recommendations with the patient. Results were also provided in writing at the conclusion of the visit. If applicable, a reminder letter will be sent to the patient regarding the next appointment. BI-RADS CATEGORY  2: Benign. Electronically Signed   By: Annia Belt M.D.   On: 11/30/2015 13:25   Mm Diag Breast Tomo Uni Right  Result Date: 11/30/2015 CLINICAL DATA:  Patient recalled from screening for right breast asymmetry. EXAM: 2D DIGITAL DIAGNOSTIC RIGHT MAMMOGRAM WITH CAD AND ADJUNCT TOMO ULTRASOUND RIGHT BREAST COMPARISON:  Previous exam(s). ACR Breast Density Category c: The breast tissue is heterogeneously dense, which may obscure small masses. FINDINGS: Questioned asymmetry within the medial right breast predominantly resolved with additional imaging including spot compression views. Residual density is located in this region. Mammographic images were processed with CAD. On physical exam, I palpate no discrete mass within the medial right breast. Targeted ultrasound is performed, showing a 7 x 8 x 3 mm cluster of cysts within the right breast 5  o'clock position 3 cm from the nipple. IMPRESSION: Benign cluster of cysts right breast. No mammographic evidence for malignancy. RECOMMENDATION: Screening mammogram in one year.(Code:SM-B-01Y) I have discussed the findings and recommendations with the patient. Results were also provided in writing at the conclusion of the visit. If applicable, a reminder letter will be sent to the patient regarding the next appointment. BI-RADS CATEGORY  2: Benign. Electronically Signed   By: Annia Belt M.D.   On: 11/30/2015 13:25    Assessment & Plan:     Follow-up: No Follow-up on file.  Sonda Primes, MD

## 2017-02-11 NOTE — Assessment & Plan Note (Signed)
Dry skin - hands. Triamc 0.1% in a jar

## 2017-02-11 NOTE — Assessment & Plan Note (Signed)
Imitrex prn 

## 2017-02-19 ENCOUNTER — Ambulatory Visit
Admission: RE | Admit: 2017-02-19 | Discharge: 2017-02-19 | Disposition: A | Discharge status: Home / Self Care | Payer: BLUE CROSS/BLUE SHIELD | Source: Ambulatory Visit | Attending: Internal Medicine | Admitting: Internal Medicine

## 2017-02-19 DIAGNOSIS — Z1231 Encounter for screening mammogram for malignant neoplasm of breast: Secondary | ICD-10-CM

## 2017-05-05 ENCOUNTER — Telehealth: Payer: Self-pay | Admitting: Internal Medicine

## 2017-05-05 DIAGNOSIS — Z1211 Encounter for screening for malignant neoplasm of colon: Secondary | ICD-10-CM

## 2017-05-05 NOTE — Telephone Encounter (Signed)
Pt was seen on 02/11/2017. Last cologuard was negative.  Okay to reorder if PCP approves.

## 2017-05-05 NOTE — Telephone Encounter (Signed)
Last Cologurad: 09/28/2013 LOV: 02/11/2017 Next appt: Not Sch'ed Would you like to reorder cologuard?

## 2017-05-08 NOTE — Telephone Encounter (Signed)
Ok to order if ok w/Shimeka Thx

## 2017-05-20 NOTE — Telephone Encounter (Signed)
ordered

## 2017-07-29 ENCOUNTER — Other Ambulatory Visit: Payer: Self-pay | Admitting: Internal Medicine

## 2017-08-25 ENCOUNTER — Ambulatory Visit: Payer: BLUE CROSS/BLUE SHIELD | Admitting: Internal Medicine

## 2017-08-25 ENCOUNTER — Encounter: Payer: Self-pay | Admitting: Internal Medicine

## 2017-08-25 VITALS — BP 122/70 | HR 84 | Temp 99.1°F | Ht 64.5 in | Wt 136.0 lb

## 2017-08-25 DIAGNOSIS — Z0000 Encounter for general adult medical examination without abnormal findings: Secondary | ICD-10-CM

## 2017-08-25 DIAGNOSIS — F4321 Adjustment disorder with depressed mood: Secondary | ICD-10-CM

## 2017-08-25 DIAGNOSIS — Z634 Disappearance and death of family member: Secondary | ICD-10-CM

## 2017-08-25 DIAGNOSIS — G43109 Migraine with aura, not intractable, without status migrainosus: Secondary | ICD-10-CM | POA: Diagnosis not present

## 2017-08-25 DIAGNOSIS — F900 Attention-deficit hyperactivity disorder, predominantly inattentive type: Secondary | ICD-10-CM | POA: Diagnosis not present

## 2017-08-25 MED ORDER — AMPHETAMINE-DEXTROAMPHETAMINE 20 MG PO TABS
20.0000 mg | ORAL_TABLET | Freq: Two times a day (BID) | ORAL | 0 refills | Status: DC
Start: 2017-08-25 — End: 2017-08-25

## 2017-08-25 MED ORDER — AMPHETAMINE-DEXTROAMPHETAMINE 20 MG PO TABS: 20.0000 mg | ORAL_TABLET | Freq: Two times a day (BID) | ORAL | 0 refills | Status: DC

## 2017-08-25 NOTE — Assessment & Plan Note (Addendum)
7/19 sister died - discussed CAD on the death certificate

## 2017-08-25 NOTE — Assessment & Plan Note (Signed)
On Adderall  Potential benefits of a long term amphetamines  use as well as potential risks  and complications were explained to the patient and were aknowledged. 

## 2017-08-25 NOTE — Assessment & Plan Note (Signed)
Imitrex prn 

## 2017-08-25 NOTE — Progress Notes (Signed)
Subjective:  Patient ID: Heather Freeman, female    DOB: 1965/02/27  Age: 52 y.o. MRN: [id number]  CC: No chief complaint on file.   HPI Heather Freeman presents for ADD, HAs Sister just died of ?CAD vs other - grieving  Outpatient Medications Prior to Visit  Medication Sig Dispense Refill  . Cholecalciferol (VITAMIN D3) 1000 UNITS tablet Take 1,000 Units by mouth daily.      Marland Kitchen. loratadine (CLARITIN) 10 MG tablet Take 10 mg by mouth daily as needed. For allergies.     . montelukast (SINGULAIR) 10 MG tablet TAKE 1 TABLET BY MOUTH EVERY DAY 90 tablet 3  . PREMPRO 0.625-5 MG tablet TAKE 1 TABLET BY MOUTH EVERY DAY 28 tablet 5  . progesterone (PROMETRIUM) 200 MG capsule Take 1 capsule (200 mg total) by mouth daily. Take one tablet the first 12 days of the month 40 capsule 4  . SUMAtriptan (IMITREX) 50 MG tablet TAKE 1 TABLET AT ONSET OF HEADACHE. MAY REPEAT A SECOND DOSE IN 2 HOURS. NOT TO EXCEED 2 TABLETS IN 24 HOURS 12 tablet 5  . triamcinolone cream (KENALOG) 0.1 % Apply 1 application topically 2 (two) times daily. In a jar 450 g 3  . amphetamine-dextroamphetamine (ADDERALL) 20 MG tablet Take 1 tablet (20 mg total) by mouth 2 (two) times daily. Please fill on or after 04/21/17 60 tablet 0   No facility-administered medications prior to visit.     ROS: Review of Systems  Constitutional: Negative for activity change, appetite change, chills, fatigue and unexpected weight change.  HENT: Negative for congestion, mouth sores and sinus pressure.   Eyes: Negative for visual disturbance.  Respiratory: Negative for cough and chest tightness.   Gastrointestinal: Negative for abdominal pain and nausea.  Genitourinary: Negative for difficulty urinating, frequency and vaginal pain.  Musculoskeletal: Negative for back pain and gait problem.  Skin: Negative for pallor and rash.  Neurological: Negative for dizziness, tremors, weakness, numbness and headaches.  Psychiatric/Behavioral: Positive for  decreased concentration and dysphoric mood. Negative for confusion, sleep disturbance and suicidal ideas. The patient is nervous/anxious.     Objective:  BP 122/70 (BP Location: Left Arm, Patient Position: Sitting, Cuff Size: Normal)   Pulse 84   Temp 99.1 F (37.3 C) (Oral)   Ht 5' 4.5" (1.638 m)   Wt 136 lb (61.7 kg)   SpO2 98%   BMI 22.98 kg/m   BP Readings from Last 3 Encounters:  08/25/17 122/70  02/11/17 124/72  11/13/16 122/64    Wt Readings from Last 3 Encounters:  08/25/17 136 lb (61.7 kg)  02/11/17 135 lb (61.2 kg)  11/13/16 134 lb (60.8 kg)    Physical Exam  Constitutional: She appears well-developed. No distress.  HENT:  Head: Normocephalic.  Right Ear: External ear normal.  Left Ear: External ear normal.  Nose: Nose normal.  Mouth/Throat: Oropharynx is clear and moist.  Eyes: Pupils are equal, round, and reactive to light. Conjunctivae are normal. Right eye exhibits no discharge. Left eye exhibits no discharge.  Neck: Normal range of motion. Neck supple. No JVD present. No tracheal deviation present. No thyromegaly present.  Cardiovascular: Normal rate, regular rhythm and normal heart sounds.  Pulmonary/Chest: No stridor. No respiratory distress. She has no wheezes.  Abdominal: Soft. Bowel sounds are normal. She exhibits no distension and no mass. There is no tenderness. There is no rebound and no guarding.  Musculoskeletal: She exhibits no edema or tenderness.  Lymphadenopathy:    She has  no cervical adenopathy.  Neurological: She displays normal reflexes. No cranial nerve deficit. She exhibits normal muscle tone. Coordination normal.  Skin: No rash noted. No erythema.  Psychiatric: She has a normal mood and affect. Her behavior is normal. Judgment and thought content normal.  sad   Lab Results  Component Value Date   WBC 7.9 10/20/2016   HGB 13.5 10/20/2016   HCT 39.8 10/20/2016   PLT 389.0 10/20/2016   GLUCOSE 111 (H) 10/20/2016   CHOL 186  10/20/2016   TRIG 47.0 10/20/2016   HDL 73.30 10/20/2016   LDLDIRECT 101.3 11/09/2012   LDLCALC 103 (H) 10/20/2016   ALT 10 10/20/2016   AST 17 10/20/2016   NA 137 10/20/2016   K 3.9 10/20/2016   CL 104 10/20/2016   CREATININE 0.73 10/20/2016   BUN 10 10/20/2016   CO2 27 10/20/2016   TSH 1.59 10/20/2016   INR 1.1 (H) 10/20/2016    Mm Screening Breast Tomo Bilateral  Result Date: 02/19/2017 CLINICAL DATA:  Screening. EXAM: DIGITAL SCREENING BILATERAL MAMMOGRAM WITH TOMO AND CAD COMPARISON:  Previous exam(s). ACR Breast Density Category c: The breast tissue is heterogeneously dense, which may obscure small masses. FINDINGS: There are no findings suspicious for malignancy. Waxing and waning circumscribed equal density masses are consistent with a changing cystic pattern. Images were processed with CAD. IMPRESSION: No mammographic evidence of malignancy. A result letter of this screening mammogram will be mailed directly to the patient. RECOMMENDATION: Screening mammogram in one year. (Code:SM-B-01Y) BI-RADS CATEGORY  2: Benign. Electronically Signed   By: Sande BrothersSerena  Chacko M.D.   On: 02/19/2017 09:22    Assessment & Plan:   There are no diagnoses linked to this encounter.   No orders of the defined types were placed in this encounter.    Follow-up: No follow-ups on file.  Sonda PrimesAlex Belkys Henault, MD

## 2017-09-10 ENCOUNTER — Other Ambulatory Visit: Payer: Self-pay | Admitting: Internal Medicine

## 2017-09-16 LAB — COLOGUARD: COLOGUARD: NEGATIVE

## 2017-09-17 ENCOUNTER — Encounter: Payer: Self-pay | Admitting: Internal Medicine

## 2017-11-17 ENCOUNTER — Encounter: Payer: Self-pay | Admitting: Internal Medicine

## 2017-11-17 ENCOUNTER — Ambulatory Visit (INDEPENDENT_AMBULATORY_CARE_PROVIDER_SITE_OTHER): Payer: BLUE CROSS/BLUE SHIELD | Admitting: Internal Medicine

## 2017-11-17 DIAGNOSIS — F411 Generalized anxiety disorder: Secondary | ICD-10-CM | POA: Diagnosis not present

## 2017-11-17 DIAGNOSIS — F4321 Adjustment disorder with depressed mood: Secondary | ICD-10-CM | POA: Diagnosis not present

## 2017-11-17 DIAGNOSIS — F900 Attention-deficit hyperactivity disorder, predominantly inattentive type: Secondary | ICD-10-CM

## 2017-11-17 MED ORDER — AMPHETAMINE-DEXTROAMPHETAMINE 20 MG PO TABS: 20.0000 mg | ORAL_TABLET | Freq: Two times a day (BID) | ORAL | 0 refills | Status: DC

## 2017-11-17 MED ORDER — MOMETASONE FUROATE 50 MCG/ACT NA SUSP: 2.0000 | Freq: Every day | NASAL | 12 refills | Status: DC

## 2017-11-17 NOTE — Assessment & Plan Note (Signed)
Discussed.

## 2017-11-17 NOTE — Assessment & Plan Note (Signed)
On Adderall  Potential benefits of a long term amphetamines  use as well as potential risks  and complications were explained to the patient and were aknowledged. 

## 2017-11-17 NOTE — Progress Notes (Signed)
Subjective:  Patient ID: Heather Freeman, female    DOB: 1965-04-01  Age: 52 y.o. MRN: [id number]  CC: No chief complaint on file.   HPI Heather Freeman presents for  ADD, HAs and allergies  Outpatient Medications Prior to Visit  Medication Sig Dispense Refill  . amphetamine-dextroamphetamine (ADDERALL) 20 MG tablet Take 1 tablet (20 mg total) by mouth 2 (two) times daily. Please fill on or after 10/25/17 60 tablet 0  . Cholecalciferol (VITAMIN D3) 1000 UNITS tablet Take 1,000 Units by mouth daily.      Marland Kitchen loratadine (CLARITIN) 10 MG tablet Take 10 mg by mouth daily as needed. For allergies.     . montelukast (SINGULAIR) 10 MG tablet TAKE 1 TABLET BY MOUTH EVERY DAY 90 tablet 3  . PREMPRO 0.625-5 MG tablet TAKE 1 TABLET BY MOUTH EVERY DAY 28 tablet 5  . progesterone (PROMETRIUM) 200 MG capsule Take 1 capsule (200 mg total) by mouth daily. Take one tablet the first 12 days of the month 40 capsule 4  . SUMAtriptan (IMITREX) 50 MG tablet TAKE 1 TABLET AT ONSET OF HEADACHE. MAY REPEAT A SECOND DOSE IN 2 HOURS. NOT TO EXCEED 2 TABLETS IN 24 HOURS 12 tablet 5  . triamcinolone cream (KENALOG) 0.1 % Apply 1 application topically 2 (two) times daily. In a jar 450 g 3   No facility-administered medications prior to visit.     ROS: Review of Systems  Constitutional: Negative for activity change, appetite change, chills, fatigue and unexpected weight change.  HENT: Negative for congestion, mouth sores and sinus pressure.   Eyes: Negative for visual disturbance.  Respiratory: Negative for cough and chest tightness.   Gastrointestinal: Negative for abdominal pain and nausea.  Genitourinary: Negative for difficulty urinating, frequency and vaginal pain.  Musculoskeletal: Negative for back pain and gait problem.  Skin: Negative for pallor and rash.  Neurological: Negative for dizziness, tremors, weakness, numbness and headaches.  Psychiatric/Behavioral: Negative for confusion and sleep  disturbance. The patient is not nervous/anxious.     Objective:  BP 120/78 (BP Location: Left Arm, Patient Position: Sitting, Cuff Size: Normal)   Pulse 80   Temp 98.5 F (36.9 C) (Oral)   Ht 5' 4.5" (1.638 m)   Wt 141 lb (64 kg)   SpO2 97%   BMI 23.83 kg/m   BP Readings from Last 3 Encounters:  11/17/17 120/78  08/25/17 122/70  02/11/17 124/72    Wt Readings from Last 3 Encounters:  11/17/17 141 lb (64 kg)  08/25/17 136 lb (61.7 kg)  02/11/17 135 lb (61.2 kg)    Physical Exam  Constitutional: She appears well-developed. No distress.  HENT:  Head: Normocephalic.  Right Ear: External ear normal.  Left Ear: External ear normal.  Nose: Nose normal.  Mouth/Throat: Oropharynx is clear and moist.  Eyes: Pupils are equal, round, and reactive to light. Conjunctivae are normal. Right eye exhibits no discharge. Left eye exhibits no discharge.  Neck: Normal range of motion. Neck supple. No JVD present. No tracheal deviation present. No thyromegaly present.  Cardiovascular: Normal rate, regular rhythm and normal heart sounds.  Pulmonary/Chest: No stridor. No respiratory distress. She has no wheezes.  Abdominal: Soft. Bowel sounds are normal. She exhibits no distension and no mass. There is no tenderness. There is no rebound and no guarding.  Musculoskeletal: She exhibits no edema or tenderness.  Lymphadenopathy:    She has no cervical adenopathy.  Neurological: She displays normal reflexes. No cranial nerve deficit. She  exhibits normal muscle tone. Coordination normal.  Skin: No rash noted. No erythema.  Psychiatric: She has a normal mood and affect. Her behavior is normal. Judgment and thought content normal.    Lab Results  Component Value Date   WBC 7.9 10/20/2016   HGB 13.5 10/20/2016   HCT 39.8 10/20/2016   PLT 389.0 10/20/2016   GLUCOSE 111 (H) 10/20/2016   CHOL 186 10/20/2016   TRIG 47.0 10/20/2016   HDL 73.30 10/20/2016   LDLDIRECT 101.3 11/09/2012   LDLCALC 103  (H) 10/20/2016   ALT 10 10/20/2016   AST 17 10/20/2016   NA 137 10/20/2016   K 3.9 10/20/2016   CL 104 10/20/2016   CREATININE 0.73 10/20/2016   BUN 10 10/20/2016   CO2 27 10/20/2016   TSH 1.59 10/20/2016   INR 1.1 (H) 10/20/2016    Mm Screening Breast Tomo Bilateral  Result Date: 02/19/2017 CLINICAL DATA:  Screening. EXAM: DIGITAL SCREENING BILATERAL MAMMOGRAM WITH TOMO AND CAD COMPARISON:  Previous exam(s). ACR Breast Density Category c: The breast tissue is heterogeneously dense, which may obscure small masses. FINDINGS: There are no findings suspicious for malignancy. Waxing and waning circumscribed equal density masses are consistent with a changing cystic pattern. Images were processed with CAD. IMPRESSION: No mammographic evidence of malignancy. A result letter of this screening mammogram will be mailed directly to the patient. RECOMMENDATION: Screening mammogram in one year. (Code:SM-B-01Y) BI-RADS CATEGORY  2: Benign. Electronically Signed   By: Sande Brothers M.D.   On: 02/19/2017 09:22    Assessment & Plan:   There are no diagnoses linked to this encounter.   No orders of the defined types were placed in this encounter.    Follow-up: No follow-ups on file.  Sonda Primes, MD

## 2017-11-17 NOTE — Assessment & Plan Note (Signed)
Xanax prn  Potential benefits of a long term benzodiazepines  use as well as potential risks  and complications were explained to the patient and were aknowledged. 

## 2018-01-29 ENCOUNTER — Other Ambulatory Visit: Payer: Self-pay

## 2018-02-13 ENCOUNTER — Other Ambulatory Visit: Payer: Self-pay | Admitting: Internal Medicine

## 2018-02-23 ENCOUNTER — Encounter: Payer: Self-pay | Admitting: Internal Medicine

## 2018-02-23 ENCOUNTER — Ambulatory Visit (INDEPENDENT_AMBULATORY_CARE_PROVIDER_SITE_OTHER): Payer: 59 | Admitting: Internal Medicine

## 2018-02-23 DIAGNOSIS — J301 Allergic rhinitis due to pollen: Secondary | ICD-10-CM | POA: Diagnosis not present

## 2018-02-23 DIAGNOSIS — G43001 Migraine without aura, not intractable, with status migrainosus: Secondary | ICD-10-CM

## 2018-02-23 DIAGNOSIS — F900 Attention-deficit hyperactivity disorder, predominantly inattentive type: Secondary | ICD-10-CM | POA: Diagnosis not present

## 2018-02-23 MED ORDER — AMPHETAMINE-DEXTROAMPHETAMINE 20 MG PO TABS: 20.0000 mg | ORAL_TABLET | Freq: Two times a day (BID) | ORAL | 0 refills | Status: DC

## 2018-02-23 NOTE — Assessment & Plan Note (Signed)
Imitrex prn 

## 2018-02-23 NOTE — Assessment & Plan Note (Signed)
Adderall Potential benefits of a long term amphetamines  use as well as potential risks  and complications were explained to the patient and were aknowledged. 

## 2018-02-23 NOTE — Patient Instructions (Signed)

## 2018-02-23 NOTE — Assessment & Plan Note (Addendum)
Nasonex - not covered this year. Needs a replacement

## 2018-02-23 NOTE — Progress Notes (Signed)
Subjective:  Patient ID: Heather Freeman, female    DOB: April 02, 1965  Age: 53 y.o. MRN: [id number]  CC: No chief complaint on file.   HPI Heather Freeman presents for a well exam  Outpatient Medications Prior to Visit  Medication Sig Dispense Refill  . amphetamine-dextroamphetamine (ADDERALL) 20 MG tablet Take 1 tablet (20 mg total) by mouth 2 (two) times daily. Please fill on or after 01/25/18 60 tablet 0  . Cholecalciferol (VITAMIN D3) 1000 UNITS tablet Take 1,000 Units by mouth daily.      Marland Kitchen. loratadine (CLARITIN) 10 MG tablet Take 10 mg by mouth daily as needed. For allergies.     . mometasone (NASONEX) 50 MCG/ACT nasal spray Place 2 sprays into the nose daily. 17 g 12  . montelukast (SINGULAIR) 10 MG tablet TAKE 1 TABLET BY MOUTH EVERY DAY 90 tablet 3  . PREMPRO 0.625-5 MG tablet TAKE 1 TABLET BY MOUTH EVERY DAY 28 tablet 5  . progesterone (PROMETRIUM) 200 MG capsule Take 1 capsule (200 mg total) by mouth daily. Take one tablet the first 12 days of the month 40 capsule 4  . SUMAtriptan (IMITREX) 50 MG tablet TAKE 1 TABLET AT ONSET OF HEADACHE. MAY REPEAT A SECOND DOSE IN 2 HOURS. NOT TO EXCEED 2 TABLETS IN 24 HOURS 12 tablet 5  . triamcinolone cream (KENALOG) 0.1 % Apply 1 application topically 2 (two) times daily. In a jar 450 g 3   No facility-administered medications prior to visit.     ROS: Review of Systems  Constitutional: Negative for activity change, appetite change, chills, fatigue and unexpected weight change.  HENT: Negative for congestion, mouth sores and sinus pressure.   Eyes: Negative for visual disturbance.  Respiratory: Negative for cough and chest tightness.   Gastrointestinal: Negative for abdominal pain and nausea.  Genitourinary: Negative for difficulty urinating, frequency and vaginal pain.  Musculoskeletal: Negative for back pain and gait problem.  Skin: Negative for pallor and rash.  Neurological: Negative for dizziness, tremors, weakness, numbness and  headaches.  Psychiatric/Behavioral: Positive for decreased concentration. Negative for confusion, sleep disturbance and suicidal ideas. The patient is nervous/anxious.     Objective:  BP 122/76 (BP Location: Left Arm, Patient Position: Sitting, Cuff Size: Normal)   Pulse 83   Temp 97.9 F (36.6 C) (Oral)   Ht 5' 4.5" (1.638 m)   Wt 145 lb (65.8 kg)   SpO2 98%   BMI 24.50 kg/m   BP Readings from Last 3 Encounters:  02/23/18 122/76  11/17/17 120/78  08/25/17 122/70    Wt Readings from Last 3 Encounters:  02/23/18 145 lb (65.8 kg)  11/17/17 141 lb (64 kg)  08/25/17 136 lb (61.7 kg)    Physical Exam Constitutional:      General: She is not in acute distress.    Appearance: She is well-developed.  HENT:     Head: Normocephalic.     Right Ear: External ear normal.     Left Ear: External ear normal.     Nose: Nose normal.  Eyes:     General:        Right eye: No discharge.        Left eye: No discharge.     Conjunctiva/sclera: Conjunctivae normal.     Pupils: Pupils are equal, round, and reactive to light.  Neck:     Musculoskeletal: Normal range of motion and neck supple.     Thyroid: No thyromegaly.     Vascular: No JVD.  Trachea: No tracheal deviation.  Cardiovascular:     Rate and Rhythm: Normal rate and regular rhythm.     Heart sounds: Normal heart sounds.  Pulmonary:     Effort: No respiratory distress.     Breath sounds: No stridor. No wheezing.  Abdominal:     General: Bowel sounds are normal. There is no distension.     Palpations: Abdomen is soft. There is no mass.     Tenderness: There is no abdominal tenderness. There is no guarding or rebound.  Musculoskeletal:        General: No tenderness.  Lymphadenopathy:     Cervical: No cervical adenopathy.  Skin:    Findings: No erythema or rash.  Neurological:     Cranial Nerves: No cranial nerve deficit.     Motor: No abnormal muscle tone.     Coordination: Coordination normal.     Deep Tendon  Reflexes: Reflexes normal.  Psychiatric:        Behavior: Behavior normal.        Thought Content: Thought content normal.        Judgment: Judgment normal.     Lab Results  Component Value Date   WBC 7.9 10/20/2016   HGB 13.5 10/20/2016   HCT 39.8 10/20/2016   PLT 389.0 10/20/2016   GLUCOSE 111 (H) 10/20/2016   CHOL 186 10/20/2016   TRIG 47.0 10/20/2016   HDL 73.30 10/20/2016   LDLDIRECT 101.3 11/09/2012   LDLCALC 103 (H) 10/20/2016   ALT 10 10/20/2016   AST 17 10/20/2016   NA 137 10/20/2016   K 3.9 10/20/2016   CL 104 10/20/2016   CREATININE 0.73 10/20/2016   BUN 10 10/20/2016   CO2 27 10/20/2016   TSH 1.59 10/20/2016   INR 1.1 (H) 10/20/2016    Mm Screening Breast Tomo Bilateral  Result Date: 02/19/2017 CLINICAL DATA:  Screening. EXAM: DIGITAL SCREENING BILATERAL MAMMOGRAM WITH TOMO AND CAD COMPARISON:  Previous exam(s). ACR Breast Density Category c: The breast tissue is heterogeneously dense, which may obscure small masses. FINDINGS: There are no findings suspicious for malignancy. Waxing and waning circumscribed equal density masses are consistent with a changing cystic pattern. Images were processed with CAD. IMPRESSION: No mammographic evidence of malignancy. A result letter of this screening mammogram will be mailed directly to the patient. RECOMMENDATION: Screening mammogram in one year. (Code:SM-B-01Y) BI-RADS CATEGORY  2: Benign. Electronically Signed   By: Sande BrothersSerena  Chacko M.D.   On: 02/19/2017 09:22    Assessment & Plan:   There are no diagnoses linked to this encounter.   No orders of the defined types were placed in this encounter.    Follow-up: No follow-ups on file.  Sonda PrimesAlex Plotnikov, MD

## 2018-03-12 ENCOUNTER — Other Ambulatory Visit (INDEPENDENT_AMBULATORY_CARE_PROVIDER_SITE_OTHER): Payer: 59

## 2018-03-12 DIAGNOSIS — Z0000 Encounter for general adult medical examination without abnormal findings: Secondary | ICD-10-CM | POA: Diagnosis not present

## 2018-03-12 LAB — HEPATIC FUNCTION PANEL
ALK PHOS: 53 U/L (ref 39–117)
ALT: 15 U/L (ref 0–35)
AST: 24 U/L (ref 0–37)
Albumin: 4.4 g/dL (ref 3.5–5.2)
BILIRUBIN TOTAL: 0.6 mg/dL (ref 0.2–1.2)
Bilirubin, Direct: 0.1 mg/dL (ref 0.0–0.3)
Total Protein: 7.3 g/dL (ref 6.0–8.3)

## 2018-03-12 LAB — CBC WITH DIFFERENTIAL/PLATELET
BASOS PCT: 0.6 % (ref 0.0–3.0)
Basophils Absolute: 0 10*3/uL (ref 0.0–0.1)
EOS ABS: 0.3 10*3/uL (ref 0.0–0.7)
Eosinophils Relative: 4.3 % (ref 0.0–5.0)
HCT: 42.7 % (ref 36.0–46.0)
HEMOGLOBIN: 14.8 g/dL (ref 12.0–15.0)
LYMPHS ABS: 2.7 10*3/uL (ref 0.7–4.0)
Lymphocytes Relative: 33.3 % (ref 12.0–46.0)
MCHC: 34.7 g/dL (ref 30.0–36.0)
MCV: 91 fl (ref 78.0–100.0)
MONO ABS: 0.4 10*3/uL (ref 0.1–1.0)
Monocytes Relative: 5.4 % (ref 3.0–12.0)
NEUTROS PCT: 56.4 % (ref 43.0–77.0)
Neutro Abs: 4.5 10*3/uL (ref 1.4–7.7)
Platelets: 442 10*3/uL — ABNORMAL HIGH (ref 150.0–400.0)
RBC: 4.69 Mil/uL (ref 3.87–5.11)
RDW: 12.4 % (ref 11.5–15.5)
WBC: 8 10*3/uL (ref 4.0–10.5)

## 2018-03-12 LAB — URINALYSIS, ROUTINE W REFLEX MICROSCOPIC
Bilirubin Urine: NEGATIVE
Ketones, ur: NEGATIVE
Nitrite: NEGATIVE
SPECIFIC GRAVITY, URINE: 1.015 (ref 1.000–1.030)
TOTAL PROTEIN, URINE-UPE24: NEGATIVE
URINE GLUCOSE: NEGATIVE
UROBILINOGEN UA: 0.2 (ref 0.0–1.0)
pH: 7 (ref 5.0–8.0)

## 2018-03-12 LAB — TSH: TSH: 1.46 u[IU]/mL (ref 0.35–4.50)

## 2018-03-12 LAB — LIPID PANEL
CHOLESTEROL: 200 mg/dL (ref 0–200)
HDL: 66.7 mg/dL (ref >39.00)
LDL CALC: 123 mg/dL — AB (ref 0–99)
NONHDL: 133.15
Total CHOL/HDL Ratio: 3
Triglycerides: 52 mg/dL (ref 0.0–149.0)
VLDL: 10.4 mg/dL (ref 0.0–40.0)

## 2018-03-12 LAB — BASIC METABOLIC PANEL
BUN: 13 mg/dL (ref 6–23)
CALCIUM: 9.1 mg/dL (ref 8.4–10.5)
CO2: 28 meq/L (ref 19–32)
CREATININE: 0.78 mg/dL (ref 0.40–1.20)
Chloride: 103 mEq/L (ref 96–112)
GFR: 76.77 mL/min (ref >60.00)
Glucose, Bld: 97 mg/dL (ref 70–99)
Potassium: 4.3 mEq/L (ref 3.5–5.1)
SODIUM: 140 meq/L (ref 135–145)

## 2018-03-14 ENCOUNTER — Other Ambulatory Visit: Payer: Self-pay | Admitting: Internal Medicine

## 2018-03-14 MED ORDER — ASPIRIN EC 81 MG PO TBEC: 81.0000 mg | DELAYED_RELEASE_TABLET | Freq: Every day | ORAL | 3 refills | Status: AC

## 2018-03-15 ENCOUNTER — Other Ambulatory Visit: Payer: Self-pay | Admitting: Internal Medicine

## 2018-03-15 DIAGNOSIS — Z1231 Encounter for screening mammogram for malignant neoplasm of breast: Secondary | ICD-10-CM

## 2018-03-17 ENCOUNTER — Ambulatory Visit
Admission: RE | Admit: 2018-03-17 | Discharge: 2018-03-17 | Disposition: A | Discharge status: Home / Self Care | Payer: 59 | Source: Ambulatory Visit

## 2018-03-17 ENCOUNTER — Encounter: Payer: Self-pay | Admitting: Radiology

## 2018-03-17 DIAGNOSIS — Z1231 Encounter for screening mammogram for malignant neoplasm of breast: Secondary | ICD-10-CM | POA: Diagnosis not present

## 2018-05-25 ENCOUNTER — Other Ambulatory Visit: Payer: Self-pay

## 2018-05-25 ENCOUNTER — Other Ambulatory Visit: Payer: Self-pay | Admitting: Internal Medicine

## 2018-05-25 MED ORDER — AMPHETAMINE-DEXTROAMPHETAMINE 20 MG PO TABS: 20.0000 mg | ORAL_TABLET | Freq: Two times a day (BID) | ORAL | 0 refills | Status: DC

## 2018-05-25 NOTE — Telephone Encounter (Signed)
1 month supply sent to pharmacy.  Have her follow-up with Dr. Posey Rea however she typically does.

## 2018-06-24 ENCOUNTER — Encounter: Payer: Self-pay | Admitting: Internal Medicine

## 2018-06-24 ENCOUNTER — Ambulatory Visit (INDEPENDENT_AMBULATORY_CARE_PROVIDER_SITE_OTHER): Payer: 59 | Admitting: Internal Medicine

## 2018-06-24 ENCOUNTER — Telehealth: Payer: Self-pay | Admitting: Internal Medicine

## 2018-06-24 DIAGNOSIS — N951 Menopausal and female climacteric states: Secondary | ICD-10-CM | POA: Diagnosis not present

## 2018-06-24 DIAGNOSIS — N3000 Acute cystitis without hematuria: Secondary | ICD-10-CM

## 2018-06-24 DIAGNOSIS — F900 Attention-deficit hyperactivity disorder, predominantly inattentive type: Secondary | ICD-10-CM | POA: Diagnosis not present

## 2018-06-24 MED ORDER — AMPHETAMINE-DEXTROAMPHETAMINE 20 MG PO TABS: 20.0000 mg | ORAL_TABLET | Freq: Two times a day (BID) | ORAL | 0 refills | Status: DC

## 2018-06-24 MED ORDER — CEFUROXIME AXETIL 250 MG PO TABS: 250.0000 mg | ORAL_TABLET | Freq: Two times a day (BID) | ORAL | 0 refills | Status: AC

## 2018-06-24 MED ORDER — PREMPRO 0.625-5 MG PO TABS: 1.0000 | ORAL_TABLET | Freq: Every day | ORAL | 5 refills | Status: DC

## 2018-06-24 NOTE — Assessment & Plan Note (Signed)
On Adderall  Potential benefits of a long term amphetamines  use as well as potential risks  and complications were explained to the patient and were aknowledged. 

## 2018-06-24 NOTE — Assessment & Plan Note (Signed)
Ceftin po 

## 2018-06-24 NOTE — Progress Notes (Signed)
Virtual Visit via Video Note  I connected with Heather Freeman on 06/24/18 at  1:20 PM EDT by a video enabled telemedicine application and verified that I am speaking with the correct person using two identifiers.   I discussed the limitations of evaluation and management by telemedicine and the availability of in person appointments. The patient expressed understanding and agreed to proceed.  History of Present Illness: We need to follow-up on ADD, menopause C/o UTI sx's x 1 week  There has been no runny nose, cough, chest pain, shortness of breath, abdominal pain, diarrhea, constipation, arthralgias, skin rashes.   Observations/Objective: The patient appears to be in no acute distress, looks well.  Assessment and Plan:  See my Assessment and Plan. Follow Up Instructions:    I discussed the assessment and treatment plan with the patient. The patient was provided an opportunity to ask questions and all were answered. The patient agreed with the plan and demonstrated an understanding of the instructions.   The patient was advised to call back or seek an in-person evaluation if the symptoms worsen or if the condition fails to improve as anticipated.  I provided face-to-face time during this encounter. We were at different locations.   Walker Kehr, MD

## 2018-06-24 NOTE — Assessment & Plan Note (Signed)
Menopausal sweats  Prempro Pt tried multiple meds: issues w/side effects and cost  Potential benefits of a long term HRT use as well as potential risks  and complications were explained to the patient and were aknowledged.

## 2018-06-24 NOTE — Telephone Encounter (Signed)
Pharmacy notified per Plotnikov it is only for 5 days

## 2018-06-24 NOTE — Telephone Encounter (Signed)
Copied from [place] [id number]. Topic: Quick Communication - Rx Refill/Question >> Jun 24, 2018  2:01 PM Heather Freeman B, [place] wrote: Heather Freeman with [facility] calling and states that the prescription states to take 1 tablet  2x a day for 7 days, but the quantity is for 10 and does not match the instructions. Would like clarification on how many provider would like prescribed.  CB#: [phone] **  Medication: cefUROXime (CEFTIN) 250 MG tablet   Has the patient contacted their pharmacy? N/A (Agent: If no, request that the patient contact the pharmacy for the refill.) (Agent: If yes, when and what did the pharmacy advise?)  Preferred Pharmacy (with phone number or street name): [id number] [place], [place]  Agent: Please be advised that RX refills may take up to 3 business days. We ask that you follow-up with your pharmacy.

## 2018-09-22 ENCOUNTER — Other Ambulatory Visit: Payer: Self-pay

## 2018-09-23 ENCOUNTER — Other Ambulatory Visit: Payer: Self-pay

## 2018-09-23 NOTE — Telephone Encounter (Signed)
Requested medication (s) are due for refill today: yes  Requested medication (s) are on the active medication list: yes  Last refill:  06/24/2018  Future visit scheduled: yes  Notes to clinic:  This refill cannot be delegated   Requested Prescriptions  Pending Prescriptions Disp Refills   amphetamine-dextroamphetamine (ADDERALL) 20 MG tablet 60 tablet 0    Sig: Take 1 tablet (20 mg total) by mouth 2 (two) times daily. AM and lunch.     Not Delegated - Psychiatry:  Stimulants/ADHD Failed - 09/23/2018  3:26 PM      Failed - This refill cannot be delegated      Failed - Urine Drug Screen completed in last 360 days.      Failed - Valid encounter within last 3 months    Recent Outpatient Visits          3 months ago Attention deficit hyperactivity disorder (ADHD), predominantly inattentive type   [place] Plotnikov, Evie Lacks, MD   7 months ago Allergic rhinitis due to pollen, unspecified seasonality   [place] Plotnikov, Evie Lacks, MD   10 months ago Generalized anxiety disorder   [facility] Plotnikov, Evie Lacks, MD   1 year ago Attention deficit hyperactivity disorder (ADHD), predominantly inattentive type   [place], Evie Lacks, MD   1 year ago Attention deficit hyperactivity disorder (ADHD), predominantly inattentive type   [place], MD      Future Appointments            In 2 weeks Plotnikov, Evie Lacks, MD [place], [place]

## 2018-09-23 NOTE — Telephone Encounter (Signed)
Medication Refill - Medication: amphetamine-dextroamphetamine (ADDERALL) 20 MG tablet (Patient is going out of town and the pharmacy still hasnt received authorization for medication refill. Patient would like a callback once medication has been sent.)   Has the patient contacted their pharmacy? Yes (Agent: If no, request that the patient contact the pharmacy for the refill.) (Agent: If yes, when and what did the pharmacy advise?)Contact PCP  Preferred Pharmacy (with phone number or street name):  [id number] [place], [place] [phone] (Phone) [phone] (Fax)     Agent: Please be advised that RX refills may take up to 3 business days. We ask that you follow-up with your pharmacy.

## 2018-09-23 NOTE — Telephone Encounter (Signed)
RX request sent to PCP

## 2018-09-24 ENCOUNTER — Telehealth: Payer: Self-pay | Admitting: Internal Medicine

## 2018-09-24 NOTE — Telephone Encounter (Signed)
Pt calling to check status. Pt would like a call back from the nurse regarding. Pt states that she is going out of town today and will need this medication. Could this be called into another pharmacy in [place]. Please call cell phone.  Cb# [phone]

## 2018-09-24 NOTE — Telephone Encounter (Signed)
Medication: amphetamine-dextroamphetamine (ADDERALL) 20 MG tablet [[id number]]   Has the patient contacted their pharmacy? Yes  (Agent: If no, request that the patient contact the pharmacy for the refill.) (Agent: If yes, when and what did the pharmacy advise?)  Preferred Pharmacy (with phone number or street name):  [facility] [address] [place], [place] [address] [phone]  Agent: Please be advised that RX refills may take up to 3 business days. We ask that you follow-up with your pharmacy.

## 2018-09-27 MED ORDER — AMPHETAMINE-DEXTROAMPHETAMINE 20 MG PO TABS: 20.0000 mg | ORAL_TABLET | Freq: Two times a day (BID) | ORAL | 0 refills | Status: DC

## 2018-09-27 NOTE — Telephone Encounter (Signed)
RX sent

## 2018-10-12 ENCOUNTER — Encounter: Payer: Self-pay | Admitting: Internal Medicine

## 2018-10-12 ENCOUNTER — Ambulatory Visit (INDEPENDENT_AMBULATORY_CARE_PROVIDER_SITE_OTHER): Payer: 59 | Admitting: Internal Medicine

## 2018-10-12 DIAGNOSIS — F900 Attention-deficit hyperactivity disorder, predominantly inattentive type: Secondary | ICD-10-CM

## 2018-10-12 MED ORDER — AMPHETAMINE-DEXTROAMPHETAMINE 20 MG PO TABS: 20.0000 mg | ORAL_TABLET | Freq: Two times a day (BID) | ORAL | 0 refills | Status: DC

## 2018-10-12 NOTE — Progress Notes (Signed)
Virtual Visit via Video Note  I connected with Heather Freeman on 10/12/18 at  2:20 PM EDT by a video enabled telemedicine application and verified that I am speaking with the correct person using two identifiers.   I discussed the limitations of evaluation and management by telemedicine and the availability of in person appointments. The patient expressed understanding and agreed to proceed.  History of Present Illness: We need to follow-up on ADD  There has been no runny nose, cough, chest pain, shortness of breath, abdominal pain, diarrhea, constipation, arthralgias, skin rashes.   Observations/Objective: The patient appears to be in no acute distress, looks well.  Assessment and Plan:  See my Assessment and Plan. Follow Up Instructions:    I discussed the assessment and treatment plan with the patient. The patient was provided an opportunity to ask questions and all were answered. The patient agreed with the plan and demonstrated an understanding of the instructions.   The patient was advised to call back or seek an in-person evaluation if the symptoms worsen or if the condition fails to improve as anticipated.  I provided face-to-face time during this encounter. We were at different locations.   Walker Kehr, MD

## 2018-10-12 NOTE — Assessment & Plan Note (Signed)
Cont Adderall  Potential benefits of a long term amphetamines  use as well as potential risks  and complications were explained to the patient and were aknowledged.  

## 2019-01-25 ENCOUNTER — Encounter: Payer: Self-pay | Admitting: Internal Medicine

## 2019-01-25 ENCOUNTER — Other Ambulatory Visit: Payer: Self-pay

## 2019-01-25 ENCOUNTER — Ambulatory Visit (INDEPENDENT_AMBULATORY_CARE_PROVIDER_SITE_OTHER): Payer: 59 | Admitting: Internal Medicine

## 2019-01-25 DIAGNOSIS — F900 Attention-deficit hyperactivity disorder, predominantly inattentive type: Secondary | ICD-10-CM | POA: Diagnosis not present

## 2019-01-25 DIAGNOSIS — G43001 Migraine without aura, not intractable, with status migrainosus: Secondary | ICD-10-CM

## 2019-01-25 MED ORDER — AMPHETAMINE-DEXTROAMPHETAMINE 20 MG PO TABS: 20.0000 mg | ORAL_TABLET | Freq: Two times a day (BID) | ORAL | 0 refills | Status: DC

## 2019-01-25 MED ORDER — AMPHETAMINE-DEXTROAMPHETAMINE 20 MG PO TABS
20.0000 mg | ORAL_TABLET | Freq: Two times a day (BID) | ORAL | 0 refills | Status: DC
Start: 2019-01-25 — End: 2019-05-03

## 2019-01-25 NOTE — Progress Notes (Signed)
Subjective:  Patient ID: Heather Freeman, female    DOB: 1965/11/22  Age: 54 y.o. MRN: [id number]  CC: No chief complaint on file.   HPI AYANNA GHEEN presents for ADD, HAs  Outpatient Medications Prior to Visit  Medication Sig Dispense Refill  . amphetamine-dextroamphetamine (ADDERALL) 20 MG tablet Take 1 tablet (20 mg total) by mouth 2 (two) times daily. AM and lunch 60 tablet 0  . amphetamine-dextroamphetamine (ADDERALL) 20 MG tablet Take 1 tablet (20 mg total) by mouth 2 (two) times daily. AM and lunch 60 tablet 0  . amphetamine-dextroamphetamine (ADDERALL) 20 MG tablet Take 1 tablet (20 mg total) by mouth 2 (two) times daily. AM and lunch. 60 tablet 0  . aspirin EC 81 MG tablet Take 1 tablet (81 mg total) by mouth daily. 100 tablet 3  . Cholecalciferol (VITAMIN D3) 1000 UNITS tablet Take 1,000 Units by mouth daily.      Marland Kitchen estrogen, conjugated,-medroxyprogesterone (PREMPRO) 0.625-5 MG tablet Take 1 tablet by mouth daily. 28 tablet 5  . loratadine (CLARITIN) 10 MG tablet Take 10 mg by mouth daily as needed. For allergies.     . montelukast (SINGULAIR) 10 MG tablet TAKE 1 TABLET BY MOUTH EVERY DAY 90 tablet 3  . progesterone (PROMETRIUM) 200 MG capsule Take 1 capsule (200 mg total) by mouth daily. Take one tablet the first 12 days of the month 40 capsule 4  . SUMAtriptan (IMITREX) 50 MG tablet TAKE 1 TABLET AT ONSET OF HEADACHE. MAY REPEAT A SECOND DOSE IN 2 HOURS. NOT TO EXCEED 2 TABLETS IN 24 HOURS 12 tablet 5  . triamcinolone cream (KENALOG) 0.1 % Apply 1 application topically 2 (two) times daily. In a jar 450 g 3  . mometasone (NASONEX) 50 MCG/ACT nasal spray Place 2 sprays into the nose daily. 17 g 12   No facility-administered medications prior to visit.    ROS: Review of Systems  Constitutional: Negative for activity change, appetite change, chills, fatigue and unexpected weight change.  HENT: Negative for congestion, mouth sores and sinus pressure.   Eyes: Negative for  visual disturbance.  Respiratory: Negative for cough and chest tightness.   Gastrointestinal: Negative for abdominal pain and nausea.  Genitourinary: Negative for difficulty urinating, frequency and vaginal pain.  Musculoskeletal: Negative for back pain and gait problem.  Skin: Negative for pallor and rash.  Neurological: Negative for dizziness, tremors, weakness, numbness and headaches.  Psychiatric/Behavioral: Positive for decreased concentration. Negative for confusion, sleep disturbance and suicidal ideas.    Objective:  BP 124/72 (BP Location: Left Arm, Patient Position: Sitting, Cuff Size: Normal)   Pulse 89   Temp 98.4 F (36.9 C) (Oral)   Ht 5' 4.5" (1.638 m)   Wt 147 lb (66.7 kg)   LMP 06/13/2012   SpO2 97%   BMI 24.84 kg/m   BP Readings from Last 3 Encounters:  01/25/19 124/72  02/23/18 122/76  11/17/17 120/78    Wt Readings from Last 3 Encounters:  01/25/19 147 lb (66.7 kg)  02/23/18 145 lb (65.8 kg)  11/17/17 141 lb (64 kg)    Physical Exam Constitutional:      General: She is not in acute distress.    Appearance: She is well-developed.  HENT:     Head: Normocephalic.     Right Ear: External ear normal.     Left Ear: External ear normal.     Nose: Nose normal.  Eyes:     General:  Right eye: No discharge.        Left eye: No discharge.     Conjunctiva/sclera: Conjunctivae normal.     Pupils: Pupils are equal, round, and reactive to light.  Neck:     Thyroid: No thyromegaly.     Vascular: No JVD.     Trachea: No tracheal deviation.  Cardiovascular:     Rate and Rhythm: Normal rate and regular rhythm.     Heart sounds: Normal heart sounds.  Pulmonary:     Effort: No respiratory distress.     Breath sounds: No stridor. No wheezing.  Abdominal:     General: Bowel sounds are normal. There is no distension.     Palpations: Abdomen is soft. There is no mass.     Tenderness: There is no abdominal tenderness. There is no guarding or rebound.    Musculoskeletal:        General: No tenderness.     Cervical back: Normal range of motion and neck supple.  Lymphadenopathy:     Cervical: No cervical adenopathy.  Skin:    Findings: No erythema or rash.  Neurological:     Cranial Nerves: No cranial nerve deficit.     Motor: No abnormal muscle tone.     Coordination: Coordination normal.     Deep Tendon Reflexes: Reflexes normal.  Psychiatric:        Behavior: Behavior normal.        Thought Content: Thought content normal.        Judgment: Judgment normal.     Lab Results  Component Value Date   WBC 8.0 03/12/2018   HGB 14.8 03/12/2018   HCT 42.7 03/12/2018   PLT 442.0 (H) 03/12/2018   GLUCOSE 97 03/12/2018   CHOL 200 03/12/2018   TRIG 52.0 03/12/2018   HDL 66.70 03/12/2018   LDLDIRECT 101.3 11/09/2012   LDLCALC 123 (H) 03/12/2018   ALT 15 03/12/2018   AST 24 03/12/2018   NA 140 03/12/2018   K 4.3 03/12/2018   CL 103 03/12/2018   CREATININE 0.78 03/12/2018   BUN 13 03/12/2018   CO2 28 03/12/2018   TSH 1.46 03/12/2018   INR 1.1 (H) 10/20/2016    MM 3D SCREEN BREAST BILATERAL  Result Date: 03/17/2018 CLINICAL DATA:  Screening. EXAM: DIGITAL SCREENING BILATERAL MAMMOGRAM WITH TOMO AND CAD COMPARISON:  Previous exam(s). ACR Breast Density Category c: The breast tissue is heterogeneously dense, which may obscure small masses. FINDINGS: There are no findings suspicious for malignancy. Images were processed with CAD. IMPRESSION: No mammographic evidence of malignancy. A result letter of this screening mammogram will be mailed directly to the patient. RECOMMENDATION: Screening mammogram in one year. (Code:SM-B-01Y) BI-RADS CATEGORY  1: Negative. Electronically Signed   By: Lajean Manes M.D.   On: 03/17/2018 15:56    Assessment & Plan:  \  Follow-up: No follow-ups on file.  Walker Kehr, MD

## 2019-01-25 NOTE — Assessment & Plan Note (Signed)
Stable.  Imitrex as needed

## 2019-01-25 NOTE — Assessment & Plan Note (Signed)
Adderall

## 2019-02-16 ENCOUNTER — Other Ambulatory Visit: Payer: Self-pay | Admitting: Internal Medicine

## 2019-04-26 ENCOUNTER — Other Ambulatory Visit: Payer: Self-pay

## 2019-04-26 ENCOUNTER — Ambulatory Visit (INDEPENDENT_AMBULATORY_CARE_PROVIDER_SITE_OTHER): Payer: 59 | Admitting: Nurse Practitioner

## 2019-04-26 ENCOUNTER — Encounter: Payer: Self-pay | Admitting: Nurse Practitioner

## 2019-04-26 VITALS — BP 118/78 | Wt 149.0 lb

## 2019-04-26 DIAGNOSIS — Z01419 Encounter for gynecological examination (general) (routine) without abnormal findings: Secondary | ICD-10-CM

## 2019-04-26 DIAGNOSIS — R350 Frequency of micturition: Secondary | ICD-10-CM | POA: Diagnosis not present

## 2019-04-26 DIAGNOSIS — N951 Menopausal and female climacteric states: Secondary | ICD-10-CM | POA: Diagnosis not present

## 2019-04-26 NOTE — Progress Notes (Signed)
Heather Freeman 07/26/1965 [id number]    History: 54 year old MWF G2P2 presents for annual. Postmenopausal, on Prempro for symptom management. Tried to stop OCP but began having hot flashes and migraines. Colposcopy directed biopsy in 2011 and LGSIL with normal subsequent paps. Also had an endometrial ablasion in 2011. Complains of urinary frequency that comes and goes, denies burning with urination or hematuria. Denies vaginal itching, discharge, or odor. Has appointment with PCP next week. Exercises and does SBEs regularly.   Mammogram 03/2018 normal Pap with HR HPV 2017 normal Dexa 2006 normal, done once for knee surgery  Cologuard 2019 negative  Past medical history, past surgical history, family history and social history were all reviewed and documented in the EPIC chart. Lost son in car accident in 2016, sister passed in 2019.   ROS:  A ROS was performed and pertinent positives and negatives are included.  Exam:  Vitals:   04/26/19 1423  Weight: 149 lb (67.6 kg)   Body mass index is 25.18 kg/m.   General appearance:  Normal Thyroid:  Symmetrical, normal in size, without palpable masses or nodularity. Respiratory  Auscultation:  Clear without wheezing or rhonchi Cardiovascular  Auscultation:  Regular rate, without rubs, murmurs or gallops  Edema/varicosities:  Not grossly evident Abdominal  Soft,nontender, without masses, guarding or rebound.  Liver/spleen:  No organomegaly noted  Hernia:  None appreciated  Skin  Inspection:  Grossly normal   Breasts: Examined lying and sitting.     Right: Without masses, retractions, discharge or axillary adenopathy.     Left: Without masses, retractions, discharge or axillary adenopathy. Gentitourinary   Inguinal/mons:  Normal without inguinal adenopathy  External genitalia:  Normal  BUS/Urethra/Skene's glands:  Normal  Vagina:  Normal, no erythema  Cervix:  Normal, no discharge  Uterus:  Anteverted, normal in size, shape and  contour.  Midline and mobile  Adnexa/parametria:     Rt: Without masses or tenderness.   Lt: Without masses or tenderness.  Anus and perineum: Normal  Assessment:  54 y.o. MWF G2P2 for annual   Well female exam with gynecological exam Urinary frequency Vasomotor menopausal symptoms PCP: labs  Plan: UA pending. Education provided on SBEs, regular exercise, and multivitamin daily. Will continue OCP for symptom management for now. Plans to schedule mammogram soon as long as insurance covers annually. Follow up in 1 year for annual.    [facility], 2:24 PM 04/26/2019

## 2019-04-26 NOTE — Patient Instructions (Signed)
Nice to meet you today! Follow up in 1 year for annual Schedule mammogram Multivitamin Daily  Health Maintenance, Female Adopting a healthy lifestyle and getting preventive care are important in promoting health and wellness. Ask your health care provider about:  The right schedule for you to have regular tests and exams.  Things you can do on your own to prevent diseases and keep yourself healthy. What should I know about diet, weight, and exercise? Eat a healthy diet   Eat a diet that includes plenty of vegetables, fruits, low-fat dairy products, and lean protein.  Do not eat a lot of foods that are high in solid fats, added sugars, or sodium. Maintain a healthy weight Body mass index (BMI) is used to identify weight problems. It estimates body fat based on height and weight. Your health care provider can help determine your BMI and help you achieve or maintain a healthy weight. Get regular exercise Get regular exercise. This is one of the most important things you can do for your health. Most adults should:  Exercise for at least 150 minutes each week. The exercise should increase your heart rate and make you sweat (moderate-intensity exercise).  Do strengthening exercises at least twice a week. This is in addition to the moderate-intensity exercise.  Spend less time sitting. Even light physical activity can be beneficial. Watch cholesterol and blood lipids Have your blood tested for lipids and cholesterol at 56 years of age, then have this test every 5 years. Have your cholesterol levels checked more often if:  Your lipid or cholesterol levels are high.  You are older than 56 years of age.  You are at high risk for heart disease. What should I know about cancer screening? Depending on your health history and family history, you may need to have cancer screening at various ages. This may include screening for:  Breast cancer.  Cervical cancer.  Colorectal  cancer.  Skin cancer.  Lung cancer. What should I know about heart disease, diabetes, and high blood pressure? Blood pressure and heart disease  High blood pressure causes heart disease and increases the risk of stroke. This is more likely to develop in people who have high blood pressure readings, are of African descent, or are overweight.  Have your blood pressure checked: ? Every 3-5 years if you are 53-30 years of age. ? Every year if you are 74 years old or older. Diabetes Have regular diabetes screenings. This checks your fasting blood sugar level. Have the screening done:  Once every three years after age 32 if you are at a normal weight and have a low risk for diabetes.  More often and at a younger age if you are overweight or have a high risk for diabetes. What should I know about preventing infection? Hepatitis B If you have a higher risk for hepatitis B, you should be screened for this virus. Talk with your health care provider to find out if you are at risk for hepatitis B infection. Hepatitis C Testing is recommended for:  Everyone born from [birth year] through 1967.  Anyone with known risk factors for hepatitis C. Sexually transmitted infections (STIs)  Get screened for STIs, including gonorrhea and chlamydia, if: ? You are sexually active and are younger than 56 years of age. ? You are older than 56 years of age and your health care provider tells you that you are at risk for this type of infection. ? Your sexual activity has changed since you were last  were last screened, and you are at increased risk for chlamydia or gonorrhea. Ask your health care provider if you are at risk. °· Ask your health care provider about whether you are at high risk for HIV. Your health care provider may recommend a prescription medicine to help prevent HIV infection. If you choose to take medicine to prevent HIV, you should first get tested for HIV. You should then be tested every 3 months for as long as  you are taking the medicine. °Pregnancy °· If you are about to stop having your period (premenopausal) and you may become pregnant, seek counseling before you get pregnant. °· Take 400 to 800 micrograms (mcg) of folic acid every day if you become pregnant. °· Ask for birth control (contraception) if you want to prevent pregnancy. °Osteoporosis and menopause °Osteoporosis is a disease in which the bones lose minerals and strength with aging. This can result in bone fractures. If you are 65 years old or older, or if you are at risk for osteoporosis and fractures, ask your health care provider if you should: °· Be screened for bone loss. °· Take a calcium or vitamin D supplement to lower your risk of fractures. °· Be given hormone replacement therapy (HRT) to treat symptoms of menopause. °Follow these instructions at home: °Lifestyle °· Do not use any products that contain nicotine or tobacco, such as cigarettes, e-cigarettes, and chewing tobacco. If you need help quitting, ask your health care provider. °· Do not use street drugs. °· Do not share needles. °· Ask your health care provider for help if you need support or information about quitting drugs. °Alcohol use °· Do not drink alcohol if: °? Your health care provider tells you not to drink. °? You are pregnant, may be pregnant, or are planning to become pregnant. °· If you drink alcohol: °? Limit how much you use to 0-1 drink a day. °? Limit intake if you are breastfeeding. °· Be aware of how much alcohol is in your drink. In the [place] one drink equals one 12 oz bottle of beer (355 mL), one 5 oz glass of wine (148 mL), or one 1½ oz glass of hard liquor (44 mL). °General instructions °· Schedule regular health, dental, and eye exams. °· Stay current with your vaccines. °· Tell your health care provider if: °? You often feel depressed. °? You have ever been abused or do not feel safe at home. °Summary °· Adopting a healthy lifestyle and getting preventive care are  important in promoting health and wellness. °· Follow your health care provider's instructions about healthy diet, exercising, and getting tested or screened for diseases. °· Follow your health care provider's instructions on monitoring your cholesterol and blood pressure. °This information is not intended to replace advice given to you by your health care provider. Make sure you discuss any questions you have with your health care provider. °Document Revised: 12/23/2017 Document Reviewed: 12/23/2017 °Elsevier Patient Education © 2020 [employer] Inc. ° °

## 2019-04-28 LAB — CULTURE INDICATED

## 2019-04-28 LAB — URINALYSIS, COMPLETE W/RFL CULTURE
Bacteria, UA: NONE SEEN /HPF
Bilirubin Urine: NEGATIVE
Glucose, UA: NEGATIVE
Hgb urine dipstick: NEGATIVE
Hyaline Cast: NONE SEEN /LPF
Leukocyte Esterase: NEGATIVE
Nitrites, Initial: NEGATIVE
Protein, ur: NEGATIVE
Specific Gravity, Urine: 1.023 (ref 1.001–1.03)
WBC, UA: NONE SEEN /HPF (ref 0–5)
pH: 5.5 (ref 5.0–8.0)

## 2019-04-28 LAB — URINE CULTURE
MICRO NUMBER:: 10359867
Result:: NO GROWTH
SPECIMEN QUALITY:: ADEQUATE

## 2019-05-03 ENCOUNTER — Other Ambulatory Visit: Payer: Self-pay

## 2019-05-03 ENCOUNTER — Encounter: Payer: Self-pay | Admitting: Internal Medicine

## 2019-05-03 ENCOUNTER — Ambulatory Visit: Payer: 59 | Admitting: Internal Medicine

## 2019-05-03 VITALS — BP 144/84 | HR 83 | Temp 98.3°F | Ht 64.5 in | Wt 149.0 lb

## 2019-05-03 DIAGNOSIS — M26621 Arthralgia of right temporomandibular joint: Secondary | ICD-10-CM | POA: Diagnosis not present

## 2019-05-03 DIAGNOSIS — Z0000 Encounter for general adult medical examination without abnormal findings: Secondary | ICD-10-CM

## 2019-05-03 DIAGNOSIS — F900 Attention-deficit hyperactivity disorder, predominantly inattentive type: Secondary | ICD-10-CM | POA: Diagnosis not present

## 2019-05-03 DIAGNOSIS — M26629 Arthralgia of temporomandibular joint, unspecified side: Secondary | ICD-10-CM | POA: Insufficient documentation

## 2019-05-03 MED ORDER — AMPHETAMINE-DEXTROAMPHETAMINE 20 MG PO TABS: 20.0000 mg | ORAL_TABLET | Freq: Two times a day (BID) | ORAL | 0 refills | Status: DC

## 2019-05-03 MED ORDER — CYCLOBENZAPRINE HCL 5 MG PO TABS: 5.0000 mg | ORAL_TABLET | Freq: Three times a day (TID) | ORAL | 1 refills | Status: DC | PRN

## 2019-05-03 NOTE — Assessment & Plan Note (Signed)
Recurrent - h/o TMJ surgery Ref to [facility] Flexeril

## 2019-05-03 NOTE — Assessment & Plan Note (Signed)
On Adderall  Potential benefits of a long term amphetamines  use as well as potential risks  and complications were explained to the patient and were aknowledged. 

## 2019-05-03 NOTE — Progress Notes (Signed)
Subjective:  Patient ID: Heather Freeman, female    DOB: [birth year]  Age: 54 y.o. MRN: [id number]  CC: No chief complaint on file.   HPI Heather Freeman presents for a R TMJ locking - very painful x 6 mo, stiff. H//o  F/u ADD  Outpatient Medications Prior to Visit  Medication Sig Dispense Refill  . amphetamine-dextroamphetamine (ADDERALL) 20 MG tablet Take 1 tablet (20 mg total) by mouth 2 (two) times daily. AM and lunch 60 tablet 0  . Cholecalciferol (VITAMIN D3) 1000 UNITS tablet Take 1,000 Units by mouth daily.      Marland Kitchen loratadine (CLARITIN) 10 MG tablet Take 10 mg by mouth daily as needed. For allergies.     . montelukast (SINGULAIR) 10 MG tablet TAKE 1 TABLET BY MOUTH EVERY DAY 90 tablet 3  . PREMPRO 0.625-5 MG tablet TAKE 1 TABLET BY MOUTH EVERY DAY 28 tablet 5   No facility-administered medications prior to visit.    ROS: Review of Systems  Constitutional: Negative for activity change, appetite change, chills, fatigue and unexpected weight change.  HENT: Negative for congestion, mouth sores and sinus pressure.   Eyes: Negative for visual disturbance.  Respiratory: Negative for cough and chest tightness.   Gastrointestinal: Negative for abdominal pain and nausea.  Genitourinary: Negative for difficulty urinating, frequency and vaginal pain.  Musculoskeletal: Negative for back pain and gait problem.  Skin: Negative for pallor and rash.  Neurological: Negative for dizziness, tremors, weakness, numbness and headaches.  Psychiatric/Behavioral: Positive for decreased concentration. Negative for confusion and sleep disturbance.    Objective:  BP (!) 144/84 (BP Location: Left Arm, Patient Position: Sitting, Cuff Size: Normal)   Pulse 83   Temp 98.3 F (36.8 C) (Oral)   Ht 5' 4.5" (1.638 m)   Wt 149 lb (67.6 kg)   LMP 06/13/2012   SpO2 95%   BMI 25.18 kg/m   BP Readings from Last 3 Encounters:  05/03/19 (!) 144/84  04/26/19 118/78  01/25/19 124/72    Wt Readings  from Last 3 Encounters:  05/03/19 149 lb (67.6 kg)  04/26/19 149 lb (67.6 kg)  01/25/19 147 lb (66.7 kg)    Physical Exam Constitutional:      General: She is not in acute distress.    Appearance: She is well-developed.  HENT:     Head: Normocephalic.     Right Ear: External ear normal.     Left Ear: External ear normal.     Nose: Nose normal.  Eyes:     General:        Right eye: No discharge.        Left eye: No discharge.     Conjunctiva/sclera: Conjunctivae normal.     Pupils: Pupils are equal, round, and reactive to light.  Neck:     Thyroid: No thyromegaly.     Vascular: No JVD.     Trachea: No tracheal deviation.  Cardiovascular:     Rate and Rhythm: Normal rate and regular rhythm.     Heart sounds: Normal heart sounds.  Pulmonary:     Effort: No respiratory distress.     Breath sounds: No stridor. No wheezing.  Abdominal:     General: Bowel sounds are normal. There is no distension.     Palpations: Abdomen is soft. There is no mass.     Tenderness: There is no abdominal tenderness. There is no guarding or rebound.  Musculoskeletal:        General: No tenderness.  Cervical back: Normal range of motion and neck supple.  Lymphadenopathy:     Cervical: No cervical adenopathy.  Skin:    Findings: No erythema or rash.  Neurological:     Mental Status: She is oriented to person, place, and time.     Cranial Nerves: No cranial nerve deficit.     Motor: No abnormal muscle tone.     Coordination: Coordination normal.     Deep Tendon Reflexes: Reflexes normal.  Psychiatric:        Behavior: Behavior normal.        Thought Content: Thought content normal.        Judgment: Judgment normal.     Lab Results  Component Value Date   WBC 8.0 03/12/2018   HGB 14.8 03/12/2018   HCT 42.7 03/12/2018   PLT 442.0 (H) 03/12/2018   GLUCOSE 97 03/12/2018   CHOL 200 03/12/2018   TRIG 52.0 03/12/2018   HDL 66.70 03/12/2018   LDLDIRECT 101.3 11/09/2012   LDLCALC 123 (H)  03/12/2018   ALT 15 03/12/2018   AST 24 03/12/2018   NA 140 03/12/2018   K 4.3 03/12/2018   CL 103 03/12/2018   CREATININE 0.78 03/12/2018   BUN 13 03/12/2018   CO2 28 03/12/2018   TSH 1.46 03/12/2018   INR 1.1 (H) 10/20/2016    MM 3D SCREEN BREAST BILATERAL  Result Date: 03/17/2018 CLINICAL DATA:  Screening. EXAM: DIGITAL SCREENING BILATERAL MAMMOGRAM WITH TOMO AND CAD COMPARISON:  Previous exam(s). ACR Breast Density Category c: The breast tissue is heterogeneously dense, which may obscure small masses. FINDINGS: There are no findings suspicious for malignancy. Images were processed with CAD. IMPRESSION: No mammographic evidence of malignancy. A result letter of this screening mammogram will be mailed directly to the patient. RECOMMENDATION: Screening mammogram in one year. (Code:SM-B-01Y) BI-RADS CATEGORY  1: Negative. Electronically Signed   By: Lajean Manes M.D.   On: 03/17/2018 15:56    Assessment & Plan:   There are no diagnoses linked to this encounter.   No orders of the defined types were placed in this encounter.    Follow-up: No follow-ups on file.  Heather Kehr, MD

## 2019-05-09 NOTE — Telephone Encounter (Signed)
I spoke with pt on 4/23

## 2019-05-10 ENCOUNTER — Encounter: Payer: Self-pay | Admitting: Internal Medicine

## 2019-05-20 ENCOUNTER — Other Ambulatory Visit (INDEPENDENT_AMBULATORY_CARE_PROVIDER_SITE_OTHER): Payer: 59

## 2019-05-20 DIAGNOSIS — Z0000 Encounter for general adult medical examination without abnormal findings: Secondary | ICD-10-CM

## 2019-05-20 LAB — LIPID PANEL
Cholesterol: 217 mg/dL — ABNORMAL HIGH (ref 0–200)
HDL: 67 mg/dL (ref >39.00)
LDL Cholesterol: 138 mg/dL — ABNORMAL HIGH (ref 0–99)
NonHDL: 149.57
Total CHOL/HDL Ratio: 3
Triglycerides: 59 mg/dL (ref 0.0–149.0)
VLDL: 11.8 mg/dL (ref 0.0–40.0)

## 2019-05-20 LAB — CBC WITH DIFFERENTIAL/PLATELET
Basophils Absolute: 0.1 10*3/uL (ref 0.0–0.1)
Basophils Relative: 0.8 % (ref 0.0–3.0)
Eosinophils Absolute: 0.4 10*3/uL (ref 0.0–0.7)
Eosinophils Relative: 5.6 % — ABNORMAL HIGH (ref 0.0–5.0)
HCT: 40.4 % (ref 36.0–46.0)
Hemoglobin: 13.9 g/dL (ref 12.0–15.0)
Lymphocytes Relative: 40.9 % (ref 12.0–46.0)
Lymphs Abs: 2.8 10*3/uL (ref 0.7–4.0)
MCHC: 34.5 g/dL (ref 30.0–36.0)
MCV: 93.1 fl (ref 78.0–100.0)
Monocytes Absolute: 0.4 10*3/uL (ref 0.1–1.0)
Monocytes Relative: 6 % (ref 3.0–12.0)
Neutro Abs: 3.2 10*3/uL (ref 1.4–7.7)
Neutrophils Relative %: 46.7 % (ref 43.0–77.0)
Platelets: 342 10*3/uL (ref 150.0–400.0)
RBC: 4.34 Mil/uL (ref 3.87–5.11)
RDW: 12.5 % (ref 11.5–15.5)
WBC: 6.8 10*3/uL (ref 4.0–10.5)

## 2019-05-20 LAB — HEPATIC FUNCTION PANEL
ALT: 15 U/L (ref 0–35)
AST: 24 U/L (ref 0–37)
Albumin: 4.4 g/dL (ref 3.5–5.2)
Alkaline Phosphatase: 53 U/L (ref 39–117)
Bilirubin, Direct: 0.1 mg/dL (ref 0.0–0.3)
Total Bilirubin: 0.6 mg/dL (ref 0.2–1.2)
Total Protein: 7.2 g/dL (ref 6.0–8.3)

## 2019-05-20 LAB — URINALYSIS, ROUTINE W REFLEX MICROSCOPIC
Bilirubin Urine: NEGATIVE
Ketones, ur: NEGATIVE
Nitrite: NEGATIVE
Specific Gravity, Urine: 1.01 (ref 1.000–1.030)
Total Protein, Urine: NEGATIVE
Urine Glucose: NEGATIVE
Urobilinogen, UA: 0.2 (ref 0.0–1.0)
pH: 7 (ref 5.0–8.0)

## 2019-05-20 LAB — BASIC METABOLIC PANEL
BUN: 12 mg/dL (ref 6–23)
CO2: 29 mEq/L (ref 19–32)
Calcium: 9.2 mg/dL (ref 8.4–10.5)
Chloride: 104 mEq/L (ref 96–112)
Creatinine, Ser: 0.8 mg/dL (ref 0.40–1.20)
GFR: 74.24 mL/min (ref >60.00)
Glucose, Bld: 102 mg/dL — ABNORMAL HIGH (ref 70–99)
Potassium: 3.9 mEq/L (ref 3.5–5.1)
Sodium: 138 mEq/L (ref 135–145)

## 2019-05-20 LAB — TSH: TSH: 1.46 u[IU]/mL (ref 0.35–4.50)

## 2019-05-24 ENCOUNTER — Other Ambulatory Visit: Payer: Self-pay | Admitting: Internal Medicine

## 2019-05-24 DIAGNOSIS — Z1231 Encounter for screening mammogram for malignant neoplasm of breast: Secondary | ICD-10-CM

## 2019-05-26 ENCOUNTER — Ambulatory Visit
Admission: RE | Admit: 2019-05-26 | Discharge: 2019-05-26 | Disposition: A | Discharge status: Home / Self Care | Payer: 59 | Source: Ambulatory Visit

## 2019-05-26 ENCOUNTER — Other Ambulatory Visit: Payer: Self-pay

## 2019-05-26 DIAGNOSIS — Z1231 Encounter for screening mammogram for malignant neoplasm of breast: Secondary | ICD-10-CM

## 2019-07-19 ENCOUNTER — Encounter (HOSPITAL_COMMUNITY): Payer: Self-pay

## 2019-07-19 ENCOUNTER — Observation Stay (HOSPITAL_COMMUNITY)
Admission: EM | Admit: 2019-07-19 | Discharge: 2019-07-20 | Disposition: A | Discharge status: Home / Self Care | Payer: 59 | Attending: Internal Medicine | Admitting: Internal Medicine

## 2019-07-19 DIAGNOSIS — Z23 Encounter for immunization: Secondary | ICD-10-CM | POA: Diagnosis not present

## 2019-07-19 DIAGNOSIS — Y92838 Other recreation area as the place of occurrence of the external cause: Secondary | ICD-10-CM | POA: Diagnosis not present

## 2019-07-19 DIAGNOSIS — Y999 Unspecified external cause status: Secondary | ICD-10-CM | POA: Insufficient documentation

## 2019-07-19 DIAGNOSIS — T63001A Toxic effect of unspecified snake venom, accidental (unintentional), initial encounter: Secondary | ICD-10-CM | POA: Diagnosis present

## 2019-07-19 DIAGNOSIS — S90871A Other superficial bite of right foot, initial encounter: Secondary | ICD-10-CM | POA: Diagnosis present

## 2019-07-19 DIAGNOSIS — Z20822 Contact with and (suspected) exposure to covid-19: Secondary | ICD-10-CM | POA: Diagnosis not present

## 2019-07-19 DIAGNOSIS — R791 Abnormal coagulation profile: Secondary | ICD-10-CM | POA: Insufficient documentation

## 2019-07-19 DIAGNOSIS — Y9301 Activity, walking, marching and hiking: Secondary | ICD-10-CM | POA: Insufficient documentation

## 2019-07-19 DIAGNOSIS — F909 Attention-deficit hyperactivity disorder, unspecified type: Secondary | ICD-10-CM | POA: Diagnosis not present

## 2019-07-19 DIAGNOSIS — W5911XA Bitten by nonvenomous snake, initial encounter: Secondary | ICD-10-CM | POA: Diagnosis present

## 2019-07-19 DIAGNOSIS — F988 Other specified behavioral and emotional disorders with onset usually occurring in childhood and adolescence: Secondary | ICD-10-CM | POA: Diagnosis present

## 2019-07-19 LAB — CBC WITH DIFFERENTIAL/PLATELET
Abs Immature Granulocytes: 0.05 10*3/uL (ref 0.00–0.07)
Basophils Absolute: 0 10*3/uL (ref 0.0–0.1)
Basophils Relative: 0 %
Eosinophils Absolute: 0.1 10*3/uL (ref 0.0–0.5)
Eosinophils Relative: 0 %
HCT: 43.4 % (ref 36.0–46.0)
Hemoglobin: 14.4 g/dL (ref 12.0–15.0)
Immature Granulocytes: 0 %
Lymphocytes Relative: 15 %
Lymphs Abs: 2.4 10*3/uL (ref 0.7–4.0)
MCH: 31 pg (ref 26.0–34.0)
MCHC: 33.2 g/dL (ref 30.0–36.0)
MCV: 93.3 fL (ref 80.0–100.0)
Monocytes Absolute: 0.6 10*3/uL (ref 0.1–1.0)
Monocytes Relative: 4 %
Neutro Abs: 13 10*3/uL — ABNORMAL HIGH (ref 1.7–7.7)
Neutrophils Relative %: 81 %
Platelets: 354 10*3/uL (ref 150–400)
RBC: 4.65 MIL/uL (ref 3.87–5.11)
RDW: 12 % (ref 11.5–15.5)
WBC: 16.1 10*3/uL — ABNORMAL HIGH (ref 4.0–10.5)
nRBC: 0 % (ref 0.0–0.2)

## 2019-07-19 LAB — PROTIME-INR
INR: 1.1 (ref 0.8–1.2)
Prothrombin Time: 13.3 seconds (ref 11.4–15.2)

## 2019-07-19 LAB — COMPREHENSIVE METABOLIC PANEL
ALT: 15 U/L (ref 0–44)
AST: 26 U/L (ref 15–41)
Albumin: 3.9 g/dL (ref 3.5–5.0)
Alkaline Phosphatase: 50 U/L (ref 38–126)
Anion gap: 8 (ref 5–15)
BUN: 8 mg/dL (ref 6–20)
CO2: 26 mmol/L (ref 22–32)
Calcium: 8.9 mg/dL (ref 8.9–10.3)
Chloride: 105 mmol/L (ref 98–111)
Creatinine, Ser: 0.78 mg/dL (ref 0.44–1.00)
GFR calc Af Amer: >60 mL/min (ref >60)
GFR calc non Af Amer: >60 mL/min (ref >60)
Glucose, Bld: 97 mg/dL (ref 70–99)
Potassium: 3.9 mmol/L (ref 3.5–5.1)
Sodium: 139 mmol/L (ref 135–145)
Total Bilirubin: 0.5 mg/dL (ref 0.3–1.2)
Total Protein: 6.7 g/dL (ref 6.5–8.1)

## 2019-07-19 LAB — I-STAT BETA HCG BLOOD, ED (MC, WL, AP ONLY): I-stat hCG, quantitative: <5 m[IU]/mL (ref <5)

## 2019-07-19 LAB — SARS CORONAVIRUS 2 BY RT PCR (HOSPITAL ORDER, PERFORMED IN ~~LOC~~ HOSPITAL LAB): SARS Coronavirus 2: NEGATIVE

## 2019-07-19 LAB — FIBRINOGEN
Fibrinogen: 387 mg/dL (ref 210–475)
Fibrinogen: 372 mg/dL (ref 210–475)

## 2019-07-19 MED ORDER — ONDANSETRON HCL 4 MG/2ML IJ SOLN: 4.0000 mg | Freq: Four times a day (QID) | INTRAMUSCULAR | Status: DC | PRN

## 2019-07-19 MED ORDER — SODIUM CHLORIDE 0.9 % IV SOLN: Freq: Once | INTRAVENOUS | Status: AC

## 2019-07-19 MED ORDER — SODIUM CHLORIDE 0.9 % IV SOLN
2.0000 | INTRAVENOUS | Status: AC
  Administered 2019-07-19: 2 via INTRAVENOUS
  Filled 2019-07-19: qty 36

## 2019-07-19 MED ORDER — ONDANSETRON HCL 4 MG PO TABS: 4.0000 mg | ORAL_TABLET | Freq: Four times a day (QID) | ORAL | Status: DC | PRN

## 2019-07-19 MED ORDER — CROTALIDAE POLYVAL IMMUNE FAB IV SOLR
2.0000 | Freq: Once | INTRAVENOUS | Status: DC
  Filled 2019-07-19: qty 36

## 2019-07-19 MED ORDER — MORPHINE SULFATE (PF) 4 MG/ML IV SOLN
4.0000 mg | Freq: Once | INTRAVENOUS | Status: AC
  Administered 2019-07-19: 4 mg via INTRAVENOUS
  Filled 2019-07-19: qty 1

## 2019-07-19 MED ORDER — TETANUS-DIPHTH-ACELL PERTUSSIS 5-2.5-18.5 LF-MCG/0.5 IM SUSP
0.5000 mL | Freq: Once | INTRAMUSCULAR | Status: AC
  Administered 2019-07-19: 0.5 mL via INTRAMUSCULAR
  Filled 2019-07-19: qty 0.5

## 2019-07-19 NOTE — ED Provider Notes (Signed)
Emergency Department Provider Note   I have reviewed the triage vital signs and the nursing notes.   HISTORY  Chief Complaint Snake Bite   HPI Heather Freeman is a 54 y.o. female with past history reviewed below presents to the emergency department with snake bite to the right foot.  The patient was at [facility] in [place] this AM when she was walking through some brush E area when she felt a bite to the right foot.  She did not see a snake but noticed 2 puncture wounds to the right foot.  She presented to the [facility] emergency department locally and was evaluated and given CroFab (2 vials) for presumed venomous snake bite.  In review of the notes provided at transfer the patient's case was discussed with the [facility].  They recommended possibly additional CroFab dosing and overnight observation.  The patient requested to be admitted closer to her home in [place] and thus was transferred. Denies numbness. Initial bite occurred at 12:30 PM and patient applied ice to the area.    Past Medical History:  Diagnosis Date  . ADD (attention deficit disorder)   . Allergy   . Anxiety   . Menstrual cramps     Patient Active Problem List   Diagnosis Date Noted  . Snake envenomation 07/19/2019  . Snake bite 07/19/2019  . 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 Acute cystitis 06/24/2018  . Migraine headache 02/11/2017  . Skin foreign body 06/02/2016  . Petechial rash 02/19/2016  . Generalized anxiety disorder 11/02/2015  . Well adult exam 10/26/2014  . Menopausal sweats 10/26/2014  . Grief 10/26/2014  . Bursitis of elbow 12/12/2013  . Rotator cuff tear 04/27/2013  . 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 Subacromial bursitis 02/18/2013  . Pain in joint, shoulder region 02/15/2013  . Rash 11/12/2011  . Chalazion of right upper eyelid 07/09/2010  . Contusion of jaw 07/09/2010  . Rosacea 03/13/2008  . RASH AND OTHER NONSPECIFIC SKIN ERUPTION 03/13/2008  .  Attention deficit disorder 11/18/2006  . Allergic rhinitis 11/18/2006    Past Surgical History:  Procedure Laterality Date  . BREAST BIOPSY     benign  . ENDOMETRIAL ABLATION  07/04/09  . LASER ABLATION  2011   Uterine Ablation -Dr. Kem Kays  . SHOULDER SURGERY  april 2015    Allergies Other, Bee venom, and Mold extract [trichophyton]  History reviewed. No pertinent family history.  Social History Social History   Tobacco Use  . Smoking status: Never Smoker  . Smokeless tobacco: Never Used  Substance Use Topics  . Alcohol use: Yes    Alcohol/week: 6.0 standard drinks    Types: 6 Cans of beer per week  . Drug use: No    Review of Systems  Constitutional: No fever/chills Eyes: No visual changes. ENT: No sore throat. Cardiovascular: Denies chest pain. Respiratory: Denies shortness of breath. Gastrointestinal: No abdominal pain.  No nausea, no vomiting.  No diarrhea.  No constipation. Genitourinary: Negative for dysuria. Musculoskeletal: Negative for back pain. Positive right foot swelling and pain.  Skin: Negative for rash. Neurological: Negative for headaches, focal weakness or numbness.  10-point ROS otherwise negative.  ____________________________________________   PHYSICAL EXAM:  VITAL SIGNS: ED Triage Vitals [07/19/19 1852]  Enc Vitals Group     BP 120/77     Pulse Rate 79     Resp 14     Temp 98.3 F (36.8 C)     Temp Source Oral  SpO2 100 %     Weight 146 lb (66.2 kg)     Height 5\' 6"  (1.676 m)   Constitutional: Alert and oriented. Well appearing and in no acute distress. Eyes: Conjunctivae are normal. Head: Atraumatic. Nose: No congestion/rhinnorhea. Mouth/Throat: Mucous membranes are moist.  Neck: No stridor.  Cardiovascular: Normal rate, regular rhythm. Good peripheral circulation. Grossly normal heart sounds.   Respiratory: Normal respiratory effort.  No retractions. Lungs CTAB. Gastrointestinal: Soft and nontender. No distention.    Musculoskeletal: Swelling and tenderness diffusely over the right foot with swelling extending just above the right ankle.  Neurologic:  Normal speech and language. No gross focal neurologic deficits are appreciated. Normal sensation in the right foot.  Skin:  Skin is warm and dry. Two visible puncture wounds to the right lateral foot.   ____________________________________________   LABS (all labs ordered are listed, but only abnormal results are displayed)  Labs Reviewed  CBC WITH DIFFERENTIAL/PLATELET - Abnormal; Notable for the following components:      Result Value   WBC 16.1 (*)    Neutro Abs 13.0 (*)    All other components within normal limits  SARS CORONAVIRUS 2 BY RT PCR (HOSPITAL ORDER, PERFORMED IN [facility])  COMPREHENSIVE METABOLIC PANEL  PROTIME-INR  FIBRINOGEN  FIBRINOGEN  FIBRINOGEN  HIV ANTIBODY (ROUTINE TESTING W REFLEX)  BASIC METABOLIC PANEL  CBC  I-STAT BETA HCG BLOOD, ED ([facility], [facility], [facility] ONLY)   ____________________________________________  RADIOLOGY  None  ____________________________________________   PROCEDURES  Procedure(s) performed:   Procedures  None  ____________________________________________   INITIAL IMPRESSION / ASSESSMENT AND PLAN / ED COURSE  Pertinent labs & imaging results that were available during my care of the patient were reviewed by me and considered in my medical decision making (see chart for details).   Patient presents emergency department for evaluation after snakebite.  Patient has received 2 vials of CroFab at outside facility.  We will touch base with poison control to coordinate additional doses.  No extension of swelling beyond the initial markings. No AMS. No compartment syndrome concern clinically.   07:19 PM  Spoke with poison control to follow-up regarding the case.  According to documentation with the patient appears she received 2 vials of CroFab as opposed to be 4-6 which is more standard.   She did apply ice to the wound immediately afterwards as well.  She has focal swelling but this is not extending beyond the lines drawn previously.  Poison control recommends additional 2 vials of CroFab along with labs and overnight observation.  Pain medication as needed.  Tetanus updated. Foot to be elevated 8-12 inches above the heart.   08:50 PM  Discussed patient's case with [facility], Dr. to request admission. Patient and family (if present) updated with plan. Care transferred to [facility] service.  I reviewed all nursing notes, vitals, pertinent old records, EKGs, labs, imaging (as available).  ____________________________________________  FINAL CLINICAL IMPRESSION(S) / ED DIAGNOSES  Final diagnoses:  Snake bite, initial encounter     MEDICATIONS GIVEN DURING THIS VISIT:  Medications  0.9 %  sodium chloride infusion (has no administration in time range)  crotalidae polyvalent immune fab (CROFAB) 2 vial in sodium chloride 0.9 % 250 mL infusion (has no administration in time range)  ondansetron (ZOFRAN) tablet 4 mg (has no administration in time range)    Or  ondansetron (ZOFRAN) injection 4 mg (has no administration in time range)  Tdap (BOOSTRIX) injection 0.5 mL (0.5 mLs Intramuscular  Given 07/19/19 1940)  morphine 4 MG/ML injection 4 mg (4 mg Intravenous Given 07/19/19 1938)     Note:  This document was prepared using Dragon voice recognition software and may include unintentional dictation errors.  Alona Bene, MD, [facility] Emergency Medicine    Otoniel Myhand, Arlyss Repress, MD 07/22/19 [phone]

## 2019-07-19 NOTE — ED Triage Notes (Addendum)
Pt arrives to ED w/ c/o snake bite to R foot that occurred at 1230. Pt did not see snake. Edema and redness to R foot. Pt reports 5/10 pain. EMS VSS

## 2019-07-19 NOTE — ED Notes (Signed)
Wait 15 minutes with Pt no reaction and states she feels fine. IV sight looks good

## 2019-07-20 DIAGNOSIS — T63001A Toxic effect of unspecified snake venom, accidental (unintentional), initial encounter: Secondary | ICD-10-CM | POA: Diagnosis not present

## 2019-07-20 LAB — BASIC METABOLIC PANEL
Anion gap: 9 (ref 5–15)
BUN: 8 mg/dL (ref 6–20)
CO2: 23 mmol/L (ref 22–32)
Calcium: 8.3 mg/dL — ABNORMAL LOW (ref 8.9–10.3)
Chloride: 106 mmol/L (ref 98–111)
Creatinine, Ser: 0.7 mg/dL (ref 0.44–1.00)
GFR calc Af Amer: >60 mL/min (ref >60)
GFR calc non Af Amer: >60 mL/min (ref >60)
Glucose, Bld: 103 mg/dL — ABNORMAL HIGH (ref 70–99)
Potassium: 3.6 mmol/L (ref 3.5–5.1)
Sodium: 138 mmol/L (ref 135–145)

## 2019-07-20 LAB — CBC
HCT: 40.3 % (ref 36.0–46.0)
Hemoglobin: 13.8 g/dL (ref 12.0–15.0)
MCH: 31.6 pg (ref 26.0–34.0)
MCHC: 34.2 g/dL (ref 30.0–36.0)
MCV: 92.2 fL (ref 80.0–100.0)
Platelets: 313 10*3/uL (ref 150–400)
RBC: 4.37 MIL/uL (ref 3.87–5.11)
RDW: 12 % (ref 11.5–15.5)
WBC: 11.2 10*3/uL — ABNORMAL HIGH (ref 4.0–10.5)
nRBC: 0 % (ref 0.0–0.2)

## 2019-07-20 LAB — HIV ANTIBODY (ROUTINE TESTING W REFLEX): HIV Screen 4th Generation wRfx: NONREACTIVE

## 2019-07-20 LAB — FIBRINOGEN: Fibrinogen: 377 mg/dL (ref 210–475)

## 2019-07-20 MED ORDER — AMPHETAMINE-DEXTROAMPHET ER 10 MG PO CP24
10.0000 mg | ORAL_CAPSULE | Freq: Every day | ORAL | Status: DC
  Administered 2019-07-20: 10 mg via ORAL
  Filled 2019-07-20: qty 1

## 2019-07-20 MED ORDER — AMOXICILLIN-POT CLAVULANATE 875-125 MG PO TABS: 1.0000 | ORAL_TABLET | Freq: Two times a day (BID) | ORAL | 0 refills | Status: AC

## 2019-07-20 MED ORDER — SODIUM CHLORIDE 0.9 % IV SOLN
2.0000 | INTRAVENOUS | Status: AC
  Administered 2019-07-20: 2 via INTRAVENOUS
  Filled 2019-07-20: qty 36

## 2019-07-20 NOTE — Progress Notes (Signed)
PROGRESS NOTE  Heather Freeman QBH:[id number] DOB: Jun 29, 1965 DOA: 07/19/2019 PCP: Tresa Garter, MD  HPI/Recap of past 24 hours:  Heather Freeman is a 54 y.o. female with history of ADD and migraine had gone to visit her house in [place] and works in the yard when she accidentally stepped onto some shrubs when she got bit.  Initially she thought it could be a bee sting.  But found that she had 2 fang marks on the right foot and went to the ER nearby.  By then patient's foot started swelling and hurting.  The swelling started crossing the ankle.  Patient was given 2 vials of CroFab and advised to come to the ER at [facility] since patient lives in [place].  Patient otherwise denies any chest pain shortness of breath fever chills.  ED Course: In the ER patient swelling at that time minimally progressed.  Patient is able to make movements of the right ankle and no difficulty moving the right knee.  Poison control was contacted and patient was given 2 more doses of CroFab were given.  I have reviewed the labs done at the the ER which were largely unremarkable.  Labs in our ER shows WBC of 16.1.  At the time of my exam patient was stating that her right calf muscle is swelling up more than before.  07/20/19: Seen and examined.  Reports pain in her right lower extremity is well controlled on current pain management.  She has been compliant with elevating her leg to decrease swelling.  No evidence of skin breakdown at this time, will hold off Unasyn for now and continue to monitor.  We will consult orthopedic surgery if suspect compartment syndrome, no evidence at this time.  Assessment/Plan: Principal Problem:   Snake envenomation Active Problems:   Attention deficit disorder   Snake bite  1. Snake envenomation, suspected copperhead bite -the type of snake is not known.  After discussing with poison control since patient swelling is progressing to the calf muscle at this time  poison control advised to give 2 more vials of CroFab and this will total up to 6 vials now.  Also advised to closely monitor the size of the right leg which I have instructed the nurse on call for the patient.  As per the poison control instruction we will be mentioning the right leg every 4 hours.  Keep the right leg elevated.  Fibrinogen levels PT/INR and CBCs have been stable. 2. History of ADD on Adderall patient states she takes 10 mg daily. 3. Leukocytosis likely reactionary. 4. History of migraine.   DVT prophylaxis: Patient is not on Lovenox or SCDs due to leg swelling and we are following the coagulation panel due to the snakebite. Code Status: Full code. Family Communication: Discussed with patient. Disposition Plan: Home when stable. Consults called: None. Admission status: Observation.    Status is: Observation    Dispo: The patient is from: Home.               Anticipated d/c is to: Home.               Anticipated d/c date is: 07/21/2019.               Patient currently not stable for discharge due to close monitoring of symptomatology post suspected copperhead bite       Objective: Vitals:   07/19/19 2242 07/20/19 0131 07/20/19 0455 07/20/19 0843  BP: 122/68 131/68 101/64  103/66  Pulse: 68 65 66 67  Resp: 16 16 14    Temp: 97.7 F (36.5 C) 98.2 F (36.8 C) 98.4 F (36.9 C) 98.2 F (36.8 C)  TempSrc: Oral Oral  Oral  SpO2: 100% 100% 96% 97%  Weight: 70.2 kg     Height: 5\' 6"  (1.676 m)      No intake or output data in the 24 hours ending 07/20/19 1318 Filed Weights   07/19/19 1852 07/19/19 2242  Weight: 66.2 kg 70.2 kg    Exam:  . General: 54 y.o. year-old female well developed well nourished in no acute distress.  Alert and oriented x3. . Cardiovascular: Regular rate and rhythm with no rubs or gallops.  No thyromegaly or JVD noted.   09/19/19 Respiratory: Clear to auscultation with no wheezes or rales. Good inspiratory effort. . Abdomen: Soft nontender  nondistended with normal bowel sounds x4 quadrants. . Musculoskeletal: Right lower extremity double the size of the left.  2/4 pulses in all 4 extremities. 59 Psychiatry: Mood is appropriate for condition and setting   Data Reviewed: CBC: Recent Labs  Lab 07/19/19 1933 07/20/19 0320  WBC 16.1* 11.2*  NEUTROABS 13.0*  --   HGB 14.4 13.8  HCT 43.4 40.3  MCV 93.3 92.2  PLT 354 313   Basic Metabolic Panel: Recent Labs  Lab 07/19/19 1933 07/20/19 0320  NA 139 138  K 3.9 3.6  CL 105 106  CO2 26 23  GLUCOSE 97 103*  BUN 8 8  CREATININE 0.78 0.70  CALCIUM 8.9 8.3*   GFR: Estimated Creatinine Clearance: 73.5 mL/min (by C-G formula based on SCr of 0.7 mg/dL). Liver Function Tests: Recent Labs  Lab 07/19/19 1933  AST 26  ALT 15  ALKPHOS 50  BILITOT 0.5  PROT 6.7  ALBUMIN 3.9   No results for input(s): LIPASE, AMYLASE in the last 168 hours. No results for input(s): AMMONIA in the last 168 hours. Coagulation Profile: Recent Labs  Lab 07/19/19 1933  INR 1.1   Cardiac Enzymes: No results for input(s): CKTOTAL, CKMB, CKMBINDEX, TROPONINI in the last 168 hours. BNP (last 3 results) No results for input(s): PROBNP in the last 8760 hours. HbA1C: No results for input(s): HGBA1C in the last 72 hours. CBG: No results for input(s): GLUCAP in the last 168 hours. Lipid Profile: No results for input(s): CHOL, HDL, LDLCALC, TRIG, CHOLHDL, LDLDIRECT in the last 72 hours. Thyroid Function Tests: No results for input(s): TSH, T4TOTAL, FREET4, T3FREE, THYROIDAB in the last 72 hours. Anemia Panel: No results for input(s): VITAMINB12, FOLATE, FERRITIN, TIBC, IRON, RETICCTPCT in the last 72 hours. Urine analysis:    Component Value Date/Time   COLORURINE YELLOW 05/20/2019 0905   APPEARANCEUR CLEAR 05/20/2019 0905   LABSPEC 1.010 05/20/2019 0905   PHURINE 7.0 05/20/2019 0905   GLUCOSEU NEGATIVE 05/20/2019 0905   HGBUR TRACE-LYSED (A) 05/20/2019 0905   BILIRUBINUR NEGATIVE  05/20/2019 0905   KETONESUR NEGATIVE 05/20/2019 0905   PROTEINUR NEGATIVE 04/26/2019 1439   UROBILINOGEN 0.2 05/20/2019 0905   NITRITE NEGATIVE 05/20/2019 0905   LEUKOCYTESUR SMALL (A) 05/20/2019 0905   Sepsis Labs: @LABRCNTIP (procalcitonin:4,lacticidven:4)  ) Recent Results (from the past 240 hour(s))  SARS Coronavirus 2 by RT PCR (hospital order, performed in [facility] hospital lab) Nasopharyngeal Nasopharyngeal Swab     Status: None   Collection Time: 07/19/19  7:45 PM   Specimen: Nasopharyngeal Swab  Result Value Ref Range Status   SARS Coronavirus 2 NEGATIVE NEGATIVE Final    Comment: (  NOTE) SARS-CoV-2 target nucleic acids are NOT DETECTED.  The SARS-CoV-2 RNA is generally detectable in upper and lower respiratory specimens during the acute phase of infection. The lowest concentration of SARS-CoV-2 viral copies this assay can detect is 250 copies / mL. A negative result does not preclude SARS-CoV-2 infection and should not be used as the sole basis for treatment or other patient management decisions.  A negative result may occur with improper specimen collection / handling, submission of specimen other than nasopharyngeal swab, presence of viral mutation(s) within the areas targeted by this assay, and inadequate number of viral copies (<250 copies / mL). A negative result must be combined with clinical observations, patient history, and epidemiological information.  Fact Sheet for Patients:   BoilerBrush.com.cy  Fact Sheet for Healthcare Providers: https://pope.com/  This test is not yet approved or  cleared by the [place] FDA and has been authorized for detection and/or diagnosis of SARS-CoV-2 by FDA under an Emergency Use Authorization (EUA).  This EUA will remain in effect (meaning this test can be used) for the duration of the COVID-19 declaration under Section 564(b)(1) of the Act, 21 U.S.C. section  360bbb-3(b)(1), unless the authorization is terminated or revoked sooner.  Performed at [facility], [address] [place], [place] [address]       Studies: No results found.  Scheduled Meds: . amphetamine-dextroamphetamine  10 mg Oral Daily    Continuous Infusions:   LOS: 0 days     Darlin Drop, MD [facility] Pager [phone]  If 7PM-7AM, please contact night-coverage [email or web address] Password [email or web address] 07/20/2019, 1:18 PM

## 2019-07-20 NOTE — Progress Notes (Signed)
Discharged patient to home, AVS given and explained. Belongings returned.

## 2019-07-20 NOTE — Progress Notes (Signed)
Swelling to right foot subsided, calf area now more swollen than previously measuring 38.7cm when measure at 0155hrs. Will administer Crofab and then recheck circumference 1hr later as ordered. Will recheck q4hrs for 24 hrs as ordered afterwards. Pt denies pain at this time,elevating extremity on 2 pillows as advised by poison control center

## 2019-07-20 NOTE — Progress Notes (Addendum)
Measured calf area and it showed 37 cm, swelling went down

## 2019-07-20 NOTE — Discharge Instructions (Signed)
Snake Bite Snake bite is an injury to the skin or the deeper tissues beneath the skin that is caused by a snake. There are two types of snakes: poisonous (venomous) and nonpoisonous (nonvenomous). A nonvenomous snake will cause a wound. A venomous snake will cause a wound and may also inject poison (venom) into the wound. The effects of snake venom vary depending on the type of snake. In some cases, the effects can be extremely serious or even deadly. A bite from a venomous snake is a medical emergency. Treatment may require the use of antivenom medicine. What are the causes? This injury is caused by a venomous snake or a nonvenomous snake. What increases the risk? You are more likely to get a snake bite if:  You walk or hike in outdoor areas.  You do not cover your arms and legs with clothing when hiking.  You provoke or try to pick up a snake. What are the signs or symptoms? Symptoms of a snake bite vary depending on the type of snake, whether the snake is venomous, and the severity of the bite.  Symptoms for both a venomous or nonvenomous snake may include:  Pain, redness, and swelling at the site of the bite.  Skin discoloration at the site of the bite.  A feeling of nervousness. Symptoms of a venomous snake bite may also include:  Increasing pain and swelling.  Severe anxiety or confusion.  Blood blisters or purple spots in the bite area.  Nausea and vomiting.  Numbness or tingling.  Muscle weakness.  Excessive fatigue or drowsiness.  Excessive sweating.  Difficulty breathing.  Blurred vision.  Bruising and bleeding at the site of the bite.  Feeling faint or light-headed. In some cases, symptoms do not develop until a few hours after the bite. How is this diagnosed? This condition may be diagnosed based on symptoms and a physical exam. Your health care provider will examine the bite area and ask for details about the snake to help determine whether it is  venomous. You may also have tests, including blood tests. How is this treated? Treatment depends on the severity of the bite and whether the snake is venomous.  Treatment for nonvenomous snake bites may include basic wound care. This includes cleaning the wound and applying a bandage (dressing).  Treatment for venomous snake bites may include antivenom medicine in addition to wound care. This medicine needs to be given as soon as possible after the bite. Other treatments may be needed to help control symptoms as they develop. You may need to stay in a hospital so your condition can be monitored. Your health care provider may prescribe antibiotic medicine to avoid infection in the wound. You may need a tetanus shot if it has been more than 5 years since you had one. Follow these instructions at home: Wound care   Follow instructions from your health care provider about how to take care of your wound. Make sure you: ? Wash your hands with soap and water before you change your dressing. If soap and water are not available, use hand sanitizer. ? Change your dressing as told by your health care provider.  Keep the wound clean and dry. Wash the wound daily with soap and water or a germ-killing (antiseptic) soap as told by your health care provider.  Check your wound every day for signs of infection. Watch for: ? Redness, swelling, or pain that is getting worse. ? Warmth. ? Fluid, blood, or pus.  If you   develop blistering at the site of the bite, protect the blisters from breaking. Do not attempt to open a blister.  Do not take baths, swim, or use a hot tub until your health care provider approves. Ask your health care provider if you may take showers. You may only be allowed to take sponge baths. Medicines  Take or apply over-the-counter and prescription medicines only as told by your health care provider.  If you were prescribed an antibiotic medicine, take or apply it as told by your  health care provider. Do not stop using the antibiotic even if your condition improves. General instructions  If possible, keep the affected area raised (elevated) above the level of your heart while you are sitting or lying down.  Keep all follow-up visits as told by your health care provider. This is important. Contact a health care provider if you have:  Increased redness, swelling, or pain at the site of your wound.  Fluid, blood, or pus coming from your wound.  A fever. Get help right away if:  You develop blood blisters or purple spots in the bite area.  You have: ? Nausea or vomiting. ? Numbness or tingling. ? Excessive sweating. ? Trouble breathing. ? Trouble seeing.  You feel very confused.  You feel faint or light-headed. Summary  Snake bite is an injury to the skin or the deeper tissues beneath the skin that is caused by a snake.  A nonvenomous snake will cause a wound. A venomous snake will cause a wound and may also inject poison (venom) into the wound.  The effects of snake venom vary depending on the type of snake.  Treatment depends on the severity of the bite and whether the snake is venomous. You may require antivenom or antibiotic medicine after a snake bite. This information is not intended to replace advice given to you by your health care provider. Make sure you discuss any questions you have with your health care provider. Document Revised: 06/17/2017 Document Reviewed: 06/17/2017 Elsevier Patient Education  2020 [employer]  

## 2019-07-20 NOTE — Discharge Summary (Signed)
Discharge Summary  Heather NapKathryn Joy Freeman ZOX:[id number] DOB: March 17, 1965  PCP: Tresa GarterPlotnikov, Aleksei V, MD  Admit date: 07/19/2019 Discharge date: 07/20/2019  Time spent: 35 minutes   Recommendations for Outpatient Follow-up:  1. Follow-up with your primary care provider within a week 2. Take your medications as prescribed 3. If skin breakdown develops please take Augmentin 1 tablet twice daily x7 days.  Discharge Diagnoses:  Active Hospital Problems   Diagnosis Date Noted  . Snake envenomation 07/19/2019  . Snake bite 07/19/2019  . Attention deficit disorder 11/18/2006    Resolved Hospital Problems  No resolved problems to display.    Discharge Condition: Stable  Diet recommendation: Resume previous diet  Vitals:   07/20/19 0455 07/20/19 0843  BP: 101/64 103/66  Pulse: 66 67  Resp: 14   Temp: 98.4 F (36.9 C) 98.2 F (36.8 C)  SpO2: 96% 97%    History of present illness:  Heather Freeman a 54 y.o.femalewithhistory of ADD and migraine had gone to visit her house in [place] and works in the yard when she accidentally stepped onto some shrubs when she got bit. Initially she thought it could be a bee sting. But found that she had 2 fang marks on the right foot and went to the ER nearby. By then patient's foot started swelling and hurting. The swelling started crossing the ankle. Patient was given 2 vials of CroFab and advised to come to the ER at [facility] since patient lives in [place]. Patient otherwise denies any chest pain shortness of breath fever chills.  ED Course:In the ER patient swelling at that time minimally progressed. Patient is able to make movements of the right ankle and no difficulty moving the right knee. Poison control was contacted and patient was given 2 more doses ofCroFab were given. I have reviewed the labs done at the the ER which were largely unremarkable. Labs in our ER shows WBC of 16.1. At the time of my exam patient  was stating that her right calf muscle is swelling up more than before.  07/20/19: Seen and examined.  Reports pain in her right lower extremity is minimal.  She has been compliant with elevating her leg to decrease swelling.  No evidence of skin breakdown at this time, will hold off Unasyn for now and continue to monitor.  We will consult orthopedic surgery if suspect compartment syndrome, no evidence at this time.  Patient requested to be discharged today.  Advised to carefully monitor her right lower extremity.  To take Augmentin 1 tablet twice daily x7 days if she develops skin breakdown.  To return to the ED if pain and edema worsen or if she develops any cardiopulmonary symptoms.  Her husband in the room states he will monitor as well.  Patient and husband understand and agree to plan.  Hospital Course:  Principal Problem:   Snake envenomation Active Problems:   Attention deficit disorder   Snake bite  1. Snake envenomation, suspected copperhead bite-the type of snake is not known. After discussing with poison control since patient swelling is progressing to the calf muscle at this time poison control advised to give 2 more vials of CroFab and this will total up to 6 vials now. Also advised to closely monitor the size of the right leg which I have instructed the nurse on call for the patient. As per the poison control instruction we will be mentioning the right leg every 4 hours. Keep the right leg elevated. Fibrinogen levels PT/INR  and CBCs have been stable.  Patient requested to be discharged today.  Advised to carefully monitor her right lower extremity.  To take Augmentin 1 tablet twice daily x7 days if she develops skin breakdown.  To return to the ED if pain and edema worsen or if she develops any cardiopulmonary symptoms.  Her husband in the room states he will monitor as well.  Patient and husband understand and agree to plan. Follow-up with your PCP within a week.  2. History of  ADD, stable on Adderall  3. Leukocytosis likely reactionary, trending down. 4. History of migraine, stable.    Procedures:  None  Consultations:  None  Discharge Exam: BP 103/66 (BP Location: Left Arm)   Pulse 67   Temp 98.2 F (36.8 C) (Oral)   Resp 14   Ht 5\' 6"  (1.676 m)   Wt 70.2 kg   LMP 06/13/2012   SpO2 97%   BMI 24.98 kg/m  . General: 54 y.o. year-old female well developed well nourished in no acute distress.  Alert and oriented x3. . Cardiovascular: Regular rate and rhythm with no rubs or gallops.  No thyromegaly or JVD noted.   59 Respiratory: Clear to auscultation with no wheezes or rales. Good inspiratory effort. . Abdomen: Soft nontender nondistended with normal bowel sounds x4 quadrants. . Musculoskeletal: Right lower extremity edema is improving.  Minimally tender.  Not erythematous.  2/4 pulses in all 4 extremities. . Skin: No ulcerative lesions noted or rashes. . Psychiatry: Mood is appropriate for condition and setting  Discharge Instructions You were cared for by a hospitalist during your hospital stay. If you have any questions about your discharge medications or the care you received while you were in the hospital after you are discharged, you can call the unit and asked to speak with the hospitalist on call if the hospitalist that took care of you is not available. Once you are discharged, your primary care physician will handle any further medical issues. Please note that NO REFILLS for any discharge medications will be authorized once you are discharged, as it is imperative that you return to your primary care physician (or establish a relationship with a primary care physician if you do not have one) for your aftercare needs so that they can reassess your need for medications and monitor your lab values.   Allergies as of 07/20/2019      Reactions   Other Shortness Of Breath, Other (See Comments)   Strong odors affect the breathing   Bee Venom Swelling,  Other (See Comments)   Severe swelling that takes a lengthy time to dissipate   Mold Extract [trichophyton] Other (See Comments)   Wheezing, headaches, and congestion       Medication List    TAKE these medications   amoxicillin-clavulanate 875-125 MG tablet Commonly known as: Augmentin Take 1 tablet by mouth 2 (two) times daily for 7 days.   amphetamine-dextroamphetamine 20 MG tablet Commonly known as: Adderall Take 1 tablet (20 mg total) by mouth 2 (two) times daily. AM and lunch What changed:   when to take this  additional instructions  Another medication with the same name was removed. Continue taking this medication, and follow the directions you see here. Notes to patient: Last given 7/7 at 10 am   cholecalciferol 25 MCG (1000 UNIT) tablet Commonly known as: VITAMIN D Take 1,000 Units by mouth daily.   cyclobenzaprine 5 MG tablet Commonly known as: FLEXERIL Take 1-2 tablets (5-10 mg total)  by mouth 3 (three) times daily as needed for muscle spasms.   GLUCOSAMINE-CHONDROITIN PO Take 1 tablet by mouth in the morning and at bedtime.   IMITREX PO Take 1 tablet by mouth See admin instructions. Take 1 tablet by mouth at onset of migraine and may take one additional tablet two hours later, if no relief (max 2 tablets/day)   loratadine 10 MG tablet Commonly known as: CLARITIN Take 10 mg by mouth daily as needed for allergies.   montelukast 10 MG tablet Commonly known as: SINGULAIR TAKE 1 TABLET BY MOUTH EVERY DAY What changed:   when to take this  reasons to take this   Prempro 0.625-5 MG tablet Generic drug: estrogen (conjugated)-medroxyprogesterone TAKE 1 TABLET BY MOUTH EVERY DAY What changed: how much to take      Allergies  Allergen Reactions  . Other Shortness Of Breath and Other (See Comments)    Strong odors affect the breathing  . Bee Venom Swelling and Other (See Comments)    Severe swelling that takes a lengthy time to dissipate  . Mold  Extract [Trichophyton] Other (See Comments)    Wheezing, headaches, and congestion     Follow-up Information    Plotnikov, Georgina Quint, MD. Call in 1 day(s).   Specialty: Internal Medicine Why: Please call for a post hospital follow-up appointment. Contact information: [address] [place] [address] [phone]                The results of significant diagnostics from this hospitalization (including imaging, microbiology, ancillary and laboratory) are listed below for reference.    Significant Diagnostic Studies: No results found.  Microbiology: Recent Results (from the past 240 hour(s))  SARS Coronavirus 2 by RT PCR (hospital order, performed in [facility] hospital lab) Nasopharyngeal Nasopharyngeal Swab     Status: None   Collection Time: 07/19/19  7:45 PM   Specimen: Nasopharyngeal Swab  Result Value Ref Range Status   SARS Coronavirus 2 NEGATIVE NEGATIVE Final    Comment: (NOTE) SARS-CoV-2 target nucleic acids are NOT DETECTED.  The SARS-CoV-2 RNA is generally detectable in upper and lower respiratory specimens during the acute phase of infection. The lowest concentration of SARS-CoV-2 viral copies this assay can detect is 250 copies / mL. A negative result does not preclude SARS-CoV-2 infection and should not be used as the sole basis for treatment or other patient management decisions.  A negative result may occur with improper specimen collection / handling, submission of specimen other than nasopharyngeal swab, presence of viral mutation(s) within the areas targeted by this assay, and inadequate number of viral copies (<250 copies / mL). A negative result must be combined with clinical observations, patient history, and epidemiological information.  Fact Sheet for Patients:   BoilerBrush.com.cy  Fact Sheet for Healthcare Providers: https://pope.com/  This test is not yet approved or  cleared by the  [place] FDA and has been authorized for detection and/or diagnosis of SARS-CoV-2 by FDA under an Emergency Use Authorization (EUA).  This EUA will remain in effect (meaning this test can be used) for the duration of the COVID-19 declaration under Section 564(b)(1) of the Act, 21 U.S.C. section 360bbb-3(b)(1), unless the authorization is terminated or revoked sooner.  Performed at [facility], [address] [place], [place] [address]      Labs: Basic Metabolic Panel: Recent Labs  Lab 07/19/19 1933 07/20/19 0320  NA 139 138  K 3.9 3.6  CL 105 106  CO2  26 23  GLUCOSE 97 103*  BUN 8 8  CREATININE 0.78 0.70  CALCIUM 8.9 8.3*   Liver Function Tests: Recent Labs  Lab 07/19/19 1933  AST 26  ALT 15  ALKPHOS 50  BILITOT 0.5  PROT 6.7  ALBUMIN 3.9   No results for input(s): LIPASE, AMYLASE in the last 168 hours. No results for input(s): AMMONIA in the last 168 hours. CBC: Recent Labs  Lab 07/19/19 1933 07/20/19 0320  WBC 16.1* 11.2*  NEUTROABS 13.0*  --   HGB 14.4 13.8  HCT 43.4 40.3  MCV 93.3 92.2  PLT 354 313   Cardiac Enzymes: No results for input(s): CKTOTAL, CKMB, CKMBINDEX, TROPONINI in the last 168 hours. BNP: BNP (last 3 results) No results for input(s): BNP in the last 8760 hours.  ProBNP (last 3 results) No results for input(s): PROBNP in the last 8760 hours.  CBG: No results for input(s): GLUCAP in the last 168 hours.     Signed:  Darlin Drop, MD [facility] 07/20/2019, 6:59 PM

## 2019-07-20 NOTE — Progress Notes (Signed)
Calf area measured from the same marking previously made. Calf measures 39 cm at 0936. Patient denies any pain, encouraged to continue elevating extremity as advised by poison control center. Will continue to monitor.

## 2019-07-20 NOTE — H&P (Signed)
History and Physical    Heather Freeman RUE:[id number] DOB: Apr 12, 1965 DOA: 07/19/2019  PCP: Tresa Garter, MD  Patient coming from: Home.  Chief Complaint: Possible snakebite.  HPI: Heather Freeman is a 54 y.o. female with history of ADD and migraine had gone to visit her house in [place] and works in the yard when she accidentally stepped onto some shrubs when she got bit.  Initially she thought it could be a bee sting.  But found that she had 2 fang marks on the right foot and went to the ER nearby.  By then patient's foot started swelling and hurting.  The swelling started crossing the ankle.  Patient was given 2 vials of CroFab and advised to come to the ER at [facility] since patient lives in [place].  Patient otherwise denies any chest pain shortness of breath fever chills.  ED Course: In the ER patient swelling at that time minimally progressed.  Patient is able to make movements of the right ankle and no difficulty moving the right knee.  Poison control was contacted and patient was given 2 more doses of CroFab were given.  I have reviewed the labs done at the the ER which were largely unremarkable.  Labs in our ER shows WBC of 16.1.  At the time of my exam patient was stating that her right calf muscle is swelling up more than before.  Review of Systems: As per HPI, rest all negative.   Past Medical History:  Diagnosis Date  . ADD (attention deficit disorder)   . Allergy   . Anxiety   . Menstrual cramps     Past Surgical History:  Procedure Laterality Date  . BREAST BIOPSY     benign  . ENDOMETRIAL ABLATION  07/04/09  . LASER ABLATION  2011   Uterine Ablation -Dr. Kem Kays  . SHOULDER SURGERY  april 2015     reports that she has never smoked. She has never used smokeless tobacco. She reports current alcohol use of about 6.0 standard drinks of alcohol per week. She reports that she does not use drugs.  Allergies  Allergen Reactions  . Other  Shortness Of Breath and Other (See Comments)    Strong odors affect the breathing  . Bee Venom Swelling and Other (See Comments)    Severe swelling that takes a lengthy time to dissipate  . Mold Extract [Trichophyton] Other (See Comments)    Wheezing, headaches, and congestion     History reviewed. No pertinent family history.  Prior to Admission medications   Medication Sig Start Date End Date Taking? Authorizing Provider  amphetamine-dextroamphetamine (ADDERALL) 20 MG tablet Take 1 tablet (20 mg total) by mouth 2 (two) times daily. AM and lunch Patient taking differently: Take 20 mg by mouth See admin instructions. Take 20 mg by mouth one to two times a day 05/03/19  Yes Plotnikov, Georgina Quint, MD  Cholecalciferol (VITAMIN D3) 1000 UNITS tablet Take 1,000 Units by mouth daily.     Yes [provider]  cyclobenzaprine (FLEXERIL) 5 MG tablet Take 1-2 tablets (5-10 mg total) by mouth 3 (three) times daily as needed for muscle spasms. 05/03/19  Yes Plotnikov, Georgina Quint, MD  GLUCOSAMINE-CHONDROITIN PO Take 1 tablet by mouth in the morning and at bedtime.   Yes [provider]  loratadine (CLARITIN) 10 MG tablet Take 10 mg by mouth daily as needed for allergies.    Yes [provider]  montelukast (SINGULAIR) 10 MG tablet  TAKE 1 TABLET BY MOUTH EVERY DAY Patient taking differently: Take 10 mg by mouth daily as needed (for seasonal allergies).  07/29/17  Yes Plotnikov, Georgina Quint, MD  PREMPRO 0.625-5 MG tablet TAKE 1 TABLET BY MOUTH EVERY DAY Patient taking differently: Take 0.5-1 tablets by mouth daily.  02/16/19  Yes Plotnikov, Georgina Quint, MD  SUMAtriptan Succinate (IMITREX PO) Take 1 tablet by mouth See admin instructions. Take 1 tablet by mouth at onset of migraine and may take one additional tablet two hours later, if no relief (max 2 tablets/day)   Yes [provider]  amphetamine-dextroamphetamine (ADDERALL) 20 MG tablet Take 1 tablet (20 mg total) by mouth 2 (two)  times daily. AM and lunch Patient not taking: Reported on 07/19/2019 05/03/19   Plotnikov, Georgina Quint, MD  amphetamine-dextroamphetamine (ADDERALL) 20 MG tablet Take 1 tablet (20 mg total) by mouth 2 (two) times daily. AM and lunch. Patient not taking: Reported on 07/19/2019 05/03/19   Plotnikov, Georgina Quint, MD    Physical Exam: Constitutional: Moderately built and nourished. Vitals:   07/19/19 2200 07/19/19 2242 07/20/19 0131 07/20/19 0455  BP: 111/64 122/68 131/68 101/64  Pulse: 66 68 65 66  Resp: 18 16 16 14   Temp:  97.7 F (36.5 C) 98.2 F (36.8 C) 98.4 F (36.9 C)  TempSrc:  Oral Oral   SpO2: 100% 100% 100% 96%  Weight:  70.2 kg    Height:  5\' 6"  (1.676 m)     Eyes: Anicteric no pallor. ENMT: No discharge from the ears eyes nose or mouth. Neck: No mass felt.  No neck rigidity. Respiratory: No rhonchi or crepitations. Cardiovascular: S1-S2 heard. Abdomen: Soft nontender bowel sound present. Musculoskeletal: Right foot ankle and right leg up to the right calf is swollen.  Pulses are felt.  Has good sensation. Skin: Mild erythema of the right foot and right leg. Neurologic: Alert awake oriented time place and person.  Moves all extremities. Psychiatric: Appears normal per normal affect.   Labs on Admission: I have personally reviewed following labs and imaging studies  CBC: Recent Labs  Lab 07/19/19 1933 07/20/19 0320  WBC 16.1* 11.2*  NEUTROABS 13.0*  --   HGB 14.4 13.8  HCT 43.4 40.3  MCV 93.3 92.2  PLT 354 313   Basic Metabolic Panel: Recent Labs  Lab 07/19/19 1933 07/20/19 0320  NA 139 138  K 3.9 3.6  CL 105 106  CO2 26 23  GLUCOSE 97 103*  BUN 8 8  CREATININE 0.78 0.70  CALCIUM 8.9 8.3*   GFR: Estimated Creatinine Clearance: 73.5 mL/min (by C-G formula based on SCr of 0.7 mg/dL). Liver Function Tests: Recent Labs  Lab 07/19/19 1933  AST 26  ALT 15  ALKPHOS 50  BILITOT 0.5  PROT 6.7  ALBUMIN 3.9   No results for input(s): LIPASE, AMYLASE in the  last 168 hours. No results for input(s): AMMONIA in the last 168 hours. Coagulation Profile: Recent Labs  Lab 07/19/19 1933  INR 1.1   Cardiac Enzymes: No results for input(s): CKTOTAL, CKMB, CKMBINDEX, TROPONINI in the last 168 hours. BNP (last 3 results) No results for input(s): PROBNP in the last 8760 hours. HbA1C: No results for input(s): HGBA1C in the last 72 hours. CBG: No results for input(s): GLUCAP in the last 168 hours. Lipid Profile: No results for input(s): CHOL, HDL, LDLCALC, TRIG, CHOLHDL, LDLDIRECT in the last 72 hours. Thyroid Function Tests: No results for input(s): TSH, T4TOTAL, FREET4, T3FREE, THYROIDAB in the last 72  hours. Anemia Panel: No results for input(s): VITAMINB12, FOLATE, FERRITIN, TIBC, IRON, RETICCTPCT in the last 72 hours. Urine analysis:    Component Value Date/Time   COLORURINE YELLOW 05/20/2019 0905   APPEARANCEUR CLEAR 05/20/2019 0905   LABSPEC 1.010 05/20/2019 0905   PHURINE 7.0 05/20/2019 0905   GLUCOSEU NEGATIVE 05/20/2019 0905   HGBUR TRACE-LYSED (A) 05/20/2019 0905   BILIRUBINUR NEGATIVE 05/20/2019 0905   KETONESUR NEGATIVE 05/20/2019 0905   PROTEINUR NEGATIVE 04/26/2019 1439   UROBILINOGEN 0.2 05/20/2019 0905   NITRITE NEGATIVE 05/20/2019 0905   LEUKOCYTESUR SMALL (A) 05/20/2019 0905   Sepsis Labs: @LABRCNTIP (procalcitonin:4,lacticidven:4) ) Recent Results (from the past 240 hour(s))  SARS Coronavirus 2 by RT PCR (hospital order, performed in [facility] hospital lab) Nasopharyngeal Nasopharyngeal Swab     Status: None   Collection Time: 07/19/19  7:45 PM   Specimen: Nasopharyngeal Swab  Result Value Ref Range Status   SARS Coronavirus 2 NEGATIVE NEGATIVE Final    Comment: (NOTE) SARS-CoV-2 target nucleic acids are NOT DETECTED.  The SARS-CoV-2 RNA is generally detectable in upper and lower respiratory specimens during the acute phase of infection. The lowest concentration of SARS-CoV-2 viral copies this assay can detect  is 250 copies / mL. A negative result does not preclude SARS-CoV-2 infection and should not be used as the sole basis for treatment or other patient management decisions.  A negative result may occur with improper specimen collection / handling, submission of specimen other than nasopharyngeal swab, presence of viral mutation(s) within the areas targeted by this assay, and inadequate number of viral copies (<250 copies / mL). A negative result must be combined with clinical observations, patient history, and epidemiological information.  Fact Sheet for Patients:   BoilerBrush.com.cyhttps://www.fda.gov/media/136312/download  Fact Sheet for Healthcare Providers: https://pope.com/https://www.fda.gov/media/136313/download  This test is not yet approved or  cleared by the [place] FDA and has been authorized for detection and/or diagnosis of SARS-CoV-2 by FDA under an Emergency Use Authorization (EUA).  This EUA will remain in effect (meaning this test can be used) for the duration of the COVID-19 declaration under Section 564(b)(1) of the Act, 21 U.S.C. section 360bbb-3(b)(1), unless the authorization is terminated or revoked sooner.  Performed at [facility], [address] [place], [place] [address]      Radiological Exams on Admission: No results found.    Assessment/Plan Principal Problem:   Snake envenomation Active Problems:   Attention deficit disorder   Snake bite    1. Snake envenomation -the type of snake is not known.  After discussing with poison control since patient swelling is progressing to the calf muscle at this time poison control advised to give 2 more vials of CroFab and this will total up to 6 vials now.  Also advised to closely monitor the size of the right leg which I have instructed the nurse on call for the patient.  As per the poison control instruction we will be mentioning the right leg every 4 hours.  Keep the right leg elevated.  Fibrinogen levels PT/INR and CBCs have  been stable. 2. History of ADD on Adderall patient states she takes 10 mg daily. 3. Leukocytosis likely reactionary. 4. History of migraine.   DVT prophylaxis: Patient is not on Lovenox or SCDs due to leg swelling and we are following the coagulation panel due to the snakebite. Code Status: Full code. Family Communication: Discussed with patient. Disposition Plan: Home when stable. Consults called: None. Admission status: Observation.   Meryle ReadyArshad N  Toniann Fail MD [facility] Pager [phone].  If 7PM-7AM, please contact night-coverage [email or web address] Password [email or web address]  07/20/2019, 6:01 AM

## 2019-07-22 ENCOUNTER — Telehealth: Payer: Self-pay | Admitting: *Deleted

## 2019-07-22 NOTE — Telephone Encounter (Signed)
Transition Care Management Follow-up Telephone Call   Date discharged? 07/20/19   How have you been since you were released from the hospital? Pt states she is doing alright   Do you understand why you were in the hospital? YES, she said a snake had bit her   Do you understand the discharge instructions? YES   Where were you discharged to? Home   Items Reviewed:  Medications reviewed: YES, she states she started the antibiotic (Augumentin)   Allergies reviewed: YES  Dietary changes reviewed: NO  Referrals reviewed: No referral needed   Functional Questionnaire:   Activities of Daily Living (ADLs):   She states she are independent in the following: ambulation, bathing and hygiene, feeding, continence, grooming, toileting and dressing States she doesn't require assistance   Any transportation issues/concerns?: NO   Any patient concerns? NO   Confirmed importance and date/time of follow-up visits scheduled YES, appt 07/26/19  Provider Appointment booked with Dr Posey Rea  Confirmed with patient if condition begins to worsen call PCP or go to the ER.  Patient was given the office number and encouraged to call back with question or concerns.  : YES

## 2019-07-26 ENCOUNTER — Encounter: Payer: Self-pay | Admitting: Internal Medicine

## 2019-07-26 ENCOUNTER — Ambulatory Visit: Payer: 59 | Admitting: Internal Medicine

## 2019-07-26 ENCOUNTER — Other Ambulatory Visit: Payer: Self-pay

## 2019-07-26 DIAGNOSIS — G43001 Migraine without aura, not intractable, with status migrainosus: Secondary | ICD-10-CM | POA: Diagnosis not present

## 2019-07-26 DIAGNOSIS — W5911XS Bitten by nonvenomous snake, sequela: Secondary | ICD-10-CM | POA: Diagnosis not present

## 2019-07-26 DIAGNOSIS — T63001A Toxic effect of unspecified snake venom, accidental (unintentional), initial encounter: Secondary | ICD-10-CM | POA: Diagnosis not present

## 2019-07-26 DIAGNOSIS — F900 Attention-deficit hyperactivity disorder, predominantly inattentive type: Secondary | ICD-10-CM | POA: Diagnosis not present

## 2019-07-26 MED ORDER — AMPHETAMINE-DEXTROAMPHETAMINE 20 MG PO TABS: 20.0000 mg | ORAL_TABLET | Freq: Two times a day (BID) | ORAL | 0 refills | Status: DC

## 2019-07-26 NOTE — Assessment & Plan Note (Signed)
On Adderall  Potential benefits of a long term amphetamines  use as well as potential risks  and complications were explained to the patient and were aknowledged. 

## 2019-07-26 NOTE — Assessment & Plan Note (Signed)
07/19/19 R foot - unknown snake R foot pain - recovering

## 2019-07-26 NOTE — Assessment & Plan Note (Signed)
07/19/19 R foot - unknown snake R foot pain - recovering 

## 2019-07-26 NOTE — Progress Notes (Signed)
Subjective:  Patient ID: Heather Freeman, female    DOB: 12/07/1965  Age: 54 y.o. MRN: [id number]  CC: No chief complaint on file.   HPI Heather Freeman presents for a snake bite to the R foot f/u. C/o residual leg pains...   "Admit date: 07/19/2019 Discharge date: 07/20/2019  Time spent: 35 minutes   Recommendations for Outpatient Follow-up:  1. Follow-up with your primary care provider within a week 2. Take your medications as prescribed 3. If skin breakdown develops please take Augmentin 1 tablet twice daily x7 days.  Discharge Diagnoses:      Active Hospital Problems   Diagnosis Date Noted  . Snake envenomation 07/19/2019  . Snake bite 07/19/2019  . Attention deficit disorder 11/18/2006  "   Outpatient Medications Prior to Visit  Medication Sig Dispense Refill  . amoxicillin-clavulanate (AUGMENTIN) 875-125 MG tablet Take 1 tablet by mouth 2 (two) times daily for 7 days. 14 tablet 0  . amphetamine-dextroamphetamine (ADDERALL) 20 MG tablet Take 1 tablet (20 mg total) by mouth 2 (two) times daily. AM and lunch (Patient taking differently: Take 20 mg by mouth See admin instructions. Take 20 mg by mouth one to two times a day) 60 tablet 0  . Cholecalciferol (VITAMIN D3) 1000 UNITS tablet Take 1,000 Units by mouth daily.      . cyclobenzaprine (FLEXERIL) 5 MG tablet Take 1-2 tablets (5-10 mg total) by mouth 3 (three) times daily as needed for muscle spasms. 100 tablet 1  . GLUCOSAMINE-CHONDROITIN PO Take 1 tablet by mouth in the morning and at bedtime.    Marland Kitchen loratadine (CLARITIN) 10 MG tablet Take 10 mg by mouth daily as needed for allergies.     . montelukast (SINGULAIR) 10 MG tablet TAKE 1 TABLET BY MOUTH EVERY DAY (Patient taking differently: Take 10 mg by mouth daily as needed (for seasonal allergies). ) 90 tablet 3  . PREMPRO 0.625-5 MG tablet TAKE 1 TABLET BY MOUTH EVERY DAY (Patient taking differently: Take 0.5-1 tablets by mouth daily. ) 28 tablet 5  . SUMAtriptan  Succinate (IMITREX PO) Take 1 tablet by mouth See admin instructions. Take 1 tablet by mouth at onset of migraine and may take one additional tablet two hours later, if no relief (max 2 tablets/day)     No facility-administered medications prior to visit.    ROS: Review of Systems  Constitutional: Negative for activity change, appetite change, chills, fatigue and unexpected weight change.  HENT: Negative for congestion, mouth sores and sinus pressure.   Eyes: Negative for visual disturbance.  Respiratory: Negative for cough and chest tightness.   Gastrointestinal: Negative for abdominal pain and nausea.  Genitourinary: Negative for difficulty urinating, frequency and vaginal pain.  Musculoskeletal: Negative for back pain and gait problem.  Skin: Negative for pallor and rash.  Neurological: Negative for dizziness, tremors, weakness, numbness and headaches.  Psychiatric/Behavioral: Negative for confusion and sleep disturbance.    Objective:  BP 138/82 (BP Location: Left Arm, Patient Position: Sitting, Cuff Size: Normal)   Pulse 73   Temp 97.8 F (36.6 C) (Oral)   Ht 5\' 6"  (1.676 m)   Wt 150 lb (68 kg)   LMP 06/13/2012   SpO2 98%   BMI 24.21 kg/m   BP Readings from Last 3 Encounters:  07/26/19 138/82  07/20/19 103/66  05/03/19 (!) 144/84    Wt Readings from Last 3 Encounters:  07/26/19 150 lb (68 kg)  07/19/19 154 lb 12.2 oz (70.2 kg)  05/03/19 149  lb (67.6 kg)    Physical Exam Constitutional:      General: She is not in acute distress.    Appearance: She is well-developed.  HENT:     Head: Normocephalic.     Right Ear: External ear normal.     Left Ear: External ear normal.     Nose: Nose normal.  Eyes:     General:        Right eye: No discharge.        Left eye: No discharge.     Conjunctiva/sclera: Conjunctivae normal.     Pupils: Pupils are equal, round, and reactive to light.  Neck:     Thyroid: No thyromegaly.     Vascular: No JVD.     Trachea: No  tracheal deviation.  Cardiovascular:     Rate and Rhythm: Normal rate and regular rhythm.     Heart sounds: Normal heart sounds.  Pulmonary:     Effort: No respiratory distress.     Breath sounds: No stridor. No wheezing.  Abdominal:     General: Bowel sounds are normal. There is no distension.     Palpations: Abdomen is soft. There is no mass.     Tenderness: There is no abdominal tenderness. There is no guarding or rebound.  Musculoskeletal:        General: No tenderness.     Cervical back: Normal range of motion and neck supple.  Lymphadenopathy:     Cervical: No cervical adenopathy.  Skin:    Findings: No erythema or rash.  Neurological:     Mental Status: She is oriented to person, place, and time.     Cranial Nerves: No cranial nerve deficit.     Motor: No abnormal muscle tone.     Coordination: Coordination normal.     Deep Tendon Reflexes: Reflexes normal.  Psychiatric:        Behavior: Behavior normal.        Thought Content: Thought content normal.        Judgment: Judgment normal.     Lab Results  Component Value Date   WBC 11.2 (H) 07/20/2019   HGB 13.8 07/20/2019   HCT 40.3 07/20/2019   PLT 313 07/20/2019   GLUCOSE 103 (H) 07/20/2019   CHOL 217 (H) 05/20/2019   TRIG 59.0 05/20/2019   HDL 67.00 05/20/2019   LDLDIRECT 101.3 11/09/2012   LDLCALC 138 (H) 05/20/2019   ALT 15 07/19/2019   AST 26 07/19/2019   NA 138 07/20/2019   K 3.6 07/20/2019   CL 106 07/20/2019   CREATININE 0.70 07/20/2019   BUN 8 07/20/2019   CO2 23 07/20/2019   TSH 1.46 05/20/2019   INR 1.1 07/19/2019    No results found.  Assessment & Plan:    Sonda Primes, MD

## 2019-07-26 NOTE — Assessment & Plan Note (Signed)
Imitrex prn 

## 2019-11-27 ENCOUNTER — Other Ambulatory Visit: Payer: Self-pay | Admitting: Internal Medicine

## 2020-02-22 ENCOUNTER — Encounter: Payer: Self-pay | Admitting: Internal Medicine

## 2020-02-22 ENCOUNTER — Other Ambulatory Visit: Payer: Self-pay

## 2020-02-22 ENCOUNTER — Ambulatory Visit: Payer: 59 | Admitting: Internal Medicine

## 2020-02-22 DIAGNOSIS — Z23 Encounter for immunization: Secondary | ICD-10-CM | POA: Diagnosis not present

## 2020-02-22 DIAGNOSIS — F900 Attention-deficit hyperactivity disorder, predominantly inattentive type: Secondary | ICD-10-CM | POA: Diagnosis not present

## 2020-02-22 DIAGNOSIS — N951 Menopausal and female climacteric states: Secondary | ICD-10-CM

## 2020-02-22 DIAGNOSIS — F411 Generalized anxiety disorder: Secondary | ICD-10-CM | POA: Diagnosis not present

## 2020-02-22 MED ORDER — AMPHETAMINE-DEXTROAMPHETAMINE 20 MG PO TABS: 20.0000 mg | ORAL_TABLET | Freq: Two times a day (BID) | ORAL | 0 refills | Status: DC

## 2020-02-22 MED ORDER — SUMATRIPTAN SUCCINATE 100 MG PO TABS: 100.0000 mg | ORAL_TABLET | ORAL | 3 refills | Status: DC

## 2020-02-22 NOTE — Progress Notes (Signed)
Subjective:  Patient ID: Heather Freeman, female    DOB: [birth year]  Age: 55 y.o. MRN: [id number]  CC: Follow-up (Requesting refills on Adderral)   HPI Heather Freeman presents for ADD  Outpatient Medications Prior to Visit  Medication Sig Dispense Refill  . amphetamine-dextroamphetamine (ADDERALL) 20 MG tablet Take 1 tablet (20 mg total) by mouth 2 (two) times daily. AM and lunch 60 tablet 0  . Cholecalciferol (VITAMIN D3) 1000 UNITS tablet Take 1,000 Units by mouth daily.    . cyclobenzaprine (FLEXERIL) 5 MG tablet Take 1-2 tablets (5-10 mg total) by mouth 3 (three) times daily as needed for muscle spasms. 100 tablet 1  . GLUCOSAMINE-CHONDROITIN PO Take 1 tablet by mouth in the morning and at bedtime.    Marland Kitchen loratadine (CLARITIN) 10 MG tablet Take 10 mg by mouth daily as needed for allergies.     . montelukast (SINGULAIR) 10 MG tablet TAKE 1 TABLET BY MOUTH EVERY DAY (Patient taking differently: Take 10 mg by mouth daily as needed (for seasonal allergies).) 90 tablet 3  . PREMPRO 0.625-5 MG tablet TAKE 1 TABLET BY MOUTH EVERY DAY 28 tablet 5  . SUMAtriptan Succinate (IMITREX PO) Take 1 tablet by mouth See admin instructions. Take 1 tablet by mouth at onset of migraine and may take one additional tablet two hours later, if no relief (max 2 tablets/day)    . amphetamine-dextroamphetamine (ADDERALL) 20 MG tablet Take 1 tablet (20 mg total) by mouth 2 (two) times daily. AM and lunch 60 tablet 0  . amphetamine-dextroamphetamine (ADDERALL) 20 MG tablet Take 1 tablet (20 mg total) by mouth 2 (two) times daily. AM and lunch. 60 tablet 0   No facility-administered medications prior to visit.    ROS: Review of Systems  Constitutional: Negative for activity change, appetite change, chills, fatigue and unexpected weight change.  HENT: Negative for congestion, mouth sores and sinus pressure.   Eyes: Negative for visual disturbance.  Respiratory: Negative for cough and chest tightness.    Gastrointestinal: Negative for abdominal pain and nausea.  Genitourinary: Negative for difficulty urinating, frequency and vaginal pain.  Musculoskeletal: Negative for back pain and gait problem.  Skin: Negative for pallor and rash.  Neurological: Negative for dizziness, tremors, weakness, numbness and headaches.  Psychiatric/Behavioral: Positive for decreased concentration. Negative for confusion and sleep disturbance. The patient is not nervous/anxious.     Objective:  BP 120/82 (BP Location: Left Arm)   Pulse 98   Temp 98.8 F (37.1 C) (Oral)   Ht 5\' 6"  (1.676 m)   Wt 159 lb 9.6 oz (72.4 kg)   LMP 06/13/2012   SpO2 97%   BMI 25.76 kg/m   BP Readings from Last 3 Encounters:  02/22/20 120/82  07/26/19 138/82  07/20/19 103/66    Wt Readings from Last 3 Encounters:  02/22/20 159 lb 9.6 oz (72.4 kg)  07/26/19 150 lb (68 kg)  07/19/19 154 lb 12.2 oz (70.2 kg)    Physical Exam Constitutional:      General: She is not in acute distress.    Appearance: She is well-developed.  HENT:     Head: Normocephalic.     Right Ear: External ear normal.     Left Ear: External ear normal.     Nose: Nose normal.     Mouth/Throat:     Mouth: Oropharynx is clear and moist.  Eyes:     General:        Right eye: No discharge.  Left eye: No discharge.     Conjunctiva/sclera: Conjunctivae normal.     Pupils: Pupils are equal, round, and reactive to light.  Neck:     Thyroid: No thyromegaly.     Vascular: No JVD.     Trachea: No tracheal deviation.  Cardiovascular:     Rate and Rhythm: Normal rate and regular rhythm.     Heart sounds: Normal heart sounds.  Pulmonary:     Effort: No respiratory distress.     Breath sounds: No stridor. No wheezing.  Abdominal:     General: Bowel sounds are normal. There is no distension.     Palpations: Abdomen is soft. There is no mass.     Tenderness: There is no abdominal tenderness. There is no guarding or rebound.  Musculoskeletal:         General: No tenderness or edema.     Cervical back: Normal range of motion and neck supple.  Lymphadenopathy:     Cervical: No cervical adenopathy.  Skin:    Findings: No erythema or rash.  Neurological:     Cranial Nerves: No cranial nerve deficit.     Motor: No abnormal muscle tone.     Coordination: Coordination normal.     Deep Tendon Reflexes: Reflexes normal.  Psychiatric:        Mood and Affect: Mood and affect normal.        Behavior: Behavior normal.        Thought Content: Thought content normal.        Judgment: Judgment normal.     Lab Results  Component Value Date   WBC 11.2 (H) 07/20/2019   HGB 13.8 07/20/2019   HCT 40.3 07/20/2019   PLT 313 07/20/2019   GLUCOSE 103 (H) 07/20/2019   CHOL 217 (H) 05/20/2019   TRIG 59.0 05/20/2019   HDL 67.00 05/20/2019   LDLDIRECT 101.3 11/09/2012   LDLCALC 138 (H) 05/20/2019   ALT 15 07/19/2019   AST 26 07/19/2019   NA 138 07/20/2019   K 3.6 07/20/2019   CL 106 07/20/2019   CREATININE 0.70 07/20/2019   BUN 8 07/20/2019   CO2 23 07/20/2019   TSH 1.46 05/20/2019   INR 1.1 07/19/2019    No results found.  Assessment & Plan:    Sonda Primes, MD

## 2020-02-22 NOTE — Assessment & Plan Note (Signed)
On Adderall  Potential benefits of a long term amphetamines  use as well as potential risks  and complications were explained to the patient and were aknowledged. 

## 2020-02-22 NOTE — Assessment & Plan Note (Addendum)
Overall better.  Continue with Prempro 1/2 a day for symptom control

## 2020-02-26 NOTE — Assessment & Plan Note (Signed)
Xanax prn rare use  Potential benefits of a long term benzodiazepines  use as well as potential risks  and complications were explained to the patient and were aknowledged.  

## 2020-09-05 ENCOUNTER — Other Ambulatory Visit: Payer: Self-pay

## 2020-09-05 ENCOUNTER — Ambulatory Visit: Payer: 59 | Admitting: Internal Medicine

## 2020-09-05 ENCOUNTER — Encounter: Payer: Self-pay | Admitting: Internal Medicine

## 2020-09-05 VITALS — BP 120/72 | Temp 98.1°F | Ht 66.0 in | Wt 152.6 lb

## 2020-09-05 DIAGNOSIS — F411 Generalized anxiety disorder: Secondary | ICD-10-CM

## 2020-09-05 DIAGNOSIS — N951 Menopausal and female climacteric states: Secondary | ICD-10-CM

## 2020-09-05 DIAGNOSIS — Z0000 Encounter for general adult medical examination without abnormal findings: Secondary | ICD-10-CM

## 2020-09-05 DIAGNOSIS — F900 Attention-deficit hyperactivity disorder, predominantly inattentive type: Secondary | ICD-10-CM | POA: Diagnosis not present

## 2020-09-05 DIAGNOSIS — G43001 Migraine without aura, not intractable, with status migrainosus: Secondary | ICD-10-CM | POA: Diagnosis not present

## 2020-09-05 MED ORDER — SUMATRIPTAN SUCCINATE 100 MG PO TABS: 100.0000 mg | ORAL_TABLET | ORAL | 3 refills | Status: DC

## 2020-09-05 MED ORDER — AMPHETAMINE-DEXTROAMPHETAMINE 20 MG PO TABS: 20.0000 mg | ORAL_TABLET | Freq: Two times a day (BID) | ORAL | 0 refills | Status: DC

## 2020-09-05 MED ORDER — TRIAMCINOLONE ACETONIDE 0.5 % EX CREA: 1.0000 "application " | TOPICAL_CREAM | Freq: Three times a day (TID) | CUTANEOUS | 2 refills | Status: AC

## 2020-09-05 NOTE — Assessment & Plan Note (Signed)
Xanax prn rare use  Potential benefits of a long term benzodiazepines  use as well as potential risks  and complications were explained to the patient and were aknowledged.  

## 2020-09-05 NOTE — Assessment & Plan Note (Signed)
On Adderall  Potential benefits of a long term amphetamines  use as well as potential risks  and complications were explained to the patient and were aknowledged. 

## 2020-09-05 NOTE — Assessment & Plan Note (Signed)
Pt stopped Prempro

## 2020-09-05 NOTE — Assessment & Plan Note (Signed)
Cont to use Imitrex prn

## 2020-09-05 NOTE — Progress Notes (Signed)
Subjective:  Patient ID: Heather Freeman, female    DOB: Jun 24, 1965  Age: 55 y.o. MRN: [id number]  CC: Medication Refill (Adderral, Imitrex and Triamcinolone cream)   HPI [name] presents for ADD, HAs, allergies  Outpatient Medications Prior to Visit  Medication Sig Dispense Refill   amphetamine-dextroamphetamine (ADDERALL) 20 MG tablet Take 1 tablet (20 mg total) by mouth 2 (two) times daily. AM and lunch. 60 tablet 0   Cholecalciferol (VITAMIN D3) 1000 UNITS tablet Take 1,000 Units by mouth daily.     cyclobenzaprine (FLEXERIL) 5 MG tablet Take 1-2 tablets (5-10 mg total) by mouth 3 (three) times daily as needed for muscle spasms. 100 tablet 1   GLUCOSAMINE-CHONDROITIN PO Take 1 tablet by mouth in the morning and at bedtime.     loratadine (CLARITIN) 10 MG tablet Take 10 mg by mouth daily as needed for allergies.      montelukast (SINGULAIR) 10 MG tablet TAKE 1 TABLET BY MOUTH EVERY DAY (Patient taking differently: Take 10 mg by mouth daily as needed (for seasonal allergies).) 90 tablet 3   PREMPRO 0.625-5 MG tablet TAKE 1 TABLET BY MOUTH EVERY DAY 28 tablet 5   SUMAtriptan (IMITREX) 100 MG tablet Take 1 tablet (100 mg total) by mouth See admin instructions. Take 1 tablet by mouth at onset of migraine and may take one additional tablet two hours later, if no relief (max 2 tablets/day) 12 tablet 3   amphetamine-dextroamphetamine (ADDERALL) 20 MG tablet Take 1 tablet (20 mg total) by mouth 2 (two) times daily. AM and lunch 60 tablet 0   amphetamine-dextroamphetamine (ADDERALL) 20 MG tablet Take 1 tablet (20 mg total) by mouth 2 (two) times daily. AM and lunch 60 tablet 0   No facility-administered medications prior to visit.    ROS: Review of Systems  Constitutional:  Negative for activity change, appetite change, chills, fatigue and unexpected weight change.  HENT:  Negative for congestion, mouth sores and sinus pressure.   Eyes:  Negative for visual disturbance.   Respiratory:  Negative for cough and chest tightness.   Gastrointestinal:  Negative for abdominal pain and nausea.  Genitourinary:  Negative for difficulty urinating, frequency and vaginal pain.  Musculoskeletal:  Negative for back pain and gait problem.  Skin:  Negative for pallor and rash.  Neurological:  Negative for dizziness, tremors, weakness, numbness and headaches.  Psychiatric/Behavioral:  Positive for decreased concentration. Negative for confusion, sleep disturbance and suicidal ideas. The patient is not nervous/anxious.    Objective:  BP 120/72 (BP Location: Left Arm)   Temp 98.1 F (36.7 C) (Oral)   Ht 5\' 6"  (1.676 m)   Wt 152 lb 9.6 oz (69.2 kg)   LMP 06/13/2012   SpO2 96%   BMI 24.63 kg/m   BP Readings from Last 3 Encounters:  09/05/20 120/72  02/22/20 120/82  07/26/19 138/82    Wt Readings from Last 3 Encounters:  09/05/20 152 lb 9.6 oz (69.2 kg)  02/22/20 159 lb 9.6 oz (72.4 kg)  07/26/19 150 lb (68 kg)    Physical Exam Constitutional:      General: She is not in acute distress.    Appearance: Normal appearance. She is well-developed.  HENT:     Head: Normocephalic.     Right Ear: External ear normal.     Left Ear: External ear normal.     Nose: Nose normal.  Eyes:     General:        Right eye: No  discharge.        Left eye: No discharge.     Conjunctiva/sclera: Conjunctivae normal.     Pupils: Pupils are equal, round, and reactive to light.  Neck:     Thyroid: No thyromegaly.     Vascular: No JVD.     Trachea: No tracheal deviation.  Cardiovascular:     Rate and Rhythm: Normal rate and regular rhythm.     Heart sounds: Normal heart sounds.  Pulmonary:     Effort: No respiratory distress.     Breath sounds: No stridor. No wheezing.  Abdominal:     General: Bowel sounds are normal. There is no distension.     Palpations: Abdomen is soft. There is no mass.     Tenderness: There is no abdominal tenderness. There is no guarding or rebound.   Musculoskeletal:        General: No tenderness.     Cervical back: Normal range of motion and neck supple. No rigidity.  Lymphadenopathy:     Cervical: No cervical adenopathy.  Skin:    Findings: No erythema or rash.  Neurological:     Mental Status: She is oriented to person, place, and time.     Cranial Nerves: No cranial nerve deficit.     Motor: No abnormal muscle tone.     Coordination: Coordination normal.     Deep Tendon Reflexes: Reflexes normal.  Psychiatric:        Behavior: Behavior normal.        Thought Content: Thought content normal.        Judgment: Judgment normal.    Lab Results  Component Value Date   WBC 11.2 (H) 07/20/2019   HGB 13.8 07/20/2019   HCT 40.3 07/20/2019   PLT 313 07/20/2019   GLUCOSE 103 (H) 07/20/2019   CHOL 217 (H) 05/20/2019   TRIG 59.0 05/20/2019   HDL 67.00 05/20/2019   LDLDIRECT 101.3 11/09/2012   LDLCALC 138 (H) 05/20/2019   ALT 15 07/19/2019   AST 26 07/19/2019   NA 138 07/20/2019   K 3.6 07/20/2019   CL 106 07/20/2019   CREATININE 0.70 07/20/2019   BUN 8 07/20/2019   CO2 23 07/20/2019   TSH 1.46 05/20/2019   INR 1.1 07/19/2019    No results found.  Assessment & Plan:     Sonda Primes, MD

## 2020-10-02 ENCOUNTER — Other Ambulatory Visit: Payer: Self-pay | Admitting: Internal Medicine

## 2020-10-02 DIAGNOSIS — Z1231 Encounter for screening mammogram for malignant neoplasm of breast: Secondary | ICD-10-CM

## 2020-11-01 ENCOUNTER — Other Ambulatory Visit: Payer: Self-pay

## 2020-11-01 ENCOUNTER — Other Ambulatory Visit (INDEPENDENT_AMBULATORY_CARE_PROVIDER_SITE_OTHER): Payer: 59

## 2020-11-01 DIAGNOSIS — Z0000 Encounter for general adult medical examination without abnormal findings: Secondary | ICD-10-CM

## 2020-11-01 LAB — LIPID PANEL
Cholesterol: 219 mg/dL — ABNORMAL HIGH (ref 0–200)
HDL: 69.6 mg/dL (ref >39.00)
LDL Cholesterol: 136 mg/dL — ABNORMAL HIGH (ref 0–99)
NonHDL: 149.02
Total CHOL/HDL Ratio: 3
Triglycerides: 66 mg/dL (ref 0.0–149.0)
VLDL: 13.2 mg/dL (ref 0.0–40.0)

## 2020-11-01 LAB — CBC WITH DIFFERENTIAL/PLATELET
Basophils Absolute: 0 10*3/uL (ref 0.0–0.1)
Basophils Relative: 0.6 % (ref 0.0–3.0)
Eosinophils Absolute: 0.3 10*3/uL (ref 0.0–0.7)
Eosinophils Relative: 3.3 % (ref 0.0–5.0)
HCT: 42.7 % (ref 36.0–46.0)
Hemoglobin: 14.6 g/dL (ref 12.0–15.0)
Lymphocytes Relative: 40.6 % (ref 12.0–46.0)
Lymphs Abs: 3.1 10*3/uL (ref 0.7–4.0)
MCHC: 34.2 g/dL (ref 30.0–36.0)
MCV: 92.9 fl (ref 78.0–100.0)
Monocytes Absolute: 0.4 10*3/uL (ref 0.1–1.0)
Monocytes Relative: 5.3 % (ref 3.0–12.0)
Neutro Abs: 3.9 10*3/uL (ref 1.4–7.7)
Neutrophils Relative %: 50.2 % (ref 43.0–77.0)
Platelets: 344 10*3/uL (ref 150.0–400.0)
RBC: 4.59 Mil/uL (ref 3.87–5.11)
RDW: 12.4 % (ref 11.5–15.5)
WBC: 7.7 10*3/uL (ref 4.0–10.5)

## 2020-11-01 LAB — COMPREHENSIVE METABOLIC PANEL
ALT: 19 U/L (ref 0–35)
AST: 27 U/L (ref 0–37)
Albumin: 4.4 g/dL (ref 3.5–5.2)
Alkaline Phosphatase: 58 U/L (ref 39–117)
BUN: 11 mg/dL (ref 6–23)
CO2: 28 mEq/L (ref 19–32)
Calcium: 9.3 mg/dL (ref 8.4–10.5)
Chloride: 105 mEq/L (ref 96–112)
Creatinine, Ser: 0.84 mg/dL (ref 0.40–1.20)
GFR: 77.2 mL/min (ref >60.00)
Glucose, Bld: 97 mg/dL (ref 70–99)
Potassium: 4.4 mEq/L (ref 3.5–5.1)
Sodium: 139 mEq/L (ref 135–145)
Total Bilirubin: 0.5 mg/dL (ref 0.2–1.2)
Total Protein: 7.3 g/dL (ref 6.0–8.3)

## 2020-11-01 LAB — URINALYSIS, ROUTINE W REFLEX MICROSCOPIC
Bilirubin Urine: NEGATIVE
Ketones, ur: NEGATIVE
Nitrite: NEGATIVE
Specific Gravity, Urine: 1.015 (ref 1.000–1.030)
Total Protein, Urine: NEGATIVE
Urine Glucose: NEGATIVE
Urobilinogen, UA: 0.2 (ref 0.0–1.0)
pH: 6.5 (ref 5.0–8.0)

## 2020-11-01 LAB — TSH: TSH: 3.14 u[IU]/mL (ref 0.35–5.50)

## 2020-11-02 ENCOUNTER — Ambulatory Visit
Admission: RE | Admit: 2020-11-02 | Discharge: 2020-11-02 | Disposition: A | Discharge status: Home / Self Care | Payer: 59 | Source: Ambulatory Visit | Attending: Internal Medicine | Admitting: Internal Medicine

## 2020-11-02 DIAGNOSIS — Z1231 Encounter for screening mammogram for malignant neoplasm of breast: Secondary | ICD-10-CM

## 2020-11-27 ENCOUNTER — Ambulatory Visit (INDEPENDENT_AMBULATORY_CARE_PROVIDER_SITE_OTHER): Payer: 59 | Admitting: Nurse Practitioner

## 2020-11-27 ENCOUNTER — Other Ambulatory Visit (HOSPITAL_COMMUNITY)
Admission: RE | Admit: 2020-11-27 | Discharge: 2020-11-27 | Disposition: A | Discharge status: Home / Self Care | Payer: 59 | Source: Ambulatory Visit | Attending: Nurse Practitioner | Admitting: Nurse Practitioner

## 2020-11-27 ENCOUNTER — Encounter: Payer: Self-pay | Admitting: Nurse Practitioner

## 2020-11-27 ENCOUNTER — Other Ambulatory Visit: Payer: Self-pay

## 2020-11-27 VITALS — BP 120/76 | Ht 65.0 in | Wt 153.0 lb

## 2020-11-27 DIAGNOSIS — Z01419 Encounter for gynecological examination (general) (routine) without abnormal findings: Secondary | ICD-10-CM

## 2020-11-27 DIAGNOSIS — Z780 Asymptomatic menopausal state: Secondary | ICD-10-CM

## 2020-11-27 NOTE — Progress Notes (Signed)
Heather Freeman Sep 30, 1965 [id number]    History: 55 year old MWF G2P2 presents for annual. Postmenopausal - stopped HRT over the summer and tolerating well, no bleeding. 2011 LGSIL, subsequent paps normal.   Gynecologic History Patient's last menstrual period was 06/13/2012.   Contraception/Family planning: post menopausal status Sexually active: Yes  Health Maintenance Last Pap: 08/22/2015. Results were: Normal Last mammogram: 11/02/2020. Results were: Normal Last colonoscopy: Never. Negative Cologuard 2020 Last Dexa: 2006. Results were: Normal  Past medical history, past surgical history, family history and social history were all reviewed and documented in the EPIC chart. Married. 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 Lost son in car accident in 2016, sister passed in 2019. 48 yo son, loves hiking.   ROS:  A ROS was performed and pertinent positives and negatives are included.  Exam:  Vitals:   11/27/20 1356  BP: 120/76  Weight: 153 lb (69.4 kg)  Height: 5\' 5"  (1.651 m)    Body mass index is 25.46 kg/m.   General appearance:  Normal Thyroid:  Symmetrical, normal in size, without palpable masses or nodularity. Respiratory  Auscultation:  Clear without wheezing or rhonchi Cardiovascular  Auscultation:  Regular rate, without rubs, murmurs or gallops  Edema/varicosities:  Not grossly evident Abdominal  Soft,nontender, without masses, guarding or rebound.  Liver/spleen:  No organomegaly noted  Hernia:  None appreciated  Skin  Inspection:  Grossly normal   Breasts: Examined lying and sitting.   Right: Without masses, retractions, discharge or axillary adenopathy.   Left: Without masses, retractions, discharge or axillary adenopathy. Genitourinary   Inguinal/mons:  Normal without inguinal adenopathy  External genitalia:  Normal appearing vulva with no masses, tenderness, or lesions  BUS/Urethra/Skene's glands:  Normal  Vagina:  Normal appearing with normal color and discharge, no  lesions  Cervix:  Normal appearing without discharge or lesions  Uterus:  Normal in size, shape and contour.  Midline and mobile, nontender  Adnexa/parametria:     Rt: Normal in size, without masses or tenderness.   Lt: Normal in size, without masses or tenderness.  Anus and perineum: Normal  Digital rectal exam: Normal sphincter tone without palpated masses or tenderness  Patient informed chaperone available to be present for breast and pelvic exam. Patient has requested no chaperone to be present. Patient has been advised what will be completed during breast and pelvic exam.   Assessment/Plan:  55 y.o. MWF G2P2 for annual   Well female exam with routine gynecological exam - Plan: Cytology - PAP( [place]). Education provided on SBEs, importance of preventative screenings, current guidelines, high calcium diet, regular exercise, and multivitamin daily. Labs with PCP.   Postmenopausal - stopped HRT this year and doing well. No bleeding.   Screening for cervical cancer - 2011 LGSIL, subsequent paps normal. Pap today.   Screening for breast cancer - Normal mammogram history.  Continue annual screenings.  Normal breast exam today.  Screening for colon cancer - 2020 negative Cologuard - managed by PCP.   Screening for osteoporosis - average risk. Will plan for DXA at age 23.   Return in 1 year for annual.   [address] [facility], 2:13 PM 11/27/2020

## 2020-12-03 LAB — CYTOLOGY - PAP
Comment: NEGATIVE
Diagnosis: NEGATIVE
High risk HPV: NEGATIVE

## 2021-01-30 ENCOUNTER — Encounter: Payer: Self-pay | Admitting: Internal Medicine

## 2021-01-30 ENCOUNTER — Other Ambulatory Visit: Payer: Self-pay

## 2021-01-30 ENCOUNTER — Ambulatory Visit: Payer: 59 | Admitting: Internal Medicine

## 2021-01-30 VITALS — BP 130/68 | HR 71 | Temp 97.9°F | Ht 65.0 in | Wt 154.0 lb

## 2021-01-30 DIAGNOSIS — Z23 Encounter for immunization: Secondary | ICD-10-CM | POA: Diagnosis not present

## 2021-01-30 DIAGNOSIS — G43001 Migraine without aura, not intractable, with status migrainosus: Secondary | ICD-10-CM | POA: Diagnosis not present

## 2021-01-30 DIAGNOSIS — Z0000 Encounter for general adult medical examination without abnormal findings: Secondary | ICD-10-CM

## 2021-01-30 DIAGNOSIS — F900 Attention-deficit hyperactivity disorder, predominantly inattentive type: Secondary | ICD-10-CM | POA: Diagnosis not present

## 2021-01-30 DIAGNOSIS — F411 Generalized anxiety disorder: Secondary | ICD-10-CM

## 2021-01-30 MED ORDER — AMPHETAMINE-DEXTROAMPHETAMINE 20 MG PO TABS: 20.0000 mg | ORAL_TABLET | Freq: Two times a day (BID) | ORAL | 0 refills | Status: DC

## 2021-01-30 NOTE — Assessment & Plan Note (Signed)
Xanax prn rare use  Potential benefits of a long term benzodiazepines  use as well as potential risks  and complications were explained to the patient and were aknowledged.  

## 2021-01-30 NOTE — Progress Notes (Signed)
Subjective:  Patient ID: Heather Freeman, female    DOB: 02/09/1965  Age: 56 y.o. MRN: [id number]  CC: Medication Refill   HPI Eliberto IvoryKathryn Joy Schlotter presents for ADD, anxiety, HAs One dog died, one 58 yo dog is ok  Outpatient Medications Prior to Visit  Medication Sig Dispense Refill   Cholecalciferol (VITAMIN D3) 1000 UNITS tablet Take 1,000 Units by mouth daily.     GLUCOSAMINE-CHONDROITIN PO Take 1 tablet by mouth in the morning and at bedtime.     loratadine (CLARITIN) 10 MG tablet Take 10 mg by mouth daily as needed for allergies.      montelukast (SINGULAIR) 10 MG tablet TAKE 1 TABLET BY MOUTH EVERY DAY (Patient taking differently: Take 10 mg by mouth daily as needed (for seasonal allergies).) 90 tablet 3   SUMAtriptan (IMITREX) 100 MG tablet Take 1 tablet (100 mg total) by mouth See admin instructions. Take 1 tablet by mouth at onset of migraine and may take one additional tablet two hours later, if no relief (max 2 tablets/day) 12 tablet 3   triamcinolone 0.5%-Eucerin equivalent 1:1 cream mixture Apply topically 2 (two) times daily as needed.     triamcinolone cream (KENALOG) 0.5 % Apply 1 application topically 3 (three) times daily. 120 g 2   amphetamine-dextroamphetamine (ADDERALL) 20 MG tablet Take 1 tablet (20 mg total) by mouth 2 (two) times daily. AM and lunch. 60 tablet 0   amphetamine-dextroamphetamine (ADDERALL) 20 MG tablet Take 1 tablet (20 mg total) by mouth 2 (two) times daily. AM and lunch 60 tablet 0   amphetamine-dextroamphetamine (ADDERALL) 20 MG tablet Take 1 tablet (20 mg total) by mouth 2 (two) times daily. AM and lunch 60 tablet 0   No facility-administered medications prior to visit.    ROS: Review of Systems  Constitutional:  Negative for activity change, appetite change, chills, fatigue and unexpected weight change.  HENT:  Negative for congestion, mouth sores and sinus pressure.   Eyes:  Negative for visual disturbance.  Respiratory:  Negative for  cough and chest tightness.   Gastrointestinal:  Negative for abdominal pain and nausea.  Genitourinary:  Negative for difficulty urinating, frequency and vaginal pain.  Musculoskeletal:  Negative for back pain and gait problem.  Skin:  Negative for pallor and rash.  Neurological:  Negative for dizziness, tremors, weakness, numbness and headaches.  Psychiatric/Behavioral:  Negative for confusion and sleep disturbance.    Objective:  BP 130/68 (BP Location: Left Arm, Patient Position: Sitting, Cuff Size: Normal)    Pulse 71    Temp 97.9 F (36.6 C) (Oral)    Ht 5\' 5"  (1.651 m)    Wt 154 lb (69.9 kg)    LMP 06/13/2012    SpO2 98%    BMI 25.63 kg/m   BP Readings from Last 3 Encounters:  01/30/21 130/68  11/27/20 120/76  09/05/20 120/72    Wt Readings from Last 3 Encounters:  01/30/21 154 lb (69.9 kg)  11/27/20 153 lb (69.4 kg)  09/05/20 152 lb 9.6 oz (69.2 kg)    Physical Exam Constitutional:      General: She is not in acute distress.    Appearance: She is well-developed.  HENT:     Head: Normocephalic.     Right Ear: External ear normal.     Left Ear: External ear normal.     Nose: Nose normal.  Eyes:     General:        Right eye: No discharge.  Left eye: No discharge.     Conjunctiva/sclera: Conjunctivae normal.     Pupils: Pupils are equal, round, and reactive to light.  Neck:     Thyroid: No thyromegaly.     Vascular: No JVD.     Trachea: No tracheal deviation.  Cardiovascular:     Rate and Rhythm: Normal rate and regular rhythm.     Heart sounds: Normal heart sounds.  Pulmonary:     Effort: No respiratory distress.     Breath sounds: No stridor. No wheezing.  Abdominal:     General: Bowel sounds are normal. There is no distension.     Palpations: Abdomen is soft. There is no mass.     Tenderness: There is no abdominal tenderness. There is no guarding or rebound.  Musculoskeletal:        General: No tenderness.     Cervical back: Normal range of motion  and neck supple. No rigidity.  Lymphadenopathy:     Cervical: No cervical adenopathy.  Skin:    Findings: No erythema or rash.  Neurological:     Mental Status: She is oriented to person, place, and time.     Cranial Nerves: No cranial nerve deficit.     Motor: No abnormal muscle tone.     Coordination: Coordination normal.     Deep Tendon Reflexes: Reflexes normal.  Psychiatric:        Behavior: Behavior normal.        Thought Content: Thought content normal.        Judgment: Judgment normal.    Lab Results  Component Value Date   WBC 7.7 11/01/2020   HGB 14.6 11/01/2020   HCT 42.7 11/01/2020   PLT 344.0 11/01/2020   GLUCOSE 97 11/01/2020   CHOL 219 (H) 11/01/2020   TRIG 66.0 11/01/2020   HDL 69.60 11/01/2020   LDLDIRECT 101.3 11/09/2012   LDLCALC 136 (H) 11/01/2020   ALT 19 11/01/2020   AST 27 11/01/2020   NA 139 11/01/2020   K 4.4 11/01/2020   CL 105 11/01/2020   CREATININE 0.84 11/01/2020   BUN 11 11/01/2020   CO2 28 11/01/2020   TSH 3.14 11/01/2020   INR 1.1 07/19/2019    No results found.  Assessment & Plan:   Problem List Items Addressed This Visit     Attention deficit disorder    On Adderall  Potential benefits of a long term amphetamines use as well as potential risks  and complications were explained to the patient and were aknowledged.      Generalized anxiety disorder    Xanax prn-rare use  Potential benefits of a long term benzodiazepines  use as well as potential risks  and complications were explained to the patient and were aknowledged.      Migraine headache    Cont to use Imitrex prn      Other Visit Diagnoses     Need for immunization against influenza    -  Primary   Relevant Orders   Flu Vaccine QUAD 55mo+IM (Fluarix, Fluzone & Alfiuria Quad PF) (Completed)         Meds ordered this encounter  Medications   amphetamine-dextroamphetamine (ADDERALL) 20 MG tablet    Sig: Take 1 tablet (20 mg total) by mouth 2 (two) times  daily. AM and lunch.    Dispense:  60 tablet    Refill:  0    Please fill on or after 01/30/21   amphetamine-dextroamphetamine (ADDERALL) 20 MG tablet  Sig: Take 1 tablet (20 mg total) by mouth 2 (two) times daily. AM and lunch    Dispense:  60 tablet    Refill:  0    Please fill on or after 03/01/21   amphetamine-dextroamphetamine (ADDERALL) 20 MG tablet    Sig: Take 1 tablet (20 mg total) by mouth 2 (two) times daily. AM and lunch    Dispense:  60 tablet    Refill:  0    Please fill on or after 03/31/21      Follow-up: Return in about 3 months (around 04/30/2021) for a follow-up visit.  Sonda Primes, MD

## 2021-01-30 NOTE — Assessment & Plan Note (Signed)
Cont to use Imitrex prn 

## 2021-01-30 NOTE — Assessment & Plan Note (Signed)
On Adderall  Potential benefits of a long term amphetamines  use as well as potential risks  and complications were explained to the patient and were aknowledged. 

## 2021-03-15 ENCOUNTER — Encounter: Payer: Self-pay | Admitting: Internal Medicine

## 2021-04-03 LAB — COLOGUARD: COLOGUARD: NEGATIVE

## 2021-07-25 ENCOUNTER — Other Ambulatory Visit: Payer: Self-pay | Admitting: Internal Medicine

## 2021-07-26 NOTE — Telephone Encounter (Signed)
Check [place] registry last filled 05/14/2021.Marland KitchenRaechel Freeman

## 2021-07-31 MED ORDER — AMPHETAMINE-DEXTROAMPHETAMINE 20 MG PO TABS: 20.0000 mg | ORAL_TABLET | Freq: Two times a day (BID) | ORAL | 0 refills | Status: DC

## 2021-09-04 ENCOUNTER — Other Ambulatory Visit (INDEPENDENT_AMBULATORY_CARE_PROVIDER_SITE_OTHER): Payer: 59

## 2021-09-04 DIAGNOSIS — Z0000 Encounter for general adult medical examination without abnormal findings: Secondary | ICD-10-CM

## 2021-09-04 LAB — LIPID PANEL
Cholesterol: 200 mg/dL (ref 0–200)
HDL: 54.9 mg/dL (ref >39.00)
LDL Cholesterol: 128 mg/dL — ABNORMAL HIGH (ref 0–99)
NonHDL: 145.24
Total CHOL/HDL Ratio: 4
Triglycerides: 87 mg/dL (ref 0.0–149.0)
VLDL: 17.4 mg/dL (ref 0.0–40.0)

## 2021-09-04 LAB — COMPREHENSIVE METABOLIC PANEL
ALT: 19 U/L (ref 0–35)
AST: 28 U/L (ref 0–37)
Albumin: 4.2 g/dL (ref 3.5–5.2)
Alkaline Phosphatase: 58 U/L (ref 39–117)
BUN: 13 mg/dL (ref 6–23)
CO2: 28 mEq/L (ref 19–32)
Calcium: 9.4 mg/dL (ref 8.4–10.5)
Chloride: 104 mEq/L (ref 96–112)
Creatinine, Ser: 0.79 mg/dL (ref 0.40–1.20)
GFR: 82.61 mL/min (ref >60.00)
Glucose, Bld: 110 mg/dL — ABNORMAL HIGH (ref 70–99)
Potassium: 3.5 mEq/L (ref 3.5–5.1)
Sodium: 138 mEq/L (ref 135–145)
Total Bilirubin: 0.6 mg/dL (ref 0.2–1.2)
Total Protein: 7.1 g/dL (ref 6.0–8.3)

## 2021-09-04 LAB — URINALYSIS, ROUTINE W REFLEX MICROSCOPIC
Bilirubin Urine: NEGATIVE
Ketones, ur: NEGATIVE
Nitrite: NEGATIVE
Specific Gravity, Urine: <=1.005 — AB (ref 1.000–1.030)
Total Protein, Urine: NEGATIVE
Urine Glucose: NEGATIVE
Urobilinogen, UA: 0.2 (ref 0.0–1.0)
pH: 7 (ref 5.0–8.0)

## 2021-09-04 LAB — CBC WITH DIFFERENTIAL/PLATELET
Basophils Absolute: 0 10*3/uL (ref 0.0–0.1)
Basophils Relative: 0.5 % (ref 0.0–3.0)
Eosinophils Absolute: 0.1 10*3/uL (ref 0.0–0.7)
Eosinophils Relative: 2 % (ref 0.0–5.0)
HCT: 40.1 % (ref 36.0–46.0)
Hemoglobin: 13.7 g/dL (ref 12.0–15.0)
Lymphocytes Relative: 37.8 % (ref 12.0–46.0)
Lymphs Abs: 2.6 10*3/uL (ref 0.7–4.0)
MCHC: 34.2 g/dL (ref 30.0–36.0)
MCV: 93 fl (ref 78.0–100.0)
Monocytes Absolute: 0.3 10*3/uL (ref 0.1–1.0)
Monocytes Relative: 4.5 % (ref 3.0–12.0)
Neutro Abs: 3.8 10*3/uL (ref 1.4–7.7)
Neutrophils Relative %: 55.2 % (ref 43.0–77.0)
Platelets: 347 10*3/uL (ref 150.0–400.0)
RBC: 4.31 Mil/uL (ref 3.87–5.11)
RDW: 12.7 % (ref 11.5–15.5)
WBC: 6.9 10*3/uL (ref 4.0–10.5)

## 2021-09-04 LAB — TSH: TSH: 1.47 u[IU]/mL (ref 0.35–5.50)

## 2021-09-11 ENCOUNTER — Encounter: Payer: Self-pay | Admitting: Internal Medicine

## 2021-09-11 ENCOUNTER — Ambulatory Visit (INDEPENDENT_AMBULATORY_CARE_PROVIDER_SITE_OTHER): Payer: 59 | Admitting: Internal Medicine

## 2021-09-11 DIAGNOSIS — F900 Attention-deficit hyperactivity disorder, predominantly inattentive type: Secondary | ICD-10-CM | POA: Diagnosis not present

## 2021-09-11 DIAGNOSIS — Z0000 Encounter for general adult medical examination without abnormal findings: Secondary | ICD-10-CM

## 2021-09-11 DIAGNOSIS — T148XXA Other injury of unspecified body region, initial encounter: Secondary | ICD-10-CM

## 2021-09-11 MED ORDER — AMPHETAMINE-DEXTROAMPHETAMINE 20 MG PO TABS
20.0000 mg | ORAL_TABLET | Freq: Two times a day (BID) | ORAL | 0 refills | Status: DC
Start: 2021-09-11 — End: 2021-11-16

## 2021-09-11 MED ORDER — AMPHETAMINE-DEXTROAMPHETAMINE 20 MG PO TABS: 20.0000 mg | ORAL_TABLET | Freq: Two times a day (BID) | ORAL | 0 refills | Status: DC

## 2021-09-11 NOTE — Assessment & Plan Note (Addendum)
We discussed age appropriate health related issues, including available/recomended screening tests and vaccinations. We discussed a need for adhering to healthy diet and exercise. Labs/EKG were reviewed/ordered. All questions were answered. Cologuard 2023

## 2021-09-11 NOTE — Patient Instructions (Signed)
Arnica cream for bruising °

## 2021-09-11 NOTE — Progress Notes (Signed)
Subjective:  Patient ID: Heather Freeman, female    DOB: [birth year]  Age: 56 y.o. MRN: [id number]  CC: Annual Exam   HPI Heather Freeman presents for a well exam  Outpatient Medications Prior to Visit  Medication Sig Dispense Refill   Cholecalciferol (VITAMIN D3) 1000 UNITS tablet Take 1,000 Units by mouth daily.     GLUCOSAMINE-CHONDROITIN PO Take 1 tablet by mouth in the morning and at bedtime.     loratadine (CLARITIN) 10 MG tablet Take 10 mg by mouth daily as needed for allergies.      montelukast (SINGULAIR) 10 MG tablet TAKE 1 TABLET BY MOUTH EVERY DAY (Patient taking differently: Take 10 mg by mouth daily as needed (for seasonal allergies).) 90 tablet 3   SUMAtriptan (IMITREX) 100 MG tablet Take 1 tablet (100 mg total) by mouth See admin instructions. Take 1 tablet by mouth at onset of migraine and may take one additional tablet two hours later, if no relief (max 2 tablets/day) 12 tablet 3   triamcinolone 0.5%-Eucerin equivalent 1:1 cream mixture Apply topically 2 (two) times daily as needed.     amphetamine-dextroamphetamine (ADDERALL) 20 MG tablet Take 1 tablet (20 mg total) by mouth 2 (two) times daily. AM and lunch 60 tablet 0   amphetamine-dextroamphetamine (ADDERALL) 20 MG tablet Take 1 tablet (20 mg total) by mouth 2 (two) times daily. AM and lunch 60 tablet 0   amphetamine-dextroamphetamine (ADDERALL) 20 MG tablet Take 1 tablet (20 mg total) by mouth 2 (two) times daily. AM and lunch. 60 tablet 0   No facility-administered medications prior to visit.    ROS: Review of Systems  Constitutional:  Negative for activity change, appetite change, chills, fatigue and unexpected weight change.  HENT:  Negative for congestion, mouth sores and sinus pressure.   Eyes:  Negative for visual disturbance.  Respiratory:  Negative for cough and chest tightness.   Gastrointestinal:  Negative for abdominal pain and nausea.  Genitourinary:  Negative for difficulty urinating,  frequency and vaginal pain.  Musculoskeletal:  Negative for back pain and gait problem.  Skin:  Negative for pallor and rash.  Neurological:  Negative for dizziness, tremors, weakness, numbness and headaches.  Hematological:  Bruises/bleeds easily.  Psychiatric/Behavioral:  Negative for confusion and sleep disturbance.     Objective:  BP 122/70 (BP Location: Left Arm)   Pulse 82   Temp 98.5 F (36.9 C) (Oral)   Ht 5\' 5"  (1.651 m)   Wt 151 lb 3.2 oz (68.6 kg)   LMP 06/13/2012   SpO2 97%   BMI 25.16 kg/m   BP Readings from Last 3 Encounters:  09/11/21 122/70  01/30/21 130/68  11/27/20 120/76    Wt Readings from Last 3 Encounters:  09/11/21 151 lb 3.2 oz (68.6 kg)  01/30/21 154 lb (69.9 kg)  11/27/20 153 lb (69.4 kg)    Physical Exam Constitutional:      General: She is not in acute distress.    Appearance: Normal appearance. She is well-developed.  HENT:     Head: Normocephalic.     Right Ear: External ear normal.     Left Ear: External ear normal.     Nose: Nose normal.  Eyes:     General:        Right eye: No discharge.        Left eye: No discharge.     Conjunctiva/sclera: Conjunctivae normal.     Pupils: Pupils are equal, round, and reactive to light.  Neck:     Thyroid: No thyromegaly.     Vascular: No JVD.     Trachea: No tracheal deviation.  Cardiovascular:     Rate and Rhythm: Normal rate and regular rhythm.     Heart sounds: Normal heart sounds.  Pulmonary:     Effort: No respiratory distress.     Breath sounds: No stridor. No wheezing.  Abdominal:     General: Bowel sounds are normal. There is no distension.     Palpations: Abdomen is soft. There is no mass.     Tenderness: There is no abdominal tenderness. There is no guarding or rebound.  Musculoskeletal:        General: No tenderness.     Cervical back: Normal range of motion and neck supple. No rigidity.     Right lower leg: No edema.     Left lower leg: No edema.  Lymphadenopathy:      Cervical: No cervical adenopathy.  Skin:    Findings: Bruising present. No erythema or rash.  Neurological:     Mental Status: She is oriented to person, place, and time.     Cranial Nerves: No cranial nerve deficit.     Motor: No weakness or abnormal muscle tone.     Coordination: Coordination normal.     Deep Tendon Reflexes: Reflexes normal.  Psychiatric:        Behavior: Behavior normal.        Thought Content: Thought content normal.        Judgment: Judgment normal.     Lab Results  Component Value Date   WBC 6.9 09/04/2021   HGB 13.7 09/04/2021   HCT 40.1 09/04/2021   PLT 347.0 09/04/2021   GLUCOSE 110 (H) 09/04/2021   CHOL 200 09/04/2021   TRIG 87.0 09/04/2021   HDL 54.90 09/04/2021   LDLDIRECT 101.3 11/09/2012   LDLCALC 128 (H) 09/04/2021   ALT 19 09/04/2021   AST 28 09/04/2021   NA 138 09/04/2021   K 3.5 09/04/2021   CL 104 09/04/2021   CREATININE 0.79 09/04/2021   BUN 13 09/04/2021   CO2 28 09/04/2021   TSH 1.47 09/04/2021   INR 1.1 07/19/2019    No results found.  Assessment & Plan:   Problem List Items Addressed This Visit     Bruising    Arnica cream for bruising (dog Cora)      Well adult exam    We discussed age appropriate health related issues, including available/recomended screening tests and vaccinations. We discussed a need for adhering to healthy diet and exercise. Labs/EKG were reviewed/ordered. All questions were answered. Cologuard 2023         Meds ordered this encounter  Medications   amphetamine-dextroamphetamine (ADDERALL) 20 MG tablet    Sig: Take 1 tablet (20 mg total) by mouth 2 (two) times daily. AM and lunch    Dispense:  60 tablet    Refill:  0    Please fill on or after 11/10/21   amphetamine-dextroamphetamine (ADDERALL) 20 MG tablet    Sig: Take 1 tablet (20 mg total) by mouth 2 (two) times daily. AM and lunch    Dispense:  60 tablet    Refill:  0    Please fill on or after 10/11/21    amphetamine-dextroamphetamine (ADDERALL) 20 MG tablet    Sig: Take 1 tablet (20 mg total) by mouth 2 (two) times daily. AM and lunch.    Dispense:  60 tablet    Refill:  0    Please fill on or after 09/11/21      Follow-up: No follow-ups on file.  Sonda Primes, MD

## 2021-09-11 NOTE — Assessment & Plan Note (Addendum)
Arnica cream for bruising (dog Cora)

## 2021-09-11 NOTE — Assessment & Plan Note (Signed)
On Adderall  Potential benefits of a long term amphetamines  use as well as potential risks  and complications were explained to the patient and were aknowledged. 

## 2021-11-15 ENCOUNTER — Other Ambulatory Visit: Payer: Self-pay | Admitting: Internal Medicine

## 2021-11-26 ENCOUNTER — Other Ambulatory Visit: Payer: Self-pay | Admitting: Internal Medicine

## 2021-11-26 DIAGNOSIS — Z1231 Encounter for screening mammogram for malignant neoplasm of breast: Secondary | ICD-10-CM

## 2021-12-25 ENCOUNTER — Ambulatory Visit
Admission: RE | Admit: 2021-12-25 | Discharge: 2021-12-25 | Disposition: A | Discharge status: Home / Self Care | Payer: 59 | Source: Ambulatory Visit

## 2021-12-25 DIAGNOSIS — Z1231 Encounter for screening mammogram for malignant neoplasm of breast: Secondary | ICD-10-CM

## 2022-01-22 ENCOUNTER — Ambulatory Visit: Payer: 59 | Admitting: Internal Medicine

## 2022-01-22 ENCOUNTER — Encounter: Payer: Self-pay | Admitting: Internal Medicine

## 2022-01-22 VITALS — BP 110/68 | HR 63 | Temp 99.0°F | Ht 65.0 in | Wt 152.4 lb

## 2022-01-22 DIAGNOSIS — F900 Attention-deficit hyperactivity disorder, predominantly inattentive type: Secondary | ICD-10-CM | POA: Diagnosis not present

## 2022-01-22 DIAGNOSIS — G43001 Migraine without aura, not intractable, with status migrainosus: Secondary | ICD-10-CM | POA: Diagnosis not present

## 2022-01-22 MED ORDER — AMPHETAMINE-DEXTROAMPHETAMINE 20 MG PO TABS: 20.0000 mg | ORAL_TABLET | Freq: Two times a day (BID) | ORAL | 0 refills | Status: DC

## 2022-01-22 MED ORDER — AMPHETAMINE-DEXTROAMPHETAMINE 20 MG PO TABS: ORAL_TABLET | ORAL | 0 refills | Status: DC

## 2022-01-22 NOTE — Assessment & Plan Note (Signed)
On Adderall  Potential benefits of a long term amphetamines  use as well as potential risks  and complications were explained to the patient and were aknowledged. 

## 2022-01-22 NOTE — Progress Notes (Signed)
Subjective:  Patient ID: Heather Freeman, female    DOB: [birth year]  Age: 57 y.o. MRN: [id number]  CC: No chief complaint on file.   HPI Heather Freeman presents for ADD, HAs  Outpatient Medications Prior to Visit  Medication Sig Dispense Refill   Cholecalciferol (VITAMIN D3) 1000 UNITS tablet Take 1,000 Units by mouth daily.     GLUCOSAMINE-CHONDROITIN PO Take 1 tablet by mouth in the morning and at bedtime.     loratadine (CLARITIN) 10 MG tablet Take 10 mg by mouth daily as needed for allergies.      montelukast (SINGULAIR) 10 MG tablet TAKE 1 TABLET BY MOUTH EVERY DAY (Patient taking differently: Take 10 mg by mouth daily as needed (for seasonal allergies).) 90 tablet 3   SUMAtriptan (IMITREX) 100 MG tablet Take 1 tablet (100 mg total) by mouth See admin instructions. Take 1 tablet by mouth at onset of migraine and may take one additional tablet two hours later, if no relief (max 2 tablets/day) 12 tablet 3   triamcinolone 0.5%-Eucerin equivalent 1:1 cream mixture Apply topically 2 (two) times daily as needed.     amphetamine-dextroamphetamine (ADDERALL) 20 MG tablet Take 1 tablet (20 mg total) by mouth 2 (two) times daily. AM and lunch 60 tablet 0   amphetamine-dextroamphetamine (ADDERALL) 20 MG tablet Take 1 tablet (20 mg total) by mouth 2 (two) times daily. AM and lunch (Patient not taking: Reported on 01/22/2022) 60 tablet 0   amphetamine-dextroamphetamine (ADDERALL) 20 MG tablet TAKE 1 TABLET BY MOUTH TWICE DAILY. 1 IN THE MORNING AND 1 AT LUNCH (Patient not taking: Reported on 01/22/2022) 60 tablet 0   No facility-administered medications prior to visit.    ROS: Review of Systems  Constitutional:  Negative for activity change, appetite change, chills, fatigue and unexpected weight change.  HENT:  Negative for congestion, mouth sores and sinus pressure.   Eyes:  Negative for visual disturbance.  Respiratory:  Negative for cough and chest tightness.   Gastrointestinal:   Negative for abdominal pain and nausea.  Genitourinary:  Negative for difficulty urinating, frequency and vaginal pain.  Musculoskeletal:  Negative for back pain and gait problem.  Skin:  Negative for pallor and rash.  Neurological:  Negative for dizziness, tremors, weakness, numbness and headaches.  Psychiatric/Behavioral:  Positive for decreased concentration. Negative for confusion, sleep disturbance and suicidal ideas.     Objective:  BP 110/68 (BP Location: Left Arm, Patient Position: Sitting, Cuff Size: Normal)   Pulse 63   Temp 99 F (37.2 C) (Oral)   Ht 5\' 5"  (1.651 m)   Wt 152 lb 6.4 oz (69.1 kg)   LMP 06/13/2012   SpO2 96%   BMI 25.36 kg/m   BP Readings from Last 3 Encounters:  01/22/22 110/68  09/11/21 122/70  01/30/21 130/68    Wt Readings from Last 3 Encounters:  01/22/22 152 lb 6.4 oz (69.1 kg)  09/11/21 151 lb 3.2 oz (68.6 kg)  01/30/21 154 lb (69.9 kg)    Physical Exam Constitutional:      General: She is not in acute distress.    Appearance: Normal appearance. She is well-developed.  HENT:     Head: Normocephalic.     Right Ear: External ear normal.     Left Ear: External ear normal.     Nose: Nose normal.  Eyes:     General:        Right eye: No discharge.        Left eye:  No discharge.     Conjunctiva/sclera: Conjunctivae normal.     Pupils: Pupils are equal, round, and reactive to light.  Neck:     Thyroid: No thyromegaly.     Vascular: No JVD.     Trachea: No tracheal deviation.  Cardiovascular:     Rate and Rhythm: Normal rate and regular rhythm.     Heart sounds: Normal heart sounds.  Pulmonary:     Effort: No respiratory distress.     Breath sounds: No stridor. No wheezing.  Abdominal:     General: Bowel sounds are normal. There is no distension.     Palpations: Abdomen is soft. There is no mass.     Tenderness: There is no abdominal tenderness. There is no guarding or rebound.  Musculoskeletal:        General: No tenderness.      Cervical back: Normal range of motion and neck supple. No rigidity.  Lymphadenopathy:     Cervical: No cervical adenopathy.  Skin:    Findings: No erythema or rash.  Neurological:     Cranial Nerves: No cranial nerve deficit.     Motor: No abnormal muscle tone.     Coordination: Coordination normal.     Deep Tendon Reflexes: Reflexes normal.  Psychiatric:        Behavior: Behavior normal.        Thought Content: Thought content normal.        Judgment: Judgment normal.     Lab Results  Component Value Date   WBC 6.9 09/04/2021   HGB 13.7 09/04/2021   HCT 40.1 09/04/2021   PLT 347.0 09/04/2021   GLUCOSE 110 (H) 09/04/2021   CHOL 200 09/04/2021   TRIG 87.0 09/04/2021   HDL 54.90 09/04/2021   LDLDIRECT 101.3 11/09/2012   LDLCALC 128 (H) 09/04/2021   ALT 19 09/04/2021   AST 28 09/04/2021   NA 138 09/04/2021   K 3.5 09/04/2021   CL 104 09/04/2021   CREATININE 0.79 09/04/2021   BUN 13 09/04/2021   CO2 28 09/04/2021   TSH 1.47 09/04/2021   INR 1.1 07/19/2019    MM 3D SCREEN BREAST BILATERAL  Result Date: 12/27/2021 CLINICAL DATA:  Screening. EXAM: DIGITAL SCREENING BILATERAL MAMMOGRAM WITH TOMOSYNTHESIS AND CAD TECHNIQUE: Bilateral screening digital craniocaudal and mediolateral oblique mammograms were obtained. Bilateral screening digital breast tomosynthesis was performed. The images were evaluated with computer-aided detection. COMPARISON:  Previous exam(s). ACR Breast Density Category b: There are scattered areas of fibroglandular density. FINDINGS: There are no findings suspicious for malignancy. IMPRESSION: No mammographic evidence of malignancy. A result letter of this screening mammogram will be mailed directly to the patient. RECOMMENDATION: Screening mammogram in one year. (Code:SM-B-01Y) BI-RADS CATEGORY  1: Negative. Electronically Signed   By: Franki Cabot M.D.   On: 12/27/2021 13:18    Assessment & Plan:   Problem List Items Addressed This Visit        Cardiovascular and Mediastinum   Migraine headache - Primary    Continue to use Imitrex prn        Other   Attention deficit disorder    On Adderall  Potential benefits of a long term amphetamines use as well as potential risks  and complications were explained to the patient and were aknowledged.         Meds ordered this encounter  Medications   amphetamine-dextroamphetamine (ADDERALL) 20 MG tablet    Sig: Take 1 tablet (20 mg total) by mouth 2 (two) times  daily. AM and lunch    Dispense:  60 tablet    Refill:  0    Please fill on or after 01/22/22   amphetamine-dextroamphetamine (ADDERALL) 20 MG tablet    Sig: Take 1 tablet (20 mg total) by mouth 2 (two) times daily. AM and lunch    Dispense:  60 tablet    Refill:  0    Please fill on or after 02/21/22   amphetamine-dextroamphetamine (ADDERALL) 20 MG tablet    Sig: Take 1 IN THE MORNING AND 1 AT LUNCH    Dispense:  60 tablet    Refill:  0    Please fill on or after 03/23/22      Follow-up: Return in about 3 months (around 04/23/2022) for a follow-up visit.  Sonda Primes, MD

## 2022-01-22 NOTE — Assessment & Plan Note (Signed)
Continue to use Imitrex prn

## 2022-05-27 ENCOUNTER — Other Ambulatory Visit: Payer: Self-pay | Admitting: Internal Medicine

## 2022-08-12 ENCOUNTER — Ambulatory Visit: Payer: 59 | Admitting: Internal Medicine

## 2022-08-12 VITALS — BP 120/68 | HR 100 | Temp 98.3°F | Ht 65.0 in | Wt 155.0 lb

## 2022-08-12 DIAGNOSIS — F411 Generalized anxiety disorder: Secondary | ICD-10-CM | POA: Diagnosis not present

## 2022-08-12 DIAGNOSIS — G43001 Migraine without aura, not intractable, with status migrainosus: Secondary | ICD-10-CM | POA: Diagnosis not present

## 2022-08-12 DIAGNOSIS — F900 Attention-deficit hyperactivity disorder, predominantly inattentive type: Secondary | ICD-10-CM

## 2022-08-12 DIAGNOSIS — Z0000 Encounter for general adult medical examination without abnormal findings: Secondary | ICD-10-CM

## 2022-08-12 MED ORDER — AMPHETAMINE-DEXTROAMPHETAMINE 20 MG PO TABS: 20.0000 mg | ORAL_TABLET | Freq: Two times a day (BID) | ORAL | 0 refills | Status: DC

## 2022-08-12 MED ORDER — SUMATRIPTAN SUCCINATE 100 MG PO TABS: 100.0000 mg | ORAL_TABLET | ORAL | 3 refills | Status: AC

## 2022-08-12 MED ORDER — AMPHETAMINE-DEXTROAMPHETAMINE 20 MG PO TABS: ORAL_TABLET | ORAL | 0 refills | Status: DC

## 2022-08-12 NOTE — Assessment & Plan Note (Signed)
Continue to use Imitrex prn

## 2022-08-12 NOTE — Progress Notes (Signed)
Subjective:  Patient ID: Heather Freeman, female    DOB: 1965/03/19  Age: 57 y.o. MRN: [id number]  CC: Medication Refill   HPI Heather Freeman presents for ADD, HAs, allergies  Outpatient Medications Prior to Visit  Medication Sig Dispense Refill   Cholecalciferol (VITAMIN D3) 1000 UNITS tablet Take 1,000 Units by mouth daily.     GLUCOSAMINE-CHONDROITIN PO Take 1 tablet by mouth in the morning and at bedtime.     loratadine (CLARITIN) 10 MG tablet Take 10 mg by mouth daily as needed for allergies.      montelukast (SINGULAIR) 10 MG tablet TAKE 1 TABLET BY MOUTH EVERY DAY (Patient taking differently: Take 10 mg by mouth daily as needed (for seasonal allergies).) 90 tablet 3   triamcinolone 0.5%-Eucerin equivalent 1:1 cream mixture Apply topically 2 (two) times daily as needed.     amphetamine-dextroamphetamine (ADDERALL) 20 MG tablet Take 1 tablet (20 mg total) by mouth 2 (two) times daily. AM and lunch 60 tablet 0   amphetamine-dextroamphetamine (ADDERALL) 20 MG tablet Take 1 IN THE MORNING AND 1 AT LUNCH 60 tablet 0   amphetamine-dextroamphetamine (ADDERALL) 20 MG tablet TAKE 1 TABLET BY MOUTH EVERY MORNING AND 1 AT LUNCH 60 tablet 0   SUMAtriptan (IMITREX) 100 MG tablet Take 1 tablet (100 mg total) by mouth See admin instructions. Take 1 tablet by mouth at onset of migraine and may take one additional tablet two hours later, if no relief (max 2 tablets/day) 12 tablet 3   No facility-administered medications prior to visit.    ROS: Review of Systems  Constitutional:  Negative for activity change, appetite change, chills, fatigue and unexpected weight change.  HENT:  Negative for congestion, mouth sores and sinus pressure.   Eyes:  Negative for visual disturbance.  Respiratory:  Negative for cough and chest tightness.   Gastrointestinal:  Negative for abdominal pain and nausea.  Genitourinary:  Negative for difficulty urinating, frequency and vaginal pain.  Musculoskeletal:   Negative for back pain and gait problem.  Skin:  Negative for pallor and rash.  Neurological:  Negative for dizziness, tremors, weakness, numbness and headaches.  Psychiatric/Behavioral:  Positive for decreased concentration. Negative for confusion and sleep disturbance. The patient is not nervous/anxious.     Objective:  BP 120/68 (BP Location: Left Arm, Patient Position: Sitting, Cuff Size: Large)   Pulse 100   Temp 98.3 F (36.8 C) (Oral)   Ht 5\' 5"  (1.651 m)   Wt 155 lb (70.3 kg)   LMP 06/13/2012   SpO2 96%   BMI 25.79 kg/m   BP Readings from Last 3 Encounters:  08/12/22 120/68  01/22/22 110/68  09/11/21 122/70    Wt Readings from Last 3 Encounters:  08/12/22 155 lb (70.3 kg)  01/22/22 152 lb 6.4 oz (69.1 kg)  09/11/21 151 lb 3.2 oz (68.6 kg)    Physical Exam Constitutional:      General: She is not in acute distress.    Appearance: She is well-developed.  HENT:     Head: Normocephalic.     Right Ear: External ear normal.     Left Ear: External ear normal.     Nose: Nose normal.  Eyes:     General:        Right eye: No discharge.        Left eye: No discharge.     Conjunctiva/sclera: Conjunctivae normal.     Pupils: Pupils are equal, round, and reactive to light.  Neck:  Thyroid: No thyromegaly.     Vascular: No JVD.     Trachea: No tracheal deviation.  Cardiovascular:     Rate and Rhythm: Normal rate and regular rhythm.     Heart sounds: Normal heart sounds.  Pulmonary:     Effort: No respiratory distress.     Breath sounds: No stridor. No wheezing.  Abdominal:     General: Bowel sounds are normal. There is no distension.     Palpations: Abdomen is soft. There is no mass.     Tenderness: There is no abdominal tenderness. There is no guarding or rebound.  Musculoskeletal:        General: No tenderness.     Cervical back: Normal range of motion and neck supple. No rigidity.  Lymphadenopathy:     Cervical: No cervical adenopathy.  Skin:     Findings: No erythema or rash.  Neurological:     Cranial Nerves: No cranial nerve deficit.     Motor: No abnormal muscle tone.     Coordination: Coordination normal.     Deep Tendon Reflexes: Reflexes normal.  Psychiatric:        Behavior: Behavior normal.        Thought Content: Thought content normal.        Judgment: Judgment normal.     Lab Results  Component Value Date   WBC 6.9 09/04/2021   HGB 13.7 09/04/2021   HCT 40.1 09/04/2021   PLT 347.0 09/04/2021   GLUCOSE 110 (H) 09/04/2021   CHOL 200 09/04/2021   TRIG 87.0 09/04/2021   HDL 54.90 09/04/2021   LDLDIRECT 101.3 11/09/2012   LDLCALC 128 (H) 09/04/2021   ALT 19 09/04/2021   AST 28 09/04/2021   NA 138 09/04/2021   K 3.5 09/04/2021   CL 104 09/04/2021   CREATININE 0.79 09/04/2021   BUN 13 09/04/2021   CO2 28 09/04/2021   TSH 1.47 09/04/2021   INR 1.1 07/19/2019    MM 3D SCREEN BREAST BILATERAL  Result Date: 12/27/2021 CLINICAL DATA:  Screening. EXAM: DIGITAL SCREENING BILATERAL MAMMOGRAM WITH TOMOSYNTHESIS AND CAD TECHNIQUE: Bilateral screening digital craniocaudal and mediolateral oblique mammograms were obtained. Bilateral screening digital breast tomosynthesis was performed. The images were evaluated with computer-aided detection. COMPARISON:  Previous exam(s). ACR Breast Density Category b: There are scattered areas of fibroglandular density. FINDINGS: There are no findings suspicious for malignancy. IMPRESSION: No mammographic evidence of malignancy. A result letter of this screening mammogram will be mailed directly to the patient. RECOMMENDATION: Screening mammogram in one year. (Code:SM-B-01Y) BI-RADS CATEGORY  1: Negative. Electronically Signed   By: Bary Richard M.D.   On: 12/27/2021 13:18    Assessment & Plan:   Problem List Items Addressed This Visit     Attention deficit disorder - Primary    On Adderall  Potential benefits of a long term amphetamines use as well as potential risks  and  complications were explained to the patient and were aknowledged.      Well adult exam   Relevant Orders   TSH   Urinalysis   CBC with Differential/Platelet   Lipid panel   Comprehensive metabolic panel   Generalized anxiety disorder    Xanax prn-rare use  Potential benefits of a long term benzodiazepines  use as well as potential risks  and complications were explained to the patient and were aknowledged.      Migraine headache    Continue to use Imitrex prn      Relevant Medications  SUMAtriptan (IMITREX) 100 MG tablet      Meds ordered this encounter  Medications   amphetamine-dextroamphetamine (ADDERALL) 20 MG tablet    Sig: Take 1 tablet (20 mg total) by mouth 2 (two) times daily. AM and lunch    Dispense:  60 tablet    Refill:  0    Please fill on or after 08/12/22   SUMAtriptan (IMITREX) 100 MG tablet    Sig: Take 1 tablet (100 mg total) by mouth See admin instructions. Take 1 tablet by mouth at onset of migraine and may take one additional tablet two hours later, if no relief (max 2 tablets/day)    Dispense:  12 tablet    Refill:  3   amphetamine-dextroamphetamine (ADDERALL) 20 MG tablet    Sig: Take 1 IN THE MORNING AND 1 AT LUNCH    Dispense:  60 tablet    Refill:  0    Please fill on or after 09/11/22   amphetamine-dextroamphetamine (ADDERALL) 20 MG tablet    Sig: Take 1 tablet (20 mg total) by mouth 2 (two) times daily. TAKE 1 TABLET BY MOUTH EVERY MORNING AND 1 AT LUNCH    Dispense:  60 tablet    Refill:  0    Please fill on or after 10/11/22      Follow-up: Return in about 3 months (around 11/12/2022) for Wellness Exam.  Sonda Primes, MD

## 2022-08-12 NOTE — Assessment & Plan Note (Signed)
Xanax prn rare use  Potential benefits of a long term benzodiazepines  use as well as potential risks  and complications were explained to the patient and were aknowledged.  

## 2022-08-12 NOTE — Assessment & Plan Note (Signed)
On Adderall  Potential benefits of a long term amphetamines  use as well as potential risks  and complications were explained to the patient and were aknowledged. 

## 2022-10-22 ENCOUNTER — Other Ambulatory Visit: Payer: 59

## 2022-10-22 DIAGNOSIS — Z0000 Encounter for general adult medical examination without abnormal findings: Secondary | ICD-10-CM

## 2022-10-22 LAB — CBC WITH DIFFERENTIAL/PLATELET
Basophils Absolute: 0 10*3/uL (ref 0.0–0.1)
Basophils Relative: 0.6 % (ref 0.0–3.0)
Eosinophils Absolute: 0.2 10*3/uL (ref 0.0–0.7)
Eosinophils Relative: 2.3 % (ref 0.0–5.0)
HCT: 37.3 % (ref 36.0–46.0)
Hemoglobin: 12.6 g/dL (ref 12.0–15.0)
Lymphocytes Relative: 34.9 % (ref 12.0–46.0)
Lymphs Abs: 2.7 10*3/uL (ref 0.7–4.0)
MCHC: 33.9 g/dL (ref 30.0–36.0)
MCV: 93.8 fl (ref 78.0–100.0)
Monocytes Absolute: 0.3 10*3/uL (ref 0.1–1.0)
Monocytes Relative: 4.3 % (ref 3.0–12.0)
Neutro Abs: 4.5 10*3/uL (ref 1.4–7.7)
Neutrophils Relative %: 57.9 % (ref 43.0–77.0)
Platelets: 393 10*3/uL (ref 150.0–400.0)
RBC: 3.98 Mil/uL (ref 3.87–5.11)
RDW: 12.9 % (ref 11.5–15.5)
WBC: 7.8 10*3/uL (ref 4.0–10.5)

## 2022-10-22 LAB — COMPREHENSIVE METABOLIC PANEL WITH GFR
ALT: 14 U/L (ref 0–35)
AST: 24 U/L (ref 0–37)
Albumin: 4.4 g/dL (ref 3.5–5.2)
Alkaline Phosphatase: 59 U/L (ref 39–117)
BUN: 14 mg/dL (ref 6–23)
CO2: 26 meq/L (ref 19–32)
Calcium: 9.3 mg/dL (ref 8.4–10.5)
Chloride: 106 meq/L (ref 96–112)
Creatinine, Ser: 0.79 mg/dL (ref 0.40–1.20)
GFR: 81.96 mL/min
Glucose, Bld: 119 mg/dL — ABNORMAL HIGH (ref 70–99)
Potassium: 4.1 meq/L (ref 3.5–5.1)
Sodium: 138 meq/L (ref 135–145)
Total Bilirubin: 0.8 mg/dL (ref 0.2–1.2)
Total Protein: 6.9 g/dL (ref 6.0–8.3)

## 2022-10-22 LAB — LIPID PANEL
Cholesterol: 214 mg/dL — ABNORMAL HIGH (ref 0–200)
HDL: 60.8 mg/dL (ref >39.00)
LDL Cholesterol: 138 mg/dL — ABNORMAL HIGH (ref 0–99)
NonHDL: 152.75
Total CHOL/HDL Ratio: 4
Triglycerides: 72 mg/dL (ref 0.0–149.0)
VLDL: 14.4 mg/dL (ref 0.0–40.0)

## 2022-10-22 LAB — URINALYSIS, ROUTINE W REFLEX MICROSCOPIC
Bilirubin Urine: NEGATIVE
Hgb urine dipstick: NEGATIVE
Ketones, ur: NEGATIVE
Nitrite: NEGATIVE
Specific Gravity, Urine: >=1.03 — AB (ref 1.000–1.030)
Urine Glucose: NEGATIVE
Urobilinogen, UA: 0.2 (ref 0.0–1.0)
pH: 6 (ref 5.0–8.0)

## 2022-10-22 LAB — TSH: TSH: 1.59 u[IU]/mL (ref 0.35–5.50)

## 2022-11-11 ENCOUNTER — Ambulatory Visit (INDEPENDENT_AMBULATORY_CARE_PROVIDER_SITE_OTHER): Payer: 59 | Admitting: Internal Medicine

## 2022-11-11 VITALS — BP 110/62 | HR 76 | Temp 98.2°F | Ht 65.0 in | Wt 153.0 lb

## 2022-11-11 DIAGNOSIS — S300XXA Contusion of lower back and pelvis, initial encounter: Secondary | ICD-10-CM | POA: Insufficient documentation

## 2022-11-11 DIAGNOSIS — Z0000 Encounter for general adult medical examination without abnormal findings: Secondary | ICD-10-CM

## 2022-11-11 DIAGNOSIS — N3000 Acute cystitis without hematuria: Secondary | ICD-10-CM

## 2022-11-11 DIAGNOSIS — F900 Attention-deficit hyperactivity disorder, predominantly inattentive type: Secondary | ICD-10-CM

## 2022-11-11 MED ORDER — AMPHETAMINE-DEXTROAMPHETAMINE 20 MG PO TABS: 20.0000 mg | ORAL_TABLET | Freq: Two times a day (BID) | ORAL | 0 refills | Status: DC

## 2022-11-11 MED ORDER — AMPHETAMINE-DEXTROAMPHETAMINE 20 MG PO TABS: ORAL_TABLET | ORAL | 0 refills | Status: DC

## 2022-11-11 MED ORDER — CEPHALEXIN 500 MG PO CAPS: 1000.0000 mg | ORAL_CAPSULE | Freq: Two times a day (BID) | ORAL | 1 refills | Status: AC

## 2022-11-11 NOTE — Assessment & Plan Note (Signed)
On Adderall  Potential benefits of a long term amphetamines  use as well as potential risks  and complications were explained to the patient and were aknowledged. 

## 2022-11-11 NOTE — Assessment & Plan Note (Addendum)
We discussed age appropriate health related issues, including available/recomended screening tests and vaccinations. We discussed a need for adhering to healthy diet and exercise. Labs/EKG were reviewed/ordered. All questions were answered. Cologuard 2023 GYN yearly Mammogram

## 2022-11-11 NOTE — Assessment & Plan Note (Signed)
Keflex po

## 2022-11-11 NOTE — Progress Notes (Signed)
Subjective:  Patient ID: Heather Freeman, female    DOB: 04-02-1965  Age: 57 y.o. MRN: [id number]  CC: Annual Exam (Discuss fall and lump on rt buttocks)   HPI Heather Freeman presents for ADD, well exam  Olegario Messier fell on 10/19/22 on the steps - L buttock hematoma of a grapefruit size  Outpatient Medications Prior to Visit  Medication Sig Dispense Refill   Cholecalciferol (VITAMIN D3) 1000 UNITS tablet Take 1,000 Units by mouth daily.     GLUCOSAMINE-CHONDROITIN PO Take 1 tablet by mouth in the morning and at bedtime.     loratadine (CLARITIN) 10 MG tablet Take 10 mg by mouth daily as needed for allergies.      montelukast (SINGULAIR) 10 MG tablet TAKE 1 TABLET BY MOUTH EVERY DAY (Patient taking differently: Take 10 mg by mouth daily as needed (for seasonal allergies).) 90 tablet 3   SUMAtriptan (IMITREX) 100 MG tablet Take 1 tablet (100 mg total) by mouth See admin instructions. Take 1 tablet by mouth at onset of migraine and may take one additional tablet two hours later, if no relief (max 2 tablets/day) 12 tablet 3   triamcinolone 0.5%-Eucerin equivalent 1:1 cream mixture Apply topically 2 (two) times daily as needed.     amphetamine-dextroamphetamine (ADDERALL) 20 MG tablet Take 1 tablet (20 mg total) by mouth 2 (two) times daily. AM and lunch 60 tablet 0   amphetamine-dextroamphetamine (ADDERALL) 20 MG tablet Take 1 IN THE MORNING AND 1 AT LUNCH 60 tablet 0   amphetamine-dextroamphetamine (ADDERALL) 20 MG tablet Take 1 tablet (20 mg total) by mouth 2 (two) times daily. TAKE 1 TABLET BY MOUTH EVERY MORNING AND 1 AT LUNCH 60 tablet 0   No facility-administered medications prior to visit.    ROS: Review of Systems  Constitutional:  Negative for activity change, appetite change, chills, fatigue and unexpected weight change.  HENT:  Negative for congestion, mouth sores and sinus pressure.   Eyes:  Negative for visual disturbance.  Respiratory:  Negative for cough and chest  tightness.   Gastrointestinal:  Negative for abdominal pain and nausea.  Genitourinary:  Negative for difficulty urinating, frequency and vaginal pain.  Musculoskeletal:  Negative for back pain and gait problem.  Skin:  Positive for color change. Negative for pallor and rash.  Neurological:  Negative for dizziness, tremors, weakness, numbness and headaches.  Psychiatric/Behavioral:  Negative for confusion, sleep disturbance and suicidal ideas.     Objective:  BP 110/62 (BP Location: Left Arm, Patient Position: Sitting, Cuff Size: Normal)   Pulse 76   Temp 98.2 F (36.8 C) (Oral)   Ht 5\' 5"  (1.651 m)   Wt 153 lb (69.4 kg)   LMP 06/13/2012   SpO2 98%   BMI 25.46 kg/m   BP Readings from Last 3 Encounters:  11/11/22 110/62  08/12/22 120/68  01/22/22 110/68    Wt Readings from Last 3 Encounters:  11/11/22 153 lb (69.4 kg)  08/12/22 155 lb (70.3 kg)  01/22/22 152 lb 6.4 oz (69.1 kg)    Physical Exam Constitutional:      General: She is not in acute distress.    Appearance: Normal appearance. She is well-developed.  HENT:     Head: Normocephalic.     Right Ear: External ear normal.     Left Ear: External ear normal.     Nose: Nose normal.  Eyes:     General:        Right eye: No discharge.  Left eye: No discharge.     Conjunctiva/sclera: Conjunctivae normal.     Pupils: Pupils are equal, round, and reactive to light.  Neck:     Thyroid: No thyromegaly.     Vascular: No JVD.     Trachea: No tracheal deviation.  Cardiovascular:     Rate and Rhythm: Normal rate and regular rhythm.     Heart sounds: Normal heart sounds.  Pulmonary:     Effort: No respiratory distress.     Breath sounds: No stridor. No wheezing.  Abdominal:     General: Bowel sounds are normal. There is no distension.     Palpations: Abdomen is soft. There is no mass.     Tenderness: There is no abdominal tenderness. There is no guarding or rebound.  Musculoskeletal:        General: Tenderness  present.     Cervical back: Normal range of motion and neck supple. No rigidity.  Lymphadenopathy:     Cervical: No cervical adenopathy.  Skin:    Findings: No erythema or rash.  Neurological:     Cranial Nerves: No cranial nerve deficit.     Motor: No abnormal muscle tone.     Coordination: Coordination normal.     Deep Tendon Reflexes: Reflexes normal.  Psychiatric:        Behavior: Behavior normal.        Thought Content: Thought content normal.        Judgment: Judgment normal.     L buttock hematoma of a grapefruit size  Lab Results  Component Value Date   WBC 7.8 10/22/2022   HGB 12.6 10/22/2022   HCT 37.3 10/22/2022   PLT 393.0 10/22/2022   GLUCOSE 119 (H) 10/22/2022   CHOL 214 (H) 10/22/2022   TRIG 72.0 10/22/2022   HDL 60.80 10/22/2022   LDLDIRECT 101.3 11/09/2012   LDLCALC 138 (H) 10/22/2022   ALT 14 10/22/2022   AST 24 10/22/2022   NA 138 10/22/2022   K 4.1 10/22/2022   CL 106 10/22/2022   CREATININE 0.79 10/22/2022   BUN 14 10/22/2022   CO2 26 10/22/2022   TSH 1.59 10/22/2022   INR 1.1 07/19/2019    MM 3D SCREEN BREAST BILATERAL  Result Date: 12/27/2021 CLINICAL DATA:  Screening. EXAM: DIGITAL SCREENING BILATERAL MAMMOGRAM WITH TOMOSYNTHESIS AND CAD TECHNIQUE: Bilateral screening digital craniocaudal and mediolateral oblique mammograms were obtained. Bilateral screening digital breast tomosynthesis was performed. The images were evaluated with computer-aided detection. COMPARISON:  Previous exam(s). ACR Breast Density Category b: There are scattered areas of fibroglandular density. FINDINGS: There are no findings suspicious for malignancy. IMPRESSION: No mammographic evidence of malignancy. A result letter of this screening mammogram will be mailed directly to the patient. RECOMMENDATION: Screening mammogram in one year. (Code:SM-B-01Y) BI-RADS CATEGORY  1: Negative. Electronically Signed   By: Bary Richard M.D.   On: 12/27/2021 13:18    Assessment &  Plan:   Problem List Items Addressed This Visit     Attention deficit disorder    On Adderall  Potential benefits of a long term amphetamines use as well as potential risks  and complications were explained to the patient and were aknowledged.      Well adult exam - Primary    We discussed age appropriate health related issues, including available/recomended screening tests and vaccinations. We discussed a need for adhering to healthy diet and exercise. Labs/EKG were reviewed/ordered. All questions were answered. Cologuard 2023 GYN yearly Mammogram  Acute cystitis    Keflex po      Hematoma of right buttock     L buttock hematoma of a grapefruit size          Meds ordered this encounter  Medications   amphetamine-dextroamphetamine (ADDERALL) 20 MG tablet    Sig: Take 1 tablet (20 mg total) by mouth 2 (two) times daily. AM and lunch    Dispense:  60 tablet    Refill:  0    Please fill on or after 02/06/23   cephALEXin (KEFLEX) 500 MG capsule    Sig: Take 2 capsules (1,000 mg total) by mouth 2 (two) times daily.    Dispense:  16 capsule    Refill:  1   amphetamine-dextroamphetamine (ADDERALL) 20 MG tablet    Sig: Take 1 IN THE MORNING AND 1 AT LUNCH    Dispense:  60 tablet    Refill:  0    Please fill on or after 12/08/22   amphetamine-dextroamphetamine (ADDERALL) 20 MG tablet    Sig: Take 1 tablet (20 mg total) by mouth 2 (two) times daily. TAKE 1 TABLET BY MOUTH EVERY MORNING AND 1 AT LUNCH    Dispense:  60 tablet    Refill:  0    Please fill on or after 01/07/23      Follow-up: No follow-ups on file.  Sonda Primes, MD

## 2022-11-11 NOTE — Assessment & Plan Note (Signed)
L buttock hematoma of a grapefruit size

## 2022-12-20 IMAGING — MG MM DIGITAL SCREENING BILAT W/ TOMO AND CAD
8 series · 8 of 24 positions shown · non-contrast
Comparison: Previous exam(s).

CLINICAL DATA: Screening.

EXAM:
DIGITAL SCREENING BILATERAL MAMMOGRAM WITH TOMOSYNTHESIS AND CAD
TECHNIQUE: Bilateral screening digital craniocaudal and mediolateral oblique
mammograms were obtained. Bilateral screening digital breast
tomosynthesis was performed. The images were evaluated with
computer-aided detection.

[R CC synth-2D]
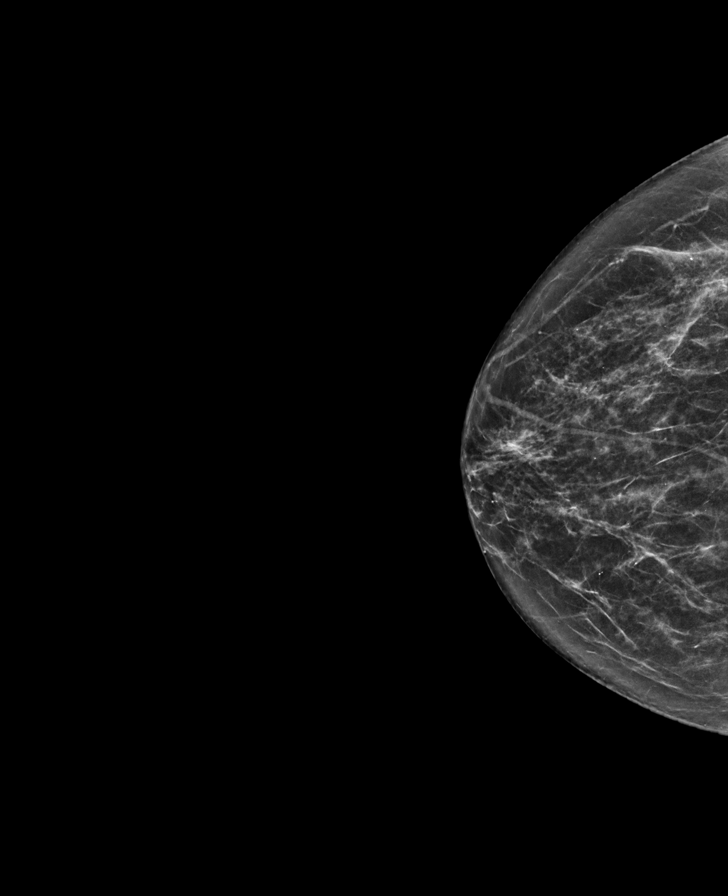

[R MLO synth-2D]
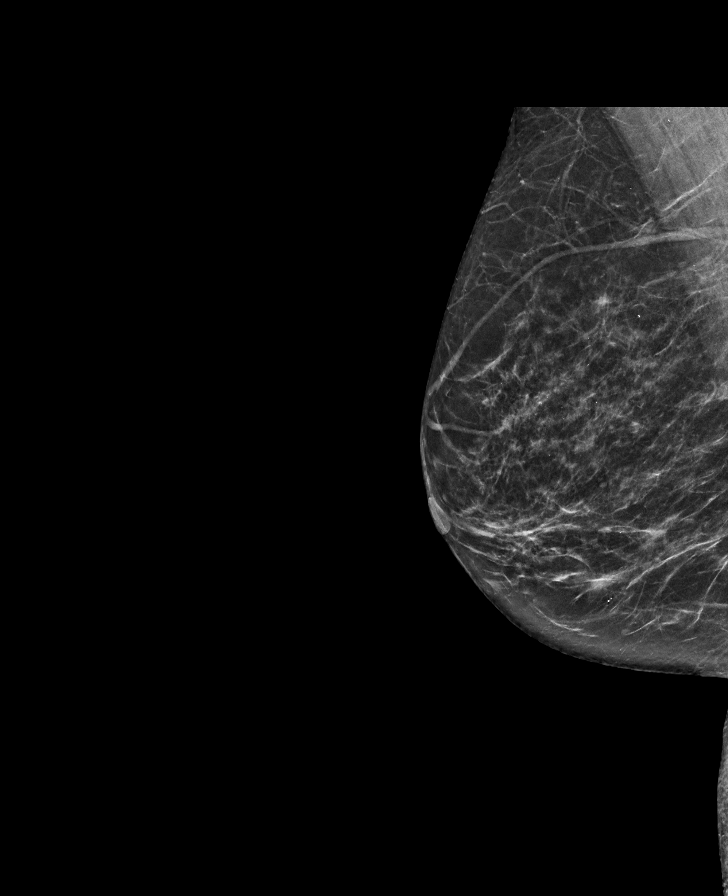

[L CC synth-2D]
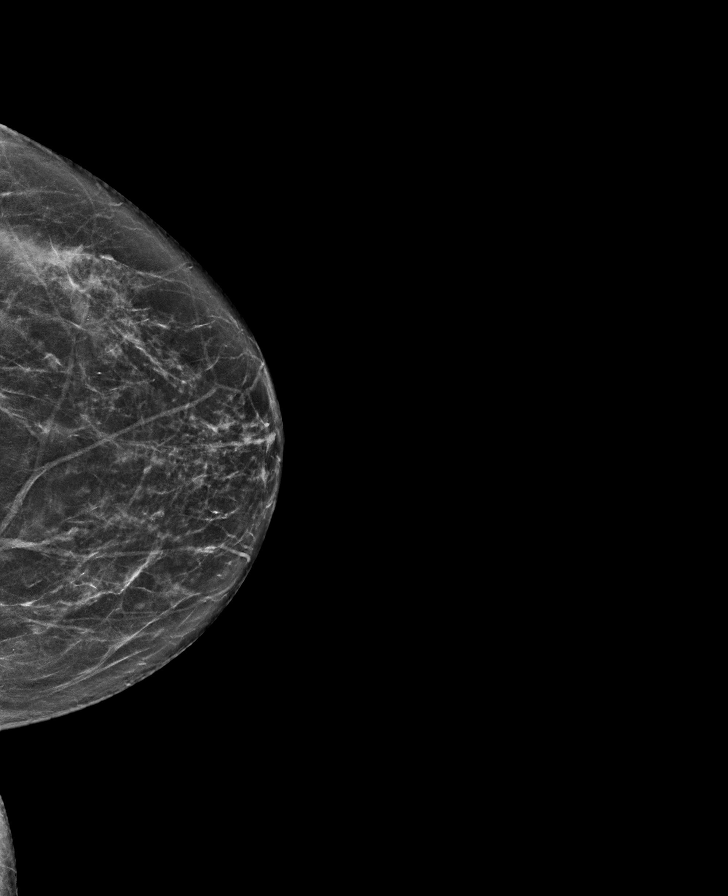

[L MLO synth-2D]
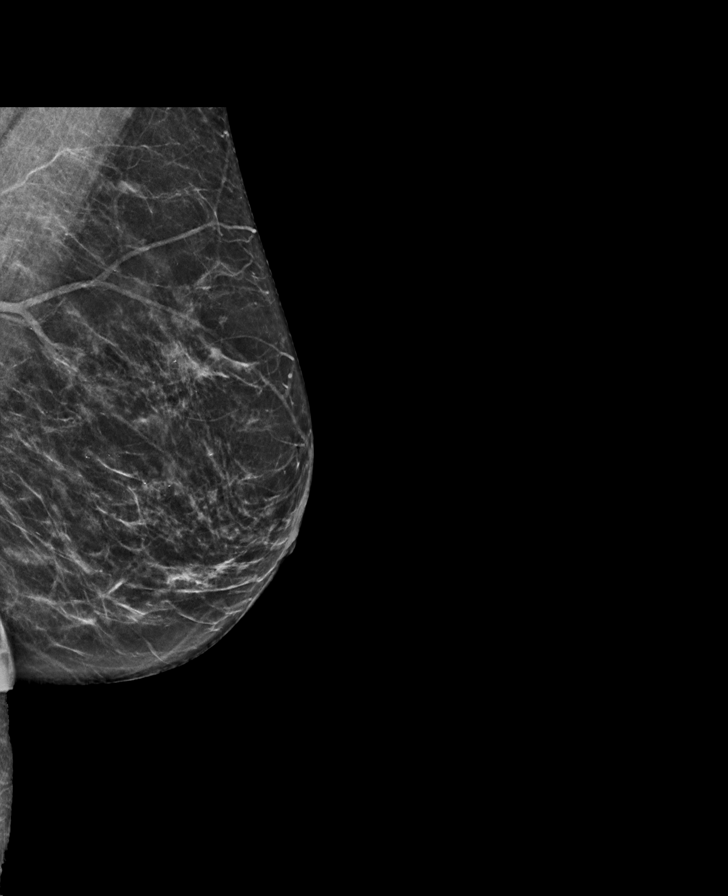

[L CC tomo · tomo slice 34/67.0]
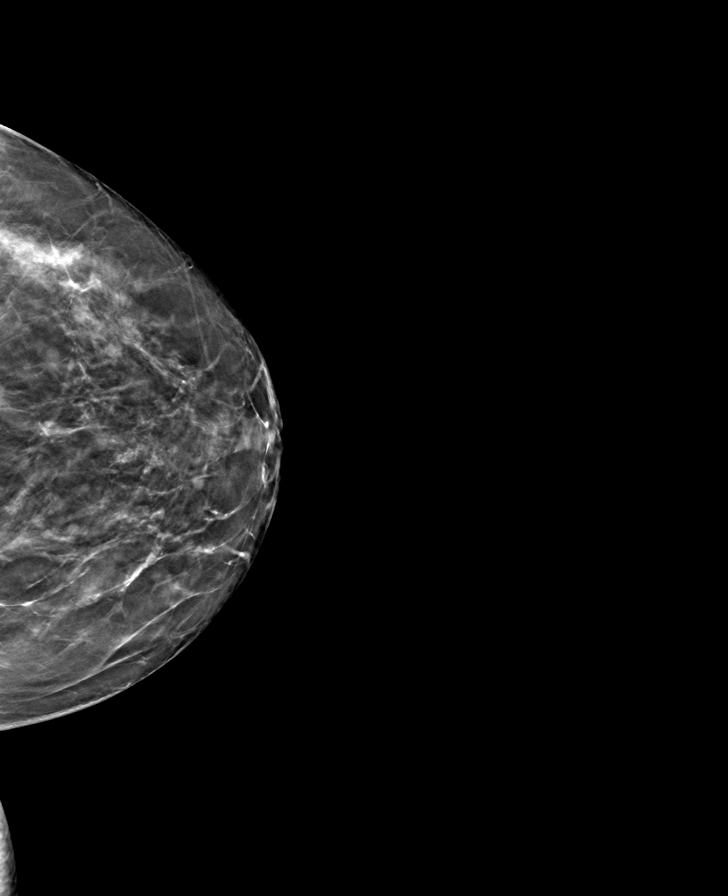

[L MLO tomo · tomo slice 33/65.0]
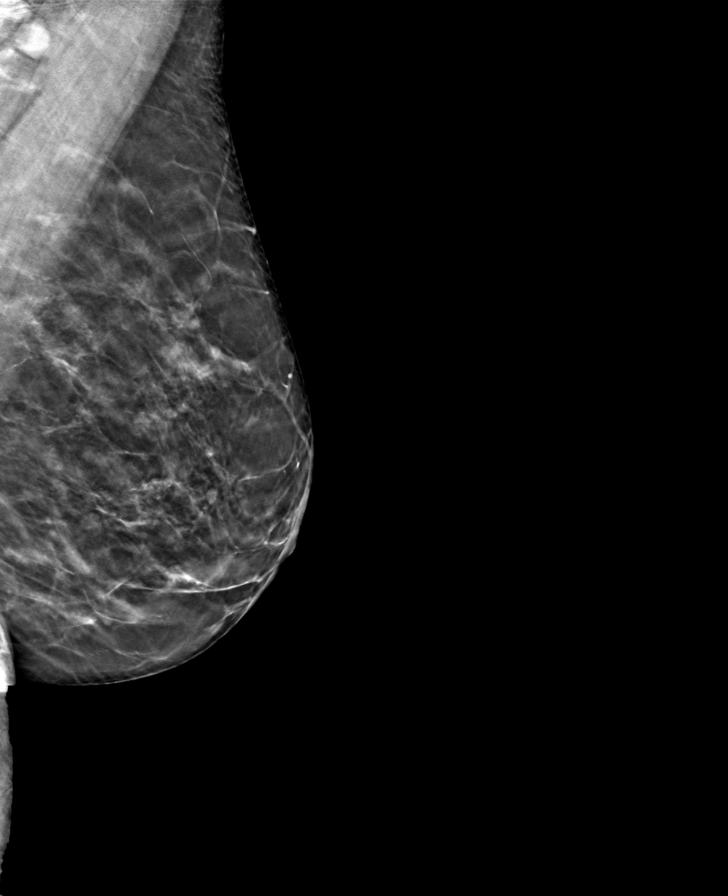

[R CC tomo · tomo slice 32/63.0]
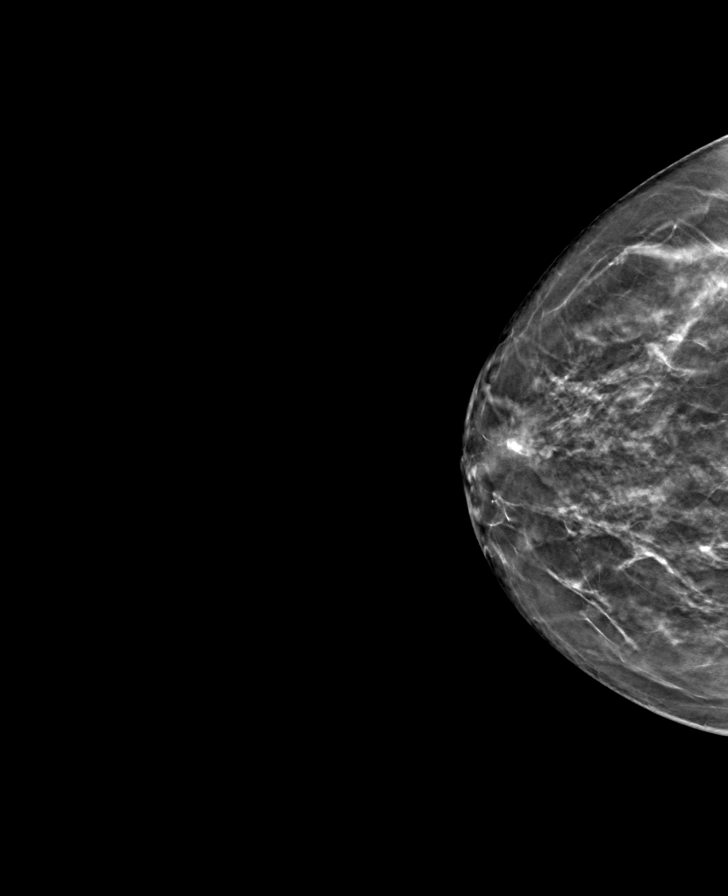

[R MLO tomo · tomo slice 33/65.0]
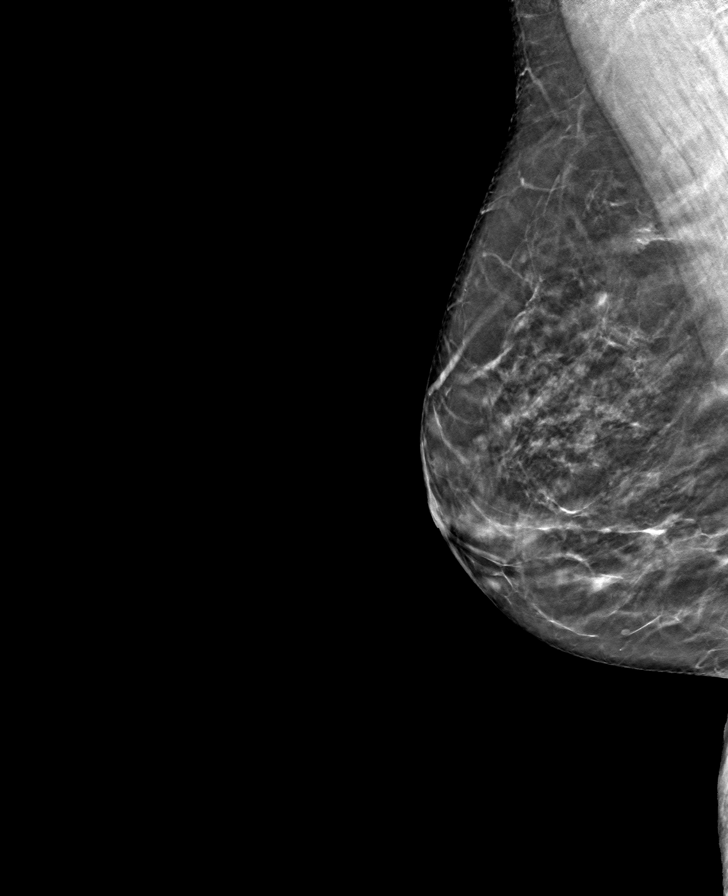

[8 of 24 positions shown; findings below may reference images not displayed]

ACR Breast Density Category b: There are scattered areas of
fibroglandular density.
FINDINGS: There are no findings suspicious for malignancy.
IMPRESSION: No mammographic evidence of malignancy. A result letter of this
screening mammogram will be mailed directly to the patient.

RECOMMENDATION:
Screening mammogram in one year. (Code:51-O-LD2)

BI-RADS CATEGORY  1: Negative.

## 2023-03-14 ENCOUNTER — Other Ambulatory Visit: Payer: Self-pay | Admitting: Internal Medicine

## 2023-03-14 DIAGNOSIS — Z0000 Encounter for general adult medical examination without abnormal findings: Secondary | ICD-10-CM

## 2023-03-25 ENCOUNTER — Ambulatory Visit
Admission: RE | Admit: 2023-03-25 | Discharge: 2023-03-25 | Disposition: A | Discharge status: Home / Self Care | Payer: 59 | Source: Ambulatory Visit | Attending: Internal Medicine | Admitting: Internal Medicine

## 2023-03-25 DIAGNOSIS — Z0000 Encounter for general adult medical examination without abnormal findings: Secondary | ICD-10-CM

## 2023-05-19 ENCOUNTER — Other Ambulatory Visit: Payer: Self-pay | Admitting: Internal Medicine

## 2023-05-21 MED ORDER — AMPHETAMINE-DEXTROAMPHETAMINE 20 MG PO TABS: 20.0000 mg | ORAL_TABLET | Freq: Two times a day (BID) | ORAL | 0 refills | Status: DC

## 2023-08-05 ENCOUNTER — Ambulatory Visit: Admitting: Internal Medicine

## 2023-08-05 VITALS — BP 116/76 | HR 83 | Temp 98.3°F | Ht 65.0 in | Wt 156.0 lb

## 2023-08-05 DIAGNOSIS — G43001 Migraine without aura, not intractable, with status migrainosus: Secondary | ICD-10-CM

## 2023-08-05 DIAGNOSIS — F411 Generalized anxiety disorder: Secondary | ICD-10-CM | POA: Diagnosis not present

## 2023-08-05 DIAGNOSIS — F900 Attention-deficit hyperactivity disorder, predominantly inattentive type: Secondary | ICD-10-CM

## 2023-08-05 MED ORDER — MONTELUKAST SODIUM 10 MG PO TABS: 10.0000 mg | ORAL_TABLET | Freq: Every day | ORAL | 3 refills | Status: AC

## 2023-08-05 MED ORDER — AMPHETAMINE-DEXTROAMPHETAMINE 20 MG PO TABS
20.0000 mg | ORAL_TABLET | Freq: Two times a day (BID) | ORAL | 0 refills | Status: DC
Start: 2023-08-05 — End: 2023-11-18

## 2023-08-05 MED ORDER — AMPHETAMINE-DEXTROAMPHETAMINE 20 MG PO TABS: ORAL_TABLET | ORAL | 0 refills | Status: DC

## 2023-08-05 MED ORDER — ALPRAZOLAM 0.25 MG PO TABS: 0.2500 mg | ORAL_TABLET | Freq: Two times a day (BID) | ORAL | 2 refills | Status: AC | PRN

## 2023-08-05 MED ORDER — AMPHETAMINE-DEXTROAMPHETAMINE 20 MG PO TABS: 20.0000 mg | ORAL_TABLET | Freq: Two times a day (BID) | ORAL | 0 refills | Status: DC

## 2023-08-05 NOTE — Assessment & Plan Note (Signed)
 On Adderall  Potential benefits of a long term amphetamines  use as well as potential risks  and complications were explained to the patient and were aknowledged.

## 2023-08-05 NOTE — Progress Notes (Signed)
 Subjective:  Patient ID: Heather Freeman, female    DOB: 01-02-1966  Age: 58 y.o. MRN: [id number]  CC: Medication Refill   HPI Khloei Spiker Armendarez presents for ADD, HAs, allergies  Outpatient Medications Prior to Visit  Medication Sig Dispense Refill   cephALEXin  (KEFLEX ) 500 MG capsule Take 2 capsules (1,000 mg total) by mouth 2 (two) times daily. 16 capsule 1   Cholecalciferol (VITAMIN D3) 1000 UNITS tablet Take 1,000 Units by mouth daily.     GLUCOSAMINE-CHONDROITIN PO Take 1 tablet by mouth in the morning and at bedtime.     loratadine (CLARITIN) 10 MG tablet Take 10 mg by mouth daily as needed for allergies.      SUMAtriptan  (IMITREX ) 100 MG tablet Take 1 tablet (100 mg total) by mouth See admin instructions. Take 1 tablet by mouth at onset of migraine and may take one additional tablet two hours later, if no relief (max 2 tablets/day) 12 tablet 3   triamcinolone  0.5%-Eucerin equivalent 1:1 cream mixture Apply topically 2 (two) times daily as needed.     amphetamine -dextroamphetamine  (ADDERALL) 20 MG tablet Take 1 IN THE MORNING AND 1 AT LUNCH 60 tablet 0   amphetamine -dextroamphetamine  (ADDERALL) 20 MG tablet Take 1 tablet (20 mg total) by mouth 2 (two) times daily. TAKE 1 TABLET BY MOUTH EVERY MORNING AND 1 AT LUNCH 60 tablet 0   amphetamine -dextroamphetamine  (ADDERALL) 20 MG tablet Take 1 tablet (20 mg total) by mouth 2 (two) times daily. AM and lunch 60 tablet 0   montelukast  (SINGULAIR ) 10 MG tablet TAKE 1 TABLET BY MOUTH EVERY DAY (Patient taking differently: Take 10 mg by mouth daily as needed (for seasonal allergies).) 90 tablet 3   No facility-administered medications prior to visit.    ROS: Review of Systems  Constitutional:  Negative for activity change, appetite change, chills, fatigue and unexpected weight change.  HENT:  Negative for congestion, mouth sores and sinus pressure.   Eyes:  Negative for visual disturbance.  Respiratory:  Negative for cough and chest  tightness.   Gastrointestinal:  Negative for abdominal pain and nausea.  Genitourinary:  Negative for difficulty urinating, frequency, urgency and vaginal pain.  Musculoskeletal:  Negative for back pain and gait problem.  Skin:  Negative for pallor and rash.  Neurological:  Negative for dizziness, tremors, weakness, numbness and headaches.  Psychiatric/Behavioral:  Positive for decreased concentration. Negative for confusion, sleep disturbance and suicidal ideas. The patient is nervous/anxious.     Objective:  BP 116/76   Pulse 83   Temp 98.3 F (36.8 C) (Oral)   Ht 5' 5 (1.651 m)   Wt 156 lb (70.8 kg)   LMP 06/13/2012   SpO2 98%   BMI 25.96 kg/m   BP Readings from Last 3 Encounters:  08/05/23 116/76  11/11/22 110/62  08/12/22 120/68    Wt Readings from Last 3 Encounters:  08/05/23 156 lb (70.8 kg)  11/11/22 153 lb (69.4 kg)  08/12/22 155 lb (70.3 kg)    Physical Exam Constitutional:      General: She is not in acute distress.    Appearance: Normal appearance. She is well-developed.  HENT:     Head: Normocephalic.     Right Ear: External ear normal.     Left Ear: External ear normal.     Nose: Nose normal.  Eyes:     General:        Right eye: No discharge.        Left eye: No  discharge.     Conjunctiva/sclera: Conjunctivae normal.     Pupils: Pupils are equal, round, and reactive to light.  Neck:     Thyroid : No thyromegaly.     Vascular: No JVD.     Trachea: No tracheal deviation.  Cardiovascular:     Rate and Rhythm: Normal rate and regular rhythm.     Heart sounds: Normal heart sounds.  Pulmonary:     Effort: No respiratory distress.     Breath sounds: No stridor. No wheezing.  Abdominal:     General: Bowel sounds are normal. There is no distension.     Palpations: Abdomen is soft. There is no mass.     Tenderness: There is no abdominal tenderness. There is no guarding or rebound.  Musculoskeletal:        General: No tenderness.     Cervical back:  Normal range of motion and neck supple. No rigidity.  Lymphadenopathy:     Cervical: No cervical adenopathy.  Skin:    Findings: No erythema or rash.  Neurological:     Mental Status: She is oriented to person, place, and time.     Cranial Nerves: No cranial nerve deficit.     Motor: No abnormal muscle tone.     Coordination: Coordination normal.     Deep Tendon Reflexes: Reflexes normal.  Psychiatric:        Behavior: Behavior normal.        Thought Content: Thought content normal.        Judgment: Judgment normal.     Lab Results  Component Value Date   WBC 7.8 10/22/2022   HGB 12.6 10/22/2022   HCT 37.3 10/22/2022   PLT 393.0 10/22/2022   GLUCOSE 119 (H) 10/22/2022   CHOL 214 (H) 10/22/2022   TRIG 72.0 10/22/2022   HDL 60.80 10/22/2022   LDLDIRECT 101.3 11/09/2012   LDLCALC 138 (H) 10/22/2022   ALT 14 10/22/2022   AST 24 10/22/2022   NA 138 10/22/2022   K 4.1 10/22/2022   CL 106 10/22/2022   CREATININE 0.79 10/22/2022   BUN 14 10/22/2022   CO2 26 10/22/2022   TSH 1.59 10/22/2022   INR 1.1 07/19/2019    MM 3D SCREENING MAMMOGRAM BILATERAL BREAST Result Date: 03/28/2023 CLINICAL DATA:  Screening. EXAM: DIGITAL SCREENING BILATERAL MAMMOGRAM WITH TOMOSYNTHESIS AND CAD TECHNIQUE: Bilateral screening digital craniocaudal and mediolateral oblique mammograms were obtained. Bilateral screening digital breast tomosynthesis was performed. The images were evaluated with computer-aided detection. COMPARISON:  Previous exam(s). ACR Breast Density Category b: There are scattered areas of fibroglandular density. FINDINGS: There are no findings suspicious for malignancy. IMPRESSION: No mammographic evidence of malignancy. A result letter of this screening mammogram will be mailed directly to the patient. RECOMMENDATION: Screening mammogram in one year. (Code:SM-B-01Y) BI-RADS CATEGORY  1: Negative. Electronically Signed   By: Rosaline Collet M.D.   On: 03/28/2023 12:14    Assessment  & Plan:   Problem List Items Addressed This Visit   None     Meds ordered this encounter  Medications   montelukast  (SINGULAIR ) 10 MG tablet    Sig: Take 1 tablet (10 mg total) by mouth daily.    Dispense:  90 tablet    Refill:  3   amphetamine -dextroamphetamine  (ADDERALL) 20 MG tablet    Sig: Take 1 IN THE MORNING AND 1 AT LUNCH    Dispense:  60 tablet    Refill:  0    Please fill on or after 10/03/23  ALPRAZolam  (XANAX ) 0.25 MG tablet    Sig: Take 1-2 tablets (0.25-0.5 mg total) by mouth 2 (two) times daily as needed for anxiety.    Dispense:  60 tablet    Refill:  2   amphetamine -dextroamphetamine  (ADDERALL) 20 MG tablet    Sig: Take 1 tablet (20 mg total) by mouth 2 (two) times daily. TAKE 1 TABLET BY MOUTH EVERY MORNING AND 1 AT LUNCH    Dispense:  60 tablet    Refill:  0    Please fill on or after 08/04/23   amphetamine -dextroamphetamine  (ADDERALL) 20 MG tablet    Sig: Take 1 tablet (20 mg total) by mouth 2 (two) times daily. AM and lunch    Dispense:  60 tablet    Refill:  0    Please fill on or after 09/03/23      Follow-up: No follow-ups on file.  Marolyn Noel, MD

## 2023-08-05 NOTE — Assessment & Plan Note (Signed)
Xanax prn rare use  Potential benefits of a long term benzodiazepines  use as well as potential risks  and complications were explained to the patient and were aknowledged.  

## 2023-08-05 NOTE — Assessment & Plan Note (Signed)
Continue to use Imitrex prn

## 2023-10-11 ENCOUNTER — Encounter: Payer: Self-pay | Admitting: Internal Medicine

## 2023-11-10 ENCOUNTER — Other Ambulatory Visit

## 2023-11-10 ENCOUNTER — Other Ambulatory Visit: Payer: Self-pay | Admitting: Internal Medicine

## 2023-11-10 ENCOUNTER — Telehealth: Payer: Self-pay

## 2023-11-10 DIAGNOSIS — Z0000 Encounter for general adult medical examination without abnormal findings: Secondary | ICD-10-CM | POA: Diagnosis not present

## 2023-11-10 LAB — COMPREHENSIVE METABOLIC PANEL WITH GFR
ALT: 11 U/L (ref 0–35)
AST: 19 U/L (ref 0–37)
Albumin: 4.5 g/dL (ref 3.5–5.2)
Alkaline Phosphatase: 55 U/L (ref 39–117)
BUN: 14 mg/dL (ref 6–23)
CO2: 28 meq/L (ref 19–32)
Calcium: 9.4 mg/dL (ref 8.4–10.5)
Chloride: 103 meq/L (ref 96–112)
Creatinine, Ser: 0.76 mg/dL (ref 0.40–1.20)
GFR: 85.22 mL/min (ref >60.00)
Glucose, Bld: 139 mg/dL — ABNORMAL HIGH (ref 70–99)
Potassium: 4.1 meq/L (ref 3.5–5.1)
Sodium: 138 meq/L (ref 135–145)
Total Bilirubin: 0.5 mg/dL (ref 0.2–1.2)
Total Protein: 6.9 g/dL (ref 6.0–8.3)

## 2023-11-10 LAB — CBC WITH DIFFERENTIAL/PLATELET
Basophils Absolute: 0.1 K/uL (ref 0.0–0.1)
Basophils Relative: 0.7 % (ref 0.0–3.0)
Eosinophils Absolute: 0.1 K/uL (ref 0.0–0.7)
Eosinophils Relative: 1.7 % (ref 0.0–5.0)
HCT: 40.2 % (ref 36.0–46.0)
Hemoglobin: 13.9 g/dL (ref 12.0–15.0)
Lymphocytes Relative: 41.6 % (ref 12.0–46.0)
Lymphs Abs: 3.1 K/uL (ref 0.7–4.0)
MCHC: 34.7 g/dL (ref 30.0–36.0)
MCV: 93.2 fl (ref 78.0–100.0)
Monocytes Absolute: 0.3 K/uL (ref 0.1–1.0)
Monocytes Relative: 3.5 % (ref 3.0–12.0)
Neutro Abs: 3.9 K/uL (ref 1.4–7.7)
Neutrophils Relative %: 52.5 % (ref 43.0–77.0)
Platelets: 292 K/uL (ref 150.0–400.0)
RBC: 4.31 Mil/uL (ref 3.87–5.11)
RDW: 12.9 % (ref 11.5–15.5)
WBC: 7.4 K/uL (ref 4.0–10.5)

## 2023-11-10 LAB — URINALYSIS, ROUTINE W REFLEX MICROSCOPIC
Bilirubin Urine: NEGATIVE
Hgb urine dipstick: NEGATIVE
Ketones, ur: NEGATIVE
Nitrite: NEGATIVE
Specific Gravity, Urine: 1.01 (ref 1.000–1.030)
Total Protein, Urine: NEGATIVE
Urine Glucose: NEGATIVE
Urobilinogen, UA: 0.2 (ref 0.0–1.0)
pH: 6.5 (ref 5.0–8.0)

## 2023-11-10 LAB — LIPID PANEL
Cholesterol: 214 mg/dL — ABNORMAL HIGH (ref 0–200)
HDL: 55.3 mg/dL (ref >39.00)
LDL Cholesterol: 135 mg/dL — ABNORMAL HIGH (ref 0–99)
NonHDL: 158.31
Total CHOL/HDL Ratio: 4
Triglycerides: 117 mg/dL (ref 0.0–149.0)
VLDL: 23.4 mg/dL (ref 0.0–40.0)

## 2023-11-10 LAB — TSH: TSH: 0.79 u[IU]/mL (ref 0.35–5.50)

## 2023-11-10 NOTE — Telephone Encounter (Signed)
 Copied from CRM [id number]. Topic: Clinical - Request for Lab/Test Order >> Nov 10, 2023 10:56 AM Heather Freeman wrote: Reason for CRM: Patient is at the lab to get the blood work she is scheduled for ,buy the orders are not put in, patients states she needs orders for blood work before her scheduled physical on Nov 5,2025

## 2023-11-10 NOTE — Progress Notes (Signed)
 Labs

## 2023-11-10 NOTE — Telephone Encounter (Signed)
 Pt was able to have blood work done.

## 2023-11-11 ENCOUNTER — Ambulatory Visit: Payer: Self-pay | Admitting: Internal Medicine

## 2023-11-18 ENCOUNTER — Ambulatory Visit (INDEPENDENT_AMBULATORY_CARE_PROVIDER_SITE_OTHER): Admitting: Internal Medicine

## 2023-11-18 VITALS — BP 118/82 | HR 83 | Temp 98.2°F | Ht 65.0 in | Wt 152.2 lb

## 2023-11-18 DIAGNOSIS — G43001 Migraine without aura, not intractable, with status migrainosus: Secondary | ICD-10-CM | POA: Diagnosis not present

## 2023-11-18 DIAGNOSIS — F900 Attention-deficit hyperactivity disorder, predominantly inattentive type: Secondary | ICD-10-CM

## 2023-11-18 DIAGNOSIS — F411 Generalized anxiety disorder: Secondary | ICD-10-CM | POA: Diagnosis not present

## 2023-11-18 DIAGNOSIS — R739 Hyperglycemia, unspecified: Secondary | ICD-10-CM | POA: Insufficient documentation

## 2023-11-18 DIAGNOSIS — J301 Allergic rhinitis due to pollen: Secondary | ICD-10-CM

## 2023-11-18 DIAGNOSIS — R233 Spontaneous ecchymoses: Secondary | ICD-10-CM

## 2023-11-18 MED ORDER — AMPHETAMINE-DEXTROAMPHETAMINE 20 MG PO TABS: 20.0000 mg | ORAL_TABLET | Freq: Two times a day (BID) | ORAL | 0 refills | Status: AC

## 2023-11-18 MED ORDER — AMPHETAMINE-DEXTROAMPHETAMINE 20 MG PO TABS: ORAL_TABLET | ORAL | 0 refills | Status: AC

## 2023-11-18 NOTE — Progress Notes (Signed)
 Subjective:  Patient ID: Heather Freeman, female    DOB: 05-14-1965  Age: 58 y.o. MRN: [id number]  CC: Annual Exam (Annual Exam)   HPI Heather Freeman presents for HTN, ADD, anxiety   Outpatient Medications Prior to Visit  Medication Sig Dispense Refill   ALPRAZolam  (XANAX ) 0.25 MG tablet Take 1-2 tablets (0.25-0.5 mg total) by mouth 2 (two) times daily as needed for anxiety. 60 tablet 2   cephALEXin  (KEFLEX ) 500 MG capsule Take 2 capsules (1,000 mg total) by mouth 2 (two) times daily. 16 capsule 1   Cholecalciferol (VITAMIN D3) 1000 UNITS tablet Take 1,000 Units by mouth daily.     GLUCOSAMINE-CHONDROITIN PO Take 1 tablet by mouth in the morning and at bedtime.     loratadine (CLARITIN) 10 MG tablet Take 10 mg by mouth daily as needed for allergies.      montelukast  (SINGULAIR ) 10 MG tablet Take 1 tablet (10 mg total) by mouth daily. 90 tablet 3   SUMAtriptan  (IMITREX ) 100 MG tablet Take 1 tablet (100 mg total) by mouth See admin instructions. Take 1 tablet by mouth at onset of migraine and may take one additional tablet two hours later, if no relief (max 2 tablets/day) 12 tablet 3   triamcinolone  0.5%-Eucerin equivalent 1:1 cream mixture Apply topically 2 (two) times daily as needed.     amphetamine -dextroamphetamine  (ADDERALL) 20 MG tablet Take 1 IN THE MORNING AND 1 AT LUNCH 60 tablet 0   amphetamine -dextroamphetamine  (ADDERALL) 20 MG tablet Take 1 tablet (20 mg total) by mouth 2 (two) times daily. TAKE 1 TABLET BY MOUTH EVERY MORNING AND 1 AT LUNCH 60 tablet 0   amphetamine -dextroamphetamine  (ADDERALL) 20 MG tablet Take 1 tablet (20 mg total) by mouth 2 (two) times daily. AM and lunch 60 tablet 0   No facility-administered medications prior to visit.    ROS: Review of Systems  Constitutional:  Negative for activity change, appetite change, chills, fatigue and unexpected weight change.  HENT:  Negative for congestion, mouth sores and sinus pressure.   Eyes:  Negative for  visual disturbance.  Respiratory:  Negative for cough and chest tightness.   Gastrointestinal:  Negative for abdominal pain and nausea.  Genitourinary:  Negative for difficulty urinating, frequency and vaginal pain.  Musculoskeletal:  Negative for back pain, gait problem and neck stiffness.  Skin:  Negative for pallor and rash.  Neurological:  Negative for dizziness, tremors, weakness, numbness and headaches.  Psychiatric/Behavioral:  Positive for decreased concentration. Negative for confusion, sleep disturbance and suicidal ideas. The patient is nervous/anxious.     Objective:  BP 118/82   Pulse 83   Temp 98.2 F (36.8 C)   Ht 5' 5 (1.651 m)   Wt 152 lb 3.2 oz (69 kg)   LMP 06/13/2012   SpO2 99%   BMI 25.33 kg/m   BP Readings from Last 3 Encounters:  11/18/23 118/82  08/05/23 116/76  11/11/22 110/62    Wt Readings from Last 3 Encounters:  11/18/23 152 lb 3.2 oz (69 kg)  08/05/23 156 lb (70.8 kg)  11/11/22 153 lb (69.4 kg)    Physical Exam Constitutional:      General: She is not in acute distress.    Appearance: Normal appearance. She is well-developed.  HENT:     Head: Normocephalic.     Right Ear: External ear normal.     Left Ear: External ear normal.     Nose: Nose normal.  Eyes:     General:  Right eye: No discharge.        Left eye: No discharge.     Conjunctiva/sclera: Conjunctivae normal.     Pupils: Pupils are equal, round, and reactive to light.  Neck:     Thyroid : No thyromegaly.     Vascular: No JVD.     Trachea: No tracheal deviation.  Cardiovascular:     Rate and Rhythm: Normal rate and regular rhythm.     Heart sounds: Normal heart sounds.  Pulmonary:     Effort: No respiratory distress.     Breath sounds: No stridor. No wheezing.  Abdominal:     General: Bowel sounds are normal. There is no distension.     Palpations: Abdomen is soft. There is no mass.     Tenderness: There is no abdominal tenderness. There is no guarding or  rebound.  Musculoskeletal:        General: No tenderness.     Cervical back: Normal range of motion and neck supple. No rigidity.     Right lower leg: No edema.     Left lower leg: No edema.  Lymphadenopathy:     Cervical: No cervical adenopathy.  Skin:    Findings: No erythema or rash.  Neurological:     Mental Status: She is oriented to person, place, and time.     Cranial Nerves: No cranial nerve deficit.     Motor: No abnormal muscle tone.     Coordination: Coordination normal.     Gait: Gait normal.     Deep Tendon Reflexes: Reflexes normal.  Psychiatric:        Behavior: Behavior normal.        Thought Content: Thought content normal.        Judgment: Judgment normal.     Lab Results  Component Value Date   WBC 7.4 11/10/2023   HGB 13.9 11/10/2023   HCT 40.2 11/10/2023   PLT 292.0 11/10/2023   GLUCOSE 139 (H) 11/10/2023   CHOL 214 (H) 11/10/2023   TRIG 117.0 11/10/2023   HDL 55.30 11/10/2023   LDLDIRECT 101.3 11/09/2012   LDLCALC 135 (H) 11/10/2023   ALT 11 11/10/2023   AST 19 11/10/2023   NA 138 11/10/2023   K 4.1 11/10/2023   CL 103 11/10/2023   CREATININE 0.76 11/10/2023   BUN 14 11/10/2023   CO2 28 11/10/2023   TSH 0.79 11/10/2023   INR 1.1 07/19/2019    MM 3D SCREENING MAMMOGRAM BILATERAL BREAST Result Date: 03/28/2023 CLINICAL DATA:  Screening. EXAM: DIGITAL SCREENING BILATERAL MAMMOGRAM WITH TOMOSYNTHESIS AND CAD TECHNIQUE: Bilateral screening digital craniocaudal and mediolateral oblique mammograms were obtained. Bilateral screening digital breast tomosynthesis was performed. The images were evaluated with computer-aided detection. COMPARISON:  Previous exam(s). ACR Breast Density Category b: There are scattered areas of fibroglandular density. FINDINGS: There are no findings suspicious for malignancy. IMPRESSION: No mammographic evidence of malignancy. A result letter of this screening mammogram will be mailed directly to the patient. RECOMMENDATION:  Screening mammogram in one year. (Code:SM-B-01Y) BI-RADS CATEGORY  1: Negative. Electronically Signed   By: Rosaline Collet M.D.   On: 03/28/2023 12:14    Assessment & Plan:   Problem List Items Addressed This Visit     Allergic rhinitis   Nasonex  - not covered this year. Needs a replacement      Attention deficit disorder - Primary   On Adderall  Potential benefits of a long term amphetamines use as well as potential risks  and complications were  explained to the patient and were aknowledged.      Generalized anxiety disorder   Xanax  prn-rare use  Potential benefits of a long term benzodiazepines  use as well as potential risks  and complications were explained to the patient and were aknowledged.      Hyperglycemia   Mild - will watch       Migraine headache   No recent HAs      RESOLVED: Petechial rash   Resolved on Vit C Bruising - better on Vit C         Meds ordered this encounter  Medications   amphetamine -dextroamphetamine  (ADDERALL) 20 MG tablet    Sig: Take 1 IN THE MORNING AND 1 AT LUNCH    Dispense:  60 tablet    Refill:  0    Please fill on or after 11/19/23. Code: F98.8   amphetamine -dextroamphetamine  (ADDERALL) 20 MG tablet    Sig: Take 1 tablet (20 mg total) by mouth 2 (two) times daily. TAKE 1 TABLET BY MOUTH EVERY MORNING AND 1 AT LUNCH    Dispense:  60 tablet    Refill:  0    Please fill on or after 12/19/23. Code: F98.8   amphetamine -dextroamphetamine  (ADDERALL) 20 MG tablet    Sig: Take 1 tablet (20 mg total) by mouth 2 (two) times daily. AM and lunch    Dispense:  60 tablet    Refill:  0    Please fill on or after 01/18/24. Code: F98.8      Follow-up: Return in about 4 months (around 03/17/2024) for a follow-up visit.  Marolyn Noel, MD

## 2023-11-18 NOTE — Assessment & Plan Note (Signed)
 Mild--will watch

## 2023-11-18 NOTE — Assessment & Plan Note (Signed)
Nasonex - not covered this year. Needs a replacement

## 2023-11-18 NOTE — Assessment & Plan Note (Signed)
 No recent HAs

## 2023-11-18 NOTE — Assessment & Plan Note (Addendum)
 Resolved on Vit C Bruising - better on Vit C

## 2023-11-18 NOTE — Assessment & Plan Note (Signed)
Xanax prn rare use  Potential benefits of a long term benzodiazepines  use as well as potential risks  and complications were explained to the patient and were aknowledged.  

## 2023-11-18 NOTE — Assessment & Plan Note (Signed)
 On Adderall  Potential benefits of a long term amphetamines  use as well as potential risks  and complications were explained to the patient and were aknowledged.
